# Patient Record
Sex: Female | Born: 1940 | Race: White | Hispanic: No | Marital: Married | Smoking: Never smoker
Health system: Southern US, Community
[De-identification: ages and names within clinical notes are randomized; demographics above are authoritative.]

## PROBLEM LIST (undated history)

## (undated) DIAGNOSIS — I428 Other cardiomyopathies: Secondary | ICD-10-CM

## (undated) DIAGNOSIS — F0390 Unspecified dementia without behavioral disturbance: Secondary | ICD-10-CM

## (undated) DIAGNOSIS — I272 Pulmonary hypertension, unspecified: Secondary | ICD-10-CM

## (undated) DIAGNOSIS — I499 Cardiac arrhythmia, unspecified: Secondary | ICD-10-CM

## (undated) DIAGNOSIS — G3184 Mild cognitive impairment, so stated: Secondary | ICD-10-CM

## (undated) DIAGNOSIS — E785 Hyperlipidemia, unspecified: Secondary | ICD-10-CM

## (undated) DIAGNOSIS — I10 Essential (primary) hypertension: Secondary | ICD-10-CM

## (undated) DIAGNOSIS — F418 Other specified anxiety disorders: Secondary | ICD-10-CM

## (undated) DIAGNOSIS — R42 Dizziness and giddiness: Secondary | ICD-10-CM

## (undated) HISTORY — DX: Other cardiomyopathies: I42.8

## (undated) HISTORY — PX: APPENDECTOMY: SHX54

## (undated) HISTORY — DX: Hyperlipidemia, unspecified: E78.5

## (undated) HISTORY — PX: ABDOMINAL HYSTERECTOMY: SHX81

## (undated) HISTORY — PX: CHOLECYSTECTOMY: SHX55

## (undated) HISTORY — DX: Other specified anxiety disorders: F41.8

## (undated) HISTORY — DX: Pulmonary hypertension, unspecified: I27.20

## (undated) HISTORY — DX: Essential (primary) hypertension: I10

## (undated) HISTORY — PX: EYE SURGERY: SHX253

---

## 1998-10-26 ENCOUNTER — Other Ambulatory Visit: Admission: RE | Admit: 1998-10-26 | Discharge: 1998-10-26 | Payer: Self-pay | Admitting: Family Medicine

## 2000-11-17 ENCOUNTER — Other Ambulatory Visit: Admission: RE | Admit: 2000-11-17 | Discharge: 2000-11-17 | Payer: Self-pay | Admitting: Family Medicine

## 2001-02-12 ENCOUNTER — Ambulatory Visit: Admission: RE | Admit: 2001-02-12 | Discharge: 2001-02-12 | Payer: Self-pay | Admitting: Pulmonary Disease

## 2001-04-08 ENCOUNTER — Ambulatory Visit (HOSPITAL_COMMUNITY): Admission: RE | Admit: 2001-04-08 | Discharge: 2001-04-08 | Payer: Self-pay | Admitting: *Deleted

## 2002-01-01 ENCOUNTER — Ambulatory Visit (HOSPITAL_COMMUNITY): Admission: RE | Admit: 2002-01-01 | Discharge: 2002-01-01 | Payer: Self-pay | Admitting: Gastroenterology

## 2002-01-01 ENCOUNTER — Encounter: Payer: Self-pay | Admitting: Gastroenterology

## 2002-04-13 ENCOUNTER — Other Ambulatory Visit: Admission: RE | Admit: 2002-04-13 | Discharge: 2002-04-13 | Payer: Self-pay | Admitting: Family Medicine

## 2002-09-20 ENCOUNTER — Encounter: Payer: Self-pay | Admitting: Gastroenterology

## 2002-09-20 ENCOUNTER — Encounter: Admission: RE | Admit: 2002-09-20 | Discharge: 2002-09-20 | Payer: Self-pay | Admitting: Gastroenterology

## 2003-05-23 ENCOUNTER — Other Ambulatory Visit: Admission: RE | Admit: 2003-05-23 | Discharge: 2003-05-23 | Payer: Self-pay | Admitting: Family Medicine

## 2003-10-26 ENCOUNTER — Encounter: Admission: RE | Admit: 2003-10-26 | Discharge: 2003-10-26 | Payer: Self-pay | Admitting: Gastroenterology

## 2004-09-20 ENCOUNTER — Ambulatory Visit (HOSPITAL_COMMUNITY): Admission: RE | Admit: 2004-09-20 | Discharge: 2004-09-20 | Payer: Self-pay | Admitting: Orthopedic Surgery

## 2005-02-21 ENCOUNTER — Ambulatory Visit: Payer: Self-pay | Admitting: Internal Medicine

## 2005-03-12 ENCOUNTER — Ambulatory Visit: Payer: Self-pay | Admitting: Internal Medicine

## 2005-08-26 ENCOUNTER — Ambulatory Visit: Payer: Self-pay | Admitting: Internal Medicine

## 2006-02-26 ENCOUNTER — Ambulatory Visit: Payer: Self-pay | Admitting: Internal Medicine

## 2006-04-24 ENCOUNTER — Ambulatory Visit: Payer: Self-pay | Admitting: Internal Medicine

## 2006-06-06 ENCOUNTER — Encounter (INDEPENDENT_AMBULATORY_CARE_PROVIDER_SITE_OTHER): Payer: Self-pay | Admitting: Specialist

## 2006-06-06 ENCOUNTER — Ambulatory Visit (HOSPITAL_COMMUNITY): Admission: RE | Admit: 2006-06-06 | Discharge: 2006-06-06 | Payer: Self-pay | Admitting: General Surgery

## 2006-09-29 ENCOUNTER — Ambulatory Visit: Payer: Self-pay | Admitting: Internal Medicine

## 2006-11-18 LAB — HM COLONOSCOPY

## 2007-08-05 ENCOUNTER — Ambulatory Visit: Payer: Self-pay | Admitting: Cardiology

## 2007-08-10 ENCOUNTER — Encounter: Payer: Self-pay | Admitting: Cardiology

## 2007-08-10 ENCOUNTER — Ambulatory Visit: Payer: Self-pay

## 2008-04-15 ENCOUNTER — Ambulatory Visit: Payer: Self-pay | Admitting: Internal Medicine

## 2008-10-24 ENCOUNTER — Ambulatory Visit: Payer: Self-pay | Admitting: Internal Medicine

## 2008-10-31 ENCOUNTER — Telehealth (INDEPENDENT_AMBULATORY_CARE_PROVIDER_SITE_OTHER): Payer: Self-pay | Admitting: *Deleted

## 2008-11-01 ENCOUNTER — Encounter: Payer: Self-pay | Admitting: Cardiology

## 2008-11-01 ENCOUNTER — Ambulatory Visit: Payer: Self-pay

## 2008-11-01 ENCOUNTER — Encounter: Payer: Self-pay | Admitting: Internal Medicine

## 2009-02-14 ENCOUNTER — Telehealth: Payer: Self-pay | Admitting: Internal Medicine

## 2009-07-21 ENCOUNTER — Ambulatory Visit: Payer: Self-pay | Admitting: Internal Medicine

## 2009-07-21 DIAGNOSIS — E7800 Pure hypercholesterolemia, unspecified: Secondary | ICD-10-CM | POA: Insufficient documentation

## 2009-07-21 DIAGNOSIS — R079 Chest pain, unspecified: Secondary | ICD-10-CM | POA: Insufficient documentation

## 2010-01-12 ENCOUNTER — Emergency Department (HOSPITAL_COMMUNITY): Admission: EM | Admit: 2010-01-12 | Discharge: 2010-01-13 | Payer: Self-pay | Admitting: Emergency Medicine

## 2010-08-08 LAB — HM DEXA SCAN: HM Dexa Scan: NORMAL

## 2010-08-16 ENCOUNTER — Encounter: Payer: Self-pay | Admitting: Internal Medicine

## 2010-08-16 ENCOUNTER — Ambulatory Visit: Payer: Self-pay | Admitting: Internal Medicine

## 2010-08-16 DIAGNOSIS — I428 Other cardiomyopathies: Secondary | ICD-10-CM

## 2010-08-23 ENCOUNTER — Telehealth: Payer: Self-pay | Admitting: Internal Medicine

## 2010-08-23 LAB — CONVERTED CEMR LAB
Cholesterol: 209 mg/dL — ABNORMAL HIGH (ref 0–200)
HDL: 48.2 mg/dL (ref >39.00)

## 2010-09-16 LAB — CONVERTED CEMR LAB
Alkaline Phosphatase: 55 units/L (ref 39–117)
Bilirubin, Direct: 0 mg/dL (ref 0.0–0.3)
Chloride: 103 meq/L (ref 96–112)
LDL Cholesterol: 88 mg/dL (ref 0–99)
Sodium: 143 meq/L (ref 135–145)
Total Bilirubin: 1.5 mg/dL — ABNORMAL HIGH (ref 0.3–1.2)
Total CHOL/HDL Ratio: 3
Total Protein: 7.4 g/dL (ref 6.0–8.3)
Triglycerides: 87 mg/dL (ref 0.0–149.0)

## 2010-09-20 NOTE — Assessment & Plan Note (Signed)
Summary: yearly/sl   History of Present Illness: Patient is a 70 year old with a history of hypertension, dyslipidemia, mild LV dysfunction.  I saw her 1 year ago.  Note, she had a myoview in 2010  that showed no ischeima.  LVEF was  50 to 55%. Since I saw her in 2010 she has noted some mild epigastric pain that has been constant.  Mild pleuritic component.   Does note some reflux as well. Otherwise, no other chest pains.  Breahting is good.  No palpitatons.  Current Medications (verified): 1)  Diovan 80 Mg Tabs (Valsartan) .Marland Kitchen.. 1 By Mouth Daiy 2)  Furosemide 20 Mg Tabs (Furosemide) .Marland Kitchen.. 1 By Mouth Daily 3)  Simvastatin 40 Mg Tabs (Simvastatin) .Marland Kitchen.. 1 By Mouth Daily 4)  Vitamin D 1000 Unit Tabs (Cholecalciferol) .Marland Kitchen.. 1 By Mouth Daily 5)  Aspirin 81 Mg  Tabs (Aspirin) .Marland Kitchen.. 1 By Mouth Daily  Allergies (verified): No Known Drug Allergies  Past History:  Past medical, surgical, family and social histories (including risk factors) reviewed, and no changes noted (except as noted below).  Past Medical History: Nonischimic cardiomyopaty Normal coronary arteries on cath 2002dyslipidemia Echo 2010:  LVEF 50 to 55%. hypertension GERD  Past Surgical History: Choley  Family History: Reviewed history and no changes required.  Social History: Reviewed history and no changes required. No tobacco.  Review of Systems       All systems reviewed.  NEg to the above problem except as noted above.  Vital Signs:  Patient profile:   70 year old female Height:      62 inches Weight:      151 pounds BMI:     27.72 Pulse rate:   64 / minute Resp:     16 per minute BP sitting:   122 / 70  (right arm)  Vitals Entered By: Marrion Coy, CNA (August 16, 2010 10:15 AM)  Physical Exam  Additional Exam:  Patient is in NAD HEENT:  Normocephalic, atraumatic. EOMI, PERRLA.  Neck: JVP is normal. No thyromegaly. No bruits.  Lungs: clear to auscultation. No rales no wheezes.  Heart: Regular rate  and rhythm. Normal S1, S2. No S3.   No significant murmurs. PMI not displaced.  Abdomen:  Supple.  Mild epigastric tenderness. Normal bowel sounds. No masses. No hepatomegaly.  Extremities:   Good distal pulses throughout. No lower extremity edema.  Musculoskeletal :moving all extremities.  Neuro:   alert and oriented x3.    EKG  Procedure date:  08/16/2010  Findings:      NSR.  64 bpm   Low voltage.  Septal MI  Impression & Recommendations:  Problem # 1:  CARDIOMYOPATHY (ICD-425.4) LVEF was low normal on echo in 2010.  I would keep on same regimen.  Check labs today.  Exam is good Her updated medication list for this problem includes:    Diovan 80 Mg Tabs (Valsartan) .Marland Kitchen... 1 by mouth daiy    Furosemide 20 Mg Tabs (Furosemide) .Marland Kitchen... 1 by mouth daily    Aspirin 81 Mg Tabs (Aspirin) .Marland Kitchen... 1 by mouth daily  Her updated medication list for this problem includes:    Diovan 80 Mg Tabs (Valsartan) .Marland Kitchen... 1 by mouth daiy    Furosemide 20 Mg Tabs (Furosemide) .Marland Kitchen... 1 by mouth daily    Aspirin 81 Mg Tabs (Aspirin) .Marland Kitchen... 1 by mouth daily  Problem # 2:  HYPERTENSION, BENIGN (ICD-401.1)  Adeq control Her updated medication list for this problem includes:  Diovan 80 Mg Tabs (Valsartan) .Marland Kitchen... 1 by mouth daiy    Furosemide 20 Mg Tabs (Furosemide) .Marland Kitchen... 1 by mouth daily    Aspirin 81 Mg Tabs (Aspirin) .Marland Kitchen... 1 by mouth daily  Her updated medication list for this problem includes:    Diovan 80 Mg Tabs (Valsartan) .Marland Kitchen... 1 by mouth daiy    Furosemide 20 Mg Tabs (Furosemide) .Marland Kitchen... 1 by mouth daily    Aspirin 81 Mg Tabs (Aspirin) .Marland Kitchen... 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 Her updated medication list for this problem includes:    Simvastatin 40 Mg Tabs (Simvastatin) .Marland Kitchen... 1 by mouth daily  Orders: TLB-Lipid Panel (80061-LIPID) TLB-AST (SGOT) (84450-SGOT)  Her updated medication list for this problem includes:    Simvastatin 40 Mg Tabs  (Simvastatin) .Marland Kitchen... 1 by mouth daily  Problem # 4:  CHEST PAIN UNSPECIFIED (ICD-786.50) APpears non cardiac.  ? GI  ? muscular.  Rx with omeprazole. Her updated medication list for this problem includes:    Aspirin 81 Mg Tabs (Aspirin) .Marland Kitchen... 1 by mouth daily  Orders: EKG w/ Interpretation (93000)  Patient Instructions: 1)  Your physician recommends that you return for a FASTING lipid profile: lipid and ast today...we will call you with results 2)  Your physician wants you to follow-up in: 12 months  You will receive a reminder letter in the mail two months in advance. If you don't receive a letter, please call our office to schedule the follow-up appointment. Prescriptions: OMEPRAZOLE 40 MG CPDR (OMEPRAZOLE) 1 every day  #30 x 11   Entered by:   Layne Benton, RN, BSN   Authorized by:   Sherrill Raring, MD, [facility]   Signed by:   Layne Benton, RN, BSN on 08/16/2010   Method used:   Electronically to        [facility] (retail)       [address]       [place], [place]  [address]       Ph: [phone]       Fax: [phone]   RxID:   [id number]

## 2010-09-20 NOTE — Progress Notes (Signed)
Summary: pt rtn call  Phone Note Call from Patient Call back at [phone]   Caller: Patient Reason for Call: Talk to Nurse, Talk to Doctor Summary of Call: pt rtn call  Initial call taken by: Omer Jack,  August 23, 2010 2:41 PM  Follow-up for Phone Call        Phone Call Completed PT AWARE WILL TRY CRESTOR AND WILL HAVE REPEAT LABS DONE  BEGINNING OF MARCH 2012. Follow-up by: Scherrie Bateman, LPN,  August 23, 2010 2:49 PM

## 2010-10-22 ENCOUNTER — Other Ambulatory Visit: Payer: Self-pay | Admitting: Internal Medicine

## 2010-10-22 ENCOUNTER — Encounter: Payer: Self-pay | Admitting: Internal Medicine

## 2010-10-22 ENCOUNTER — Other Ambulatory Visit (INDEPENDENT_AMBULATORY_CARE_PROVIDER_SITE_OTHER): Payer: Medicare Other

## 2010-10-22 DIAGNOSIS — Z79899 Other long term (current) drug therapy: Secondary | ICD-10-CM

## 2010-10-22 DIAGNOSIS — E785 Hyperlipidemia, unspecified: Secondary | ICD-10-CM

## 2010-10-22 LAB — LIPID PANEL
Cholesterol: 119 mg/dL (ref 0–200)
LDL Cholesterol: 58 mg/dL (ref 0–99)
Total CHOL/HDL Ratio: 3
Triglycerides: 73 mg/dL (ref 0.0–149.0)
VLDL: 14.6 mg/dL (ref 0.0–40.0)

## 2010-11-21 ENCOUNTER — Telehealth: Payer: Self-pay | Admitting: Internal Medicine

## 2010-11-21 NOTE — Telephone Encounter (Signed)
Pt needs crestor and  Omeprazole for 90 day supply [facility]

## 2010-11-23 ENCOUNTER — Other Ambulatory Visit: Payer: Self-pay

## 2010-11-23 MED ORDER — ROSUVASTATIN CALCIUM 10 MG PO TABS: 10.0000 mg | ORAL_TABLET | Freq: Every day | ORAL | Status: DC

## 2010-11-23 MED ORDER — OMEPRAZOLE 40 MG PO CPDR: 40.0000 mg | DELAYED_RELEASE_CAPSULE | Freq: Every day | ORAL | Status: DC

## 2010-11-28 ENCOUNTER — Other Ambulatory Visit: Payer: Self-pay | Admitting: *Deleted

## 2010-11-28 MED ORDER — OMEPRAZOLE 40 MG PO CPDR: 40.0000 mg | DELAYED_RELEASE_CAPSULE | Freq: Every day | ORAL | Status: DC

## 2010-12-12 ENCOUNTER — Encounter: Payer: Self-pay | Admitting: Nurse Practitioner

## 2010-12-12 DIAGNOSIS — F418 Other specified anxiety disorders: Secondary | ICD-10-CM | POA: Insufficient documentation

## 2010-12-12 DIAGNOSIS — I272 Pulmonary hypertension, unspecified: Secondary | ICD-10-CM | POA: Insufficient documentation

## 2010-12-12 DIAGNOSIS — G43909 Migraine, unspecified, not intractable, without status migrainosus: Secondary | ICD-10-CM | POA: Insufficient documentation

## 2011-01-01 NOTE — Assessment & Plan Note (Signed)
[facility]                            CARDIOLOGY OFFICE NOTE   NAME:[name], Kelly TRICK                     MRN:          [id number]  DATE:04/15/2008                            DOB:          03-Jul-1941    IDENTIFICATION:  Ms. Heacox is a 70 year old woman I last saw her back  in December of last year.  Actually, she was seen last by Dr. Rollene Rotunda.  I saw her in the February before and want to be seen more  locally.  She lives in [place].  She comes back today for continued  care.  Would like to be seen here in the future.   She denies chest pain, notes some shortness of breath with activity,  takes activities as tolerated.  No real change.   CURRENT MEDICINES:  1. Lasix 20.  2. Diovan 80.  3. Aspirin 81.  4. Omega-3.   Note, the patient was taken off for statins by Dr. Lindaann Pascal.   PHYSICAL EXAMINATION:  GENERAL:  The patient is in no distress.  VITAL SIGNS:  Blood pressure is 120/66, pulse is 64, weight 150, up 12  pounds from December.  NECK:  JVP is normal.  LUNGS:  Clear.  No rales.  CARDIAC:  Regular rate and rhythm.  S1 and S2.  No S3.  No significant  murmurs.  ABDOMEN:  No hepatomegaly.  Supple.  EXTREMITIES:  Good pulses.  No significant edema.   A 12-lead EKG, normal sinus rhythm, 64 beats per minute, low voltage.   IMPRESSION:  1. Left ventricular dysfunction.  Last echocardiogram left ventricular      ejection fraction was 50%, doing well on medical therapy.  2. Dyslipidemia.  With her left ventricular being minimally down, I      would favor treating.  I have given a prescription for Zocor.  She      did well as Vytorin, did not tolerate other statins.  Her insurance      will not pay for Vytorin.  We will go ahead and check lipids in      about 8-10 weeks.   I will set follow up otherwise for this summer, sooner if problems  develop.     Pricilla Riffle, MD, [facility]  Electronically Signed    PVR/MedQ  DD:  04/15/2008  DT: 04/16/2008  Job #: [id number]   cc:   Dr. Lindaann Pascal

## 2011-01-01 NOTE — Assessment & Plan Note (Signed)
[facility]                            CARDIOLOGY OFFICE NOTE   NAME:[name], [name]                     MRN:          [id number]  DATE:08/05/2007                            DOB:          May 27, 1941    PRIMARY CARE PHYSICIAN:  Kelly [name].   REASON FOR PRESENTATION:  Evaluate patient with cardiomyopathy.   HISTORY OF PRESENT ILLNESS:  The patient is a 70 year old, who was seen  in the past by Dr. Gerri [name] and then by Dr. Tenny [name] for shortness of  breath.  She was noted in the past to have an ejection fraction of 45 to  50%.  However, cardiac catheterization demonstrated normal coronaries.  She did have mildly elevated pulmonary pressures.  She was last followed  with an echocardiogram in 2005.  This demonstrated the EF to be about  50%.  There was mild aortic sclerosis.  There were no significant  valvular abnormalities.  Her TR velocity was 2.1 suggesting actual  normal pulmonary pressures.   The patient is now following up here as she lives here.  She says that  she will occasionally get some shortness of breath.  She will notice  this sometimes in the morning.  However, this is not a problem with  activity.  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  She rides  the lawnmower, but she still has to do some lifting and other things.  She does not get particularly short of breath with this and does not  describe any PND or orthopnea.  She does not have any chest discomfort,  neck or arm discomfort.  She has had no palpitations and no presyncope  or syncope.   PAST MEDICAL HISTORY:  1. Nonischemic cardiomyopathy (EF about 50%).  2. 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  3. Hypertension.  4. Dyslipidemia.  5. Migraine headaches.  6. TAH.  7. Oophorectomy.  8. Appendectomy.  9. Cataract surgery.  10.Sebaceous cyst resected.  11.Cholecystectomy.   ALLERGIES/INTOLERANCES:  None.   MEDICATIONS:  1. Lasix 20 mg daily.  2. Diovan 80 mg  daily.  3. Aspirin 81 mg daily.  4. Crestor 10 mg daily.  5. Lexapro 10 mg daily.   REVIEW OF SYSTEMS:  As stated in the HPI and otherwise negative for  other systems.   PHYSICAL EXAMINATION:  GENERAL:  The patient is in no distress.  VITAL SIGNS:  Blood pressure 120/70, heart rate is 70 and regular.  HEENT:  Eyelids unremarkable, pupils equal, round, and react to light,  fundi not visualized, oral mucosa unremarkable.  NECK:  No jugulovenous distention at 45 degrees, carotid upstroke brisk  and symmetric, no bruits, no thyromegaly.  LYMPHATICS:  No cervical, axillary, or inguinal adenopathy.  LUNGS:  Clear to auscultation bilaterally.  BACK:  No costovertebral angle tenderness.  CHEST:  Unremarkable.  HEART:  PMI nondisplaced or sustained, S1 and S2 within normal limits,  no S3, no S4, no clicks, no rubs, no murmurs.  ABDOMEN:  Flat, positive bowel sounds normal in frequency and pitch, no  bruits, no rebound, no guarding, no midline  pulsatile mass, no  organomegaly.  SKIN:  No rashes, no nodules.  EXTREMITIES:  2+ pulses, no edema.   EKG:  Sinus rhythm, rate 77, low voltage in the limb leads, axis within  normal limits, poor anterior R-wave progression, cannot exclude an old  anteroseptal infarct, no acute ST-T wave changes.   ASSESSMENT AND PLAN:  1. Cardiomyopathy.  The patient did have a mild cardiomyopathy.  She      does have some occasional dyspnea.  She also had some mild      pulmonary hypertension by catheterization.  To follow up this, we      will get an echocardiogram though I do not clinically suspect a      significant change.  I do note that she has the electrocardiogram      with the findings as above.  However, these are not significantly      changed from previous.  2. Dyslipidemia per Kelly [name].  3. Hypertension.  Her blood pressure is well-controlled and she will      continue the medications as listed.  4. Followup.  I would like to see her yearly or as  needed based on the      results of the above testing.     Kelly Rotunda, MD, [facility]  Electronically Signed    JH/MedQ  DD: 08/05/2007  DT: 08/05/2007  Job #: [id number]   cc:   Kelly [name], Mr.

## 2011-01-01 NOTE — Assessment & Plan Note (Signed)
[facility]                            CARDIOLOGY OFFICE NOTE   NAME:[name], Kelly TAGLIAFERRO                     MRN:          [id number]  DATE:10/24/2008                            DOB:          02-04-1941    IDENTIFICATION:  Kelly [name] is a 19 (seem to be 55) year-old woman I  last saw her back in August.  She has a history of dyslipidemia and mild  LV dysfunction.   Since seen, she actually went to her primary physician and she described  feeling of more shortness of breath.  She has also had episodes of chest  pain, left-sided, sometimes radiating towards her back.  Nonpleuritic,  they can wake her from sleep.  She notes occasional reflux, but not  severe, takes Prilosec.  The pain can also occur at rest.  She does not  notice an association particularly with exertion, though she has again  with the weather cut back on her activity some.  She does say that she  just feels like she is getting older.   CURRENT MEDICINES:  1. Lasix 20.  2. Diovan 80.  3. Aspirin 81.  4. Simvastatin 40.   PHYSICAL EXAMINATION:  GENERAL:  The patient is in no distress.  VITAL SIGNS:  Blood pressure is 112/61; pulse is 81 and regular; weight  153, which is up 3 pounds from August.  NECK:  JVP is normal.  LUNGS:  Clear.  Moving air well.  No rales or wheezes.  CHEST:  There is some tenderness on the left side with palpation, but  this is not the pain that she is experiencing at night.  CARDIAC:  Regular rate and rhythm, S1-S2, no S3.  No significant  murmurs.  ABDOMEN:  Supple, nontender.  Normal bowel sounds.  EXTREMITIES:  Good distal pulses.  No significant edema.   A 12-lead EKG, low voltage.  Normal sinus rhythm at 76 beats per minute.  Possible anterior MI, these changes are old.   LABORATORIES:  From [facility], LDL of 64, HDL  of 56, and triglycerides 78.   IMPRESSION:  1. Chest pain, shortness of breath, atypical for coronary  artery      disease, but concerning is I cannot explain of anything else and      she has cut back on her activity.  Now she had normal coronary      arteries by cath in 2002.  Her LV function has improved on echo      back in 2008.   What I would recommend is that the patient to go ahead and do another  exercise treadmill.  In addition, we will get an echocardiogram to  define.  Continue current medicines for now.  1. Hypertension, good control.  2. Dyslipidemia, excellent numbers.   I will set follow up for 9 months, but again be in touch with her once I  have seen the test results.     Pricilla Riffle, MD, [facility]  Electronically Signed    PVR/MedQ  DD: 10/24/2008  DT: 10/24/2008  Job #: [id number]  cc:   [facility]

## 2011-01-04 NOTE — Assessment & Plan Note (Signed)
[facility]                              CARDIOLOGY OFFICE NOTE   NAME:Drumheller, JACKILYN UMPHLETT                     MRN:          [id number]  DATE:04/24/2006                            DOB:          12-Oct-1940    IDENTIFICATION:  Ms. Utke is a 70 year old woman with mild LV  dysfunction, LV of 50% back in 2005.  Note, cardiac catheterization in 2002  showed no significant CAD.  LVEF at the time was 55%.   I saw the patient back in July.  She was doing well.  I actually felt like  she could be discontinued from cardiology followup unless her symptoms  change.  She does need followup, though, for some mild dyslipidemia.  She  has been started on Vytorin.  When I saw her, she had some epigastric pain.  Indeed, the workup showed she has significant cholelithiasis, and the plan  is for cholecystectomy in the near future.   The patient has been active.  She denies chest pain.  Abdominal pain with  some foods.   CURRENT MEDICATIONS:  1. Aspirin, on hold.  2. Lasix 20 daily.  3. Diovan 80 daily.  4. Vytorin 10/10 daily.  5. Allegra daily.  6. Tagamet t.i.d.  7. Vistaril nightly.   PHYSICAL EXAMINATION:  VITAL SIGNS:  Blood pressure 110/60, pulse 80 and  regular.  Weight 143.  GENERAL:  Patient is in no distress.  LUNGS:  Clear.  NECK:  JVP is normal.  CARDIAC:  Regular rate and rhythm.  S1 and S2.  No S3.  ABDOMEN:  Benign.  EXTREMITIES:  No edema.  Pulses 2+.   IMPRESSION:  1. History of left ventricular dysfunction, really minimally depressed.      Would continue current regimen.  2. Dyslipidemia.  Begin Vytorin therapy.  I think this will be important      to minimize the risks for other cardiac problems developing.   I will set followup for July of this next year.  Again, we will follow her  lipids in the interval.  I think overall she is at low risk.  She is a class  I patient.  She should be able to proceed with surgery with very low risk  of  problems.  No further testing indicated.                                Pricilla Riffle, MD, [facility]    PVR/MedQ  DD:  04/24/2006  DT:  04/24/2006  Job #:  [id number]   cc:   Ernestina Penna, M.D.  Petra Kuba, M.D.  Cherylynn Ridges, M.D.

## 2011-01-04 NOTE — Cardiovascular Report (Signed)
[facility]  Patient:    Kelly [name], Kelly [name] Visit Number: [id number] MRN: [id number]          Service Type: CAT Location: [facility] 2899 [id number] Attending Physician:  Daisey Must Proc. Date: 04/08/01 Adm. Date:  04/08/2001   CC:         Monica Becton, M.D.  Cardiac Catheterization Laboratory   Cardiac Catheterization  PROCEDURES PERFORMED:  Right and left heart catheterization with coronary angiography and left ventriculography.  INDICATIONS:  The patient is a 70 year old woman, with a history of exertional dyspnea.  An echocardiogram showed ejection fraction of 45% with septal hypokinesis.  A stress Cardiolite showed no ischemia but confirmed an ejection fraction of approximately 44%.  Also by echocardiogram, she had suggestion of moderate pulmonary hypertension.  We opted to proceed with cardiac catheterization to assess the etiology of her left ventricular dysfunction and rule out coronary artery and assess the severity of her pulmonary hypertension.  DESCRIPTION OF PROCEDURE:  An 8 French sheath was placed in the right femoral vein, 6 French sheath in the right femoral artery.  Right heart catheterization was performed with a Swan-Ganz catheter.  Left heart catheterization was performed with Standard Judkins 6 French catheters. Contrast was Omnipaque.  There were no complications.  RESULTS:  HEMODYNAMICS:  Right atrial mean pressure 2, right ventricular pressure 34/6, pulmonary artery pressure 32/14.  Pulmonary capillary wedge pressures A 14, V 14, mean 10.  Left ventricular pressure 148/20.  Aortic pressure 146/70. There was no aortic valve gradient.  Cardiac output by the thermodilution method is 3.4, cardiac index 2.0. Cardiac output by the Fick method is 3.2.  Cardiac index 1.9.  LEFT VENTRICULOGRAM:  There is mild global hypokinesis.  Ejection fraction is calculated at 55%.  There is 2+ mild mitral regurgitation.  CORONARY  ARTERIOGRAPHY:  (Codominant).  Left main:  Normal.  Left anterior descending:  The left anterior descending artery gives rise to a large first diagonal branch.  The LAD is normal.  Left circumflex:  The left circumflex is a codominant vessel giving rise to a small ramus intermedius, a large branching obtuse marginal and three small posterolateral branches.  The left circumflex is angiographically normal.  Right coronary artery:  The right coronary artery is a relatively small codominant vessel ending as a small posterior descending artery.  The right coronary artery is angiographically normal.  IMPRESSIONS: 1. Pulmonary artery pressures at the upper limits of normal with normal    right and left heart filling pressures. 2. Mildly decreased cardiac output. 3. Mildly decreased left ventricular systolic function secondary to    nonischemic cardiomyopathy.  Mild mitral regurgitation. 4. Normal coronary arteries.  PLAN:  The patient will be continued on medical therapy. Attending Physician:  Daisey Must DD:  04/08/01 TD:  04/08/01 Job: [id number]

## 2011-01-04 NOTE — Op Note (Signed)
NAME:  Kelly [name], Kelly [name]              ACCOUNT NO.:  [id number]   MEDICAL RECORD NO.:  [id number]          PATIENT TYPE:  AMB   LOCATION:  SDS                          FACILITY:  [facility]   PHYSICIAN:  Cherylynn Ridges, M.D.    DATE OF BIRTH:  13-Mar-1941   DATE OF PROCEDURE:  DATE OF DISCHARGE:                                 OPERATIVE REPORT   PREOPERATIVE DIAGNOSIS:  Symptomatic cholelithiasis.   POSTOPERATIVE DIAGNOSIS:  Symptomatic cholelithiasis.   PROCEDURE:  Laparoscopic cholecystectomy with cholangiogram.   SURGEON:  Dr. Jimmye Norman   ANESTHESIA:  General endotracheal.   ESTIMATED BLOOD LOSS:  Less than 20 mL.   COMPLICATIONS:  None.   CONDITION:  Stable.   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  Some  omental adhesions to the gallbladder infundibulum and body.  Normal  cholangiogram with good flow into the duodenum.  No filling defects.  No  dilatation and good proximal flow.   OPERATION:  The patient was taken to the operating room and placed on the  table in the supine position.  After adequate endotracheal anesthetic was  administered, the patient was prepped and draped in the usual sterile  manner, exposing the midline of the right upper quadrant.   A supraumbilical curvilinear incision was made using a #11 blade and taken  down to the midline fascia.  It was through this fascia that we entered into  the peritoneal cavity with a Hasson cannula by tenting up the fascia with  Kocher clamps, incising in between the clamps, using a 15 blade and then  bluntly dissecting down to the peritoneal cavity.  A pursestring suture of 0  Vicryl was used to secure the Hasson cannula in place and then we  insufflated with carbon dioxide gas up to a maximal pressure of 15 mmHg.   Two right costal margin 5 mm cannulas and a subxiphoid 11-12 mm cannula were  passed under direct vision into the peritoneal cavity.  Once this was done,  we placed the patient in  reverse Trendelenburg, tilted down the left side.   We grasped the dome of the gallbladder, using a grasper through the lateral  most 5 mm cannula, retracted it towards the right upper quadrant.  We placed  the second one on the infundibulum after we had taken down the omental  adhesions with cautery and with blunt dissection.  We exposed the peritoneum  overlying the triangle of Calot and hepatoduodenal triangle and we were able  to dissect out the cystic duct and the cystic artery very clearly.  A window  was placed between the gallbladder bed, the cystic duct and artery.  Once we  had adequate windows, we clipped the gallbladder along its side and made a  cholecystodochotomy using laparoscopic scissors.  We did a cholangiogram  using a Cook catheter which had been passed through the anterior abdominal  wall and into the cholecystodochotomy.  This demonstrated a good flow into  the duodenum, no filling defects, no dilatation of the common duct and good  proximal flow.  Once the cholangiogram was  completed, we removed the cannula  and clipped the distal cystic duct with 3 clips and transected the cystic  duct.  The cystic artery along with the posterior branch were also clipped  along the remaining side with double clips and then transected.  We  dissected out the gallbladder from its bed with minimal difficulty,  obtaining hemostasis in the hepatic bed using electrocautery.  Once this was  done, we were able to bring out the gallbladder from the supraumbilical site  with minimal difficulty, using a large grasper.  We then used a pursestring  suture which was holding in the Hasson cannula to close off the  supraumbilical fascia.  We injected 0.25% Marcaine on all sides and closed  the skin using running subcuticular stitch of 4-0 Vicryl.  We aspirated  fluid and gas from above the liver prior to removing all cannulas.  Sterile  dressing was applied.  All needle counts, sponge counts and  instrument  counts were correct.      Cherylynn Ridges, M.D.  Electronically Signed     JOW/MEDQ  D:  06/06/2006  T:  06/07/2006  Job:  [id number]

## 2011-01-04 NOTE — Assessment & Plan Note (Signed)
[facility]                            CARDIOLOGY OFFICE NOTE   NAME:Kelly [name], Kelly [name]                     MRN:          [id number]  DATE:09/26/2006                            DOB:          02-15-1941    IDENTIFICATION:  Kelly [name] is a 70 year old woman, last seen in  cardiology clinic back in July, history of mild LV dysfunction.  Last  echocardiogram shows an LVF of 50%.  This was actually done in 2005.  She had been seen by Loraine Leriche Pulsipher previously.  Catheterization, 2002,  no significant CAD.   In the interval, she has done well.  She denies shortness of breath, no  chest pain.   CURRENT MEDICATIONS:  1. Lasix 20 daily.  2. Diovan 80 daily.  3. Aspirin 81 mg daily.  4. Vytorin 10/10 daily.   PHYSICAL EXAMINATION:  Patient is in no distress.  Blood pressure  122/71, pulse is 70 and regular, weight 144.  NECK:  JVP is normal.  LUNGS:  Clear.  CARDIAC EXAM:  Regular rate and rhythm, S1, S2, no S3, no murmurs.  ABDOMEN:  Benign.  EXTREMITIES:  No edema.   A 12-lead EKG, normal sinus rhythm, 67 beats per minute.  Poor R-wave  progression.   IMPRESSION:  1. Mild left ventricular dysfunction.  Again, left ventricular      function was 50 on last check a couple of years ago.  Clinically,      she is doing well, I would not repeat.  2. Dyslipidemia.  Will need to check lipids on Vytorin therapy.   The patient should be seen periodically.  She would like to be followed  closer to home, and, I think, in [place] it would be fine.  She is  followed in [facility] by Lindaann Pascal.  I will  set followup for there in the fall.     Pricilla Riffle, MD, [facility]  Electronically Signed    PVR/MedQ  DD: 09/29/2006  DT: 09/30/2006  Job #: [id number]   cc:   Lindaann Pascal, PAC

## 2011-01-04 NOTE — Assessment & Plan Note (Signed)
[facility]                              CARDIOLOGY OFFICE NOTE   NAME:Pentland, RONDALYN BELFORD                     MRN:          [id number]  DATE:02/26/2006                            DOB:          [birth year]    IDENTIFICATION:  Ms. Nolden is a 70 year old woman whom I saw back in July  of last year.  She has a history of nonischemic cardiomyopathy.  Last  echocardiogram showed LVF of 50% back in 2005.  Note, she had cardiac  catheterization in 2002 that showed no significant CAD.   In the interval, she has done well.  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  Notes no increased  shortness of breath.   About three weeks ago she had some bloating in the epigastric area, upper  stomach to mid abdomen.  She took Prevacid over-the-counter.  May have  helped some.   Denies chest pain otherwise.   CURRENT MEDICATIONS:  1.  Aspirin 81 mg daily.  2.  Lasix 20 mg daily.  3.  Diovan 80 mg daily.  4.  Allegra daily.  5.  Tagamet t.i.d. p.r.n.  6.  Vistaril q.h.s.   PHYSICAL EXAMINATION:  GENERAL:  The patient is in no distress.  VITAL SIGNS:  Blood pressure 124/76, pulse 62 and regular, weight 140.  NECK:  JVP is normal, no bruits.  LUNGS:  Clear.  CARDIOVASCULAR:  Regular rate and rhythm, S1 and S2, no S3.  No significant  murmurs.  ABDOMEN:  Benign with epigastric tenderness to palpation.  No masses.  Otherwise abdominal exam benign.  EXTREMITIES:  Good distal pulses, no edema.   IMPRESSION:  1.  Nonischemic cardiomyopathy with minimally decreased ejection fraction by      echo a few years ago, clinically doing very well.  I would keep her on      the current regimen and not change anything for now.  EF had been 45% in      the past, now about 50 by last echo, again, would not change treatment.  2.  Epigastric pain.  Pain reproduced some by palpation of her epigastrium.      Whether she stretched  something, I am not sure.  No masses.  This does      not appear to be cardiovascular.  3.  Health care maintenance.  The patient is no longer on lipid-lowering      agent. Will try to get this from her primary physician and be in touch      with her.   She is followed by Lindaann Pascal in clinic.  Lives in [place]; would like to do  things more locally.  It is not unreasonable to be seen there.  If her  symptoms change or worsen, I would be happy to see her.  Would not schedule  any periodic testing unless there is a clinical change.   I would aggressively control risk factors though on her with family history  of coronary disease.  I will be in touch with  her once I have seen the  lipids.                                Pricilla Riffle, MD, [facility]    PVR/MedQ  DD:  02/26/2006  DT:  02/26/2006  Job #:  [id number]   cc:   Lindaann Pascal, M.D.

## 2011-01-04 NOTE — Procedures (Signed)
[place]  Patient:    SHERIDAN, HEW Visit Number: [id number] MRN: [id number]          Service Type: END Location: ENDO Attending Physician:  Nelda Marseille Dictated by:   Petra Kuba, M.D. Proc. Date: 01/01/02 Admit Date:  01/01/2002 Discharge Date: 01/01/2002   CC:         Monica Becton, M.D.   Procedure Report  PROCEDURE PERFORMED:  Colonoscopy.  INDICATIONS:  For screening. Consent was signed after the risks, benefits, methods and options were thoroughly discussed with both the patient and her husband.  MEDICINES USED:  Demerol 75 mg and Versed 7.5 mg.  INTRAOPERATIVE FINDINGS:  Rectal inspection was pertinent for external hemorrhoids, small. Digital exam is negative.  SCOPE USED:  Pediatric video adjustable colonoscope.  DESCRIPTION OF PROCEDURE:  The pediatric video adjustable colonoscope was inserted and easily advanced to the level of the splenic flexure. At that point the scope began to loop and rolling her on her back and on her right side, we were able to advance to the hepatic flexure. Unfortunately, with increased advancement, we continued to loop. We tried to withdraw the scope to remove the loops and roll her back on her back and then even back on her left side, but could not advance any further, so we elected to withdraw. On slow withdrawal through the colon, there was a questionable tiny sigmoid descending junction polyp which was not biopsied, but photo documentation was obtained. It was tiny without any worrisome features, probably even hyperplastic. No other abnormalities were seen as we slowly withdrew back to the rectum. The prep was adequate. There was some liquid stool that required washing and suctioning. Back in the rectum the scope was then retroflexed pertinent for some internal hemorrhoids. The scope was straightened, the air was suctioned and the scope was removed. The patient tolerated  the procedure well. There was no obvious immediate complication.  ENDOSCOPIC DIAGNOSES: 1. Internal hemorrhoids and external hemorrhoids. 2. Questionable tiny sigmoid descending junction polyp, not biopsied. 3. Otherwise, within normal limits to questionably the hepatic flexure -    unable to advance probably secondary to adhesions.  PLAN:  Go ahead and get an air contrast barium enema and pending those findings, decided future colonic screening, otherwise, yearly rectals and guaiacs per Dr. Christell Constant. Dictated by:   Petra Kuba, M.D. Attending Physician:  Nelda Marseille DD:  01/01/02 TD:  01/04/02 Job: [id number]

## 2011-01-17 ENCOUNTER — Other Ambulatory Visit: Payer: Self-pay | Admitting: Internal Medicine

## 2011-02-26 ENCOUNTER — Other Ambulatory Visit: Payer: Self-pay | Admitting: Internal Medicine

## 2011-06-24 ENCOUNTER — Telehealth: Payer: Self-pay | Admitting: Internal Medicine

## 2011-06-24 NOTE — Telephone Encounter (Signed)
Pt needs diovan 80 mg and omeprazole 40mg  wants 90 day supply, uses [facility] [employer]

## 2011-06-26 ENCOUNTER — Other Ambulatory Visit: Payer: Self-pay

## 2011-06-26 MED ORDER — VALSARTAN 80 MG PO TABS: 80.0000 mg | ORAL_TABLET | Freq: Every day | ORAL | Status: DC

## 2011-06-26 MED ORDER — OMEPRAZOLE 40 MG PO CPDR: 40.0000 mg | DELAYED_RELEASE_CAPSULE | Freq: Every day | ORAL | Status: DC

## 2011-07-18 NOTE — Telephone Encounter (Signed)
Please close enounter 

## 2011-08-08 ENCOUNTER — Ambulatory Visit: Payer: Medicare Other | Admitting: Internal Medicine

## 2011-09-06 ENCOUNTER — Encounter: Payer: Self-pay | Admitting: *Deleted

## 2011-09-09 ENCOUNTER — Ambulatory Visit (INDEPENDENT_AMBULATORY_CARE_PROVIDER_SITE_OTHER): Payer: Medicare Other | Admitting: Internal Medicine

## 2011-09-09 ENCOUNTER — Encounter: Payer: Self-pay | Admitting: Internal Medicine

## 2011-09-09 VITALS — BP 129/69 | HR 76 | Ht 62.0 in | Wt 146.0 lb

## 2011-09-09 DIAGNOSIS — I428 Other cardiomyopathies: Secondary | ICD-10-CM

## 2011-09-09 DIAGNOSIS — E7800 Pure hypercholesterolemia, unspecified: Secondary | ICD-10-CM

## 2011-09-09 DIAGNOSIS — I509 Heart failure, unspecified: Secondary | ICD-10-CM

## 2011-09-09 NOTE — Assessment & Plan Note (Addendum)
Lipids were excellent last year.  Need to clarify if still taking the Crestor.  Should be on.

## 2011-09-09 NOTE — Assessment & Plan Note (Signed)
LVEF is 50 to 55%  Volume looks good.  I would keep on same regimen.  Check BMET today.

## 2011-09-09 NOTE — Progress Notes (Signed)
HPI Patient is a 71 year old with a history of hypertension, dyslipidemia, mild LV dysfunction. I saw her 1 year ago. Note, she had a myoview in 2010 that showed no ischeima. LVEF was 50 to 55%.  I saw he in clinic last in December 2011.  Since seen has been doing well.  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    Allergies  Allergen Reactions  . Codeine Nausea And Vomiting  . Simvastatin     Current Outpatient Prescriptions  Medication Sig Dispense Refill  . aspirin 81 MG tablet Take 81 mg by mouth daily.      . cholecalciferol (VITAMIN D) 1000 UNITS tablet Take 1,000 Units by mouth daily.      . furosemide (LASIX) 20 MG tablet TAKE ONE TABLET BY MOUTH EVERY DAY  90 tablet  3  . Multiple Vitamin (MULTIVITAMIN) tablet Take 1 tablet by mouth daily.      Marland Kitchen omeprazole (PRILOSEC) 40 MG capsule Take 1 capsule (40 mg total) by mouth daily.  90 capsule  3  . valsartan (DIOVAN) 80 MG tablet Take 1 tablet (80 mg total) by mouth daily.  90 tablet  3    Past Medical History  Diagnosis Date  . Pure hypercholesterolemia   . Migraine   . HYPERTENSION, BENIGN   . CARDIOMYOPATHY   . Pulmonary HTN   . Depression with anxiety   . CHEST PAIN UNSPECIFIED     No past surgical history on file.  No family history on file.  History   Social History  . Marital Status: Married    Spouse Name: N/A    Number of Children: N/A  . Years of Education: N/A   Occupational History  . Not on file.   Social History Main Topics  . Smoking status: Never Smoker   . Smokeless tobacco: Never Used  . Alcohol Use: No  . Drug Use: No  . Sexually Active: Not on file   Other Topics Concern  . Not on file   Social History Narrative  . No narrative on file    Review of Systems:  All systems reviewed.  They are negative to the above problem except as previously stated.  Vital Signs: BP 129/69  Pulse 76  Ht 5\' 2"  (1.575 m)  Wt 146 lb (66.225 kg)  BMI 26.70 kg/m2  Physical Exam Patient is in  NAD HEENT:  Normocephalic, atraumatic. EOMI, PERRLA.  Neck: JVP is normal. No thyromegaly. No bruits.  Lungs: clear to auscultation. No rales no wheezes.  Heart: Regular rate and rhythm. Normal S1, S2. No S3.   No significant murmurs. PMI not displaced.  Abdomen:  Supple, nontender. Normal bowel sounds. No masses. No hepatomegaly.  Extremities:   Good distal pulses throughout. No lower extremity edema.  Musculoskeletal :moving all extremities.  Neuro:   alert and oriented x3.  CN II-XII grossly intact.  EKG:  Sinus rhythm.  73 bpm.  Septal MI.  Low voltage.   Assessment and Plan:

## 2011-09-09 NOTE — Patient Instructions (Signed)
Your physician wants you to follow-up in: 12 montYou will receive a reminder letter in the mail two months in advance. If you don't receive a letter, please call our office to schedule the follow-up appointment.   Lab work today. Will call you with results

## 2011-09-10 LAB — BASIC METABOLIC PANEL
BUN: 15 mg/dL (ref 6–23)
Potassium: 3.5 mEq/L (ref 3.5–5.1)
Sodium: 142 mEq/L (ref 135–145)

## 2011-09-27 ENCOUNTER — Telehealth: Payer: Self-pay | Admitting: *Deleted

## 2011-09-27 NOTE — Telephone Encounter (Addendum)
Called patient to discuss taking Lipid lowering medication. She is currently not taking a lipid lowering agent. She was seen in January and reported that she was not taking Crestor any longer because it was a 3rd tier drug and cost too much. She states that she recalls feeling OK on Simvastatin 20mg  every day but when the dose was increased to 40mg  every day she felt poorly. She does not remember ever taking Lipitor. Advised her to get a Lipid panel done at [facility] office in [facility] in March and forward to Dr.Ross for review. She agreed to above plan. Will let Dr.Ross know.

## 2011-09-30 ENCOUNTER — Other Ambulatory Visit: Payer: Self-pay | Admitting: Internal Medicine

## 2011-12-28 ENCOUNTER — Other Ambulatory Visit: Payer: Self-pay | Admitting: Internal Medicine

## 2011-12-30 ENCOUNTER — Other Ambulatory Visit (HOSPITAL_COMMUNITY): Payer: Self-pay | Admitting: *Deleted

## 2012-04-23 ENCOUNTER — Ambulatory Visit (INDEPENDENT_AMBULATORY_CARE_PROVIDER_SITE_OTHER)
Admission: RE | Admit: 2012-04-23 | Discharge: 2012-04-23 | Disposition: A | Discharge status: Home / Self Care | Payer: Medicare Other | Source: Ambulatory Visit | Attending: Physician Assistant | Admitting: Physician Assistant

## 2012-04-23 ENCOUNTER — Telehealth: Payer: Self-pay | Admitting: *Deleted

## 2012-04-23 ENCOUNTER — Ambulatory Visit (INDEPENDENT_AMBULATORY_CARE_PROVIDER_SITE_OTHER): Payer: Medicare Other | Admitting: Physician Assistant

## 2012-04-23 ENCOUNTER — Encounter: Payer: Self-pay | Admitting: Physician Assistant

## 2012-04-23 VITALS — BP 132/66 | HR 65 | Ht 62.0 in | Wt 138.6 lb

## 2012-04-23 DIAGNOSIS — I429 Cardiomyopathy, unspecified: Secondary | ICD-10-CM

## 2012-04-23 DIAGNOSIS — R079 Chest pain, unspecified: Secondary | ICD-10-CM

## 2012-04-23 DIAGNOSIS — I10 Essential (primary) hypertension: Secondary | ICD-10-CM

## 2012-04-23 DIAGNOSIS — K219 Gastro-esophageal reflux disease without esophagitis: Secondary | ICD-10-CM

## 2012-04-23 NOTE — Progress Notes (Signed)
[address] [place], [place]  [address] Phone: [phone] Fax:  [phone]  Date:  04/23/2012   Name:  Kelly [name]   DOB:  [birth year]   MRN:  [id number]  PCP:  No primary provider on file.  Primary Cardiologist:  Dr. Dietrich Pates  Primary Electrophysiologist:  None    History of Present Illness: Kelly [name] is a 71 y.o. female who returns for evaluation of chest pain.  She has a history of nonischemic cardiomyopathy with mild LV dysfunction, HTN and HL. She has a history of normal coronary arteries by catheterization in 2002. Last echo in 2010 demonstrated an EF of 50-55% and Myoview normal in 2010. Last seen by Dr. Tenny Craw 08/2011.  Over the last few weeks, she has noted a left sided chest discomfort that she describes as sharp or dull. It is not exertional.  She denies any associated shortness of breath, jaw pain, arm pain, nausea, diaphoresis. She occasionally feels as though she might be wheezing. She denies orthopnea, PND or edema. She denies syncope or near-syncope. She occasionally gets dizzy. She obtained a mammogram. This was reportedly normal.  Wt Readings from Last 3 Encounters:  09/09/11 146 lb (66.225 kg)  08/16/10 151 lb (68.493 kg)  07/21/09 149 lb (67.586 kg)     Past Medical History  Diagnosis Date  . HLD (hyperlipidemia)   . Migraine   . HTN (hypertension)   . NICM (nonischemic cardiomyopathy)     a. LHC 8/02: EF 55%, 2+ MR, normal cors;  b.  Echo 3/10: EF 50-55%, mild diast dysfn, mild MVP (ant leaflet), mild MR, mild TR, small pericard eff.;   c.  Myoview 3/10:  EF 69%, no ischemia  . Pulmonary HTN   . Depression with anxiety     Current Outpatient Prescriptions  Medication Sig Dispense Refill  . aspirin 81 MG tablet Take 81 mg by mouth daily.      . cholecalciferol (VITAMIN D) 1000 UNITS tablet Take 1,000 Units by mouth daily.      . furosemide (LASIX) 20 MG tablet TAKE ONE TABLET BY MOUTH EVERY DAY  90 tablet  3  .  Multiple Vitamin (MULTIVITAMIN) tablet Take 1 tablet by mouth daily.      Marland Kitchen omeprazole (PRILOSEC) 40 MG capsule TAKE ONE CAPSULE BY MOUTH EVERY DAY  30 capsule  3  . valsartan (DIOVAN) 80 MG tablet Take 1 tablet (80 mg total) by mouth daily.  90 tablet  3    Allergies: Allergies  Allergen Reactions  . Codeine Nausea And Vomiting  . Simvastatin     History  Substance Use Topics  . Smoking status: Never Smoker   . Smokeless tobacco: Never Used  . Alcohol Use: No     ROS:  Please see the history of present illness.  She has an occasional headaches. She also notes increased stress. She denies any cough.   All other systems reviewed and negative.   PHYSICAL EXAM: VS:  BP 132/66  Pulse 65  Ht 5\' 2"  (1.575 m)  Wt 138 lb 9.6 oz (62.869 kg)  BMI 25.35 kg/m2 Well nourished, well developed, in no acute distress HEENT: normal Neck: no JVD Cardiac:  normal S1, S2; RRR; no murmur Lungs:  clear to auscultation bilaterally, no wheezing, rhonchi or rales Abd: soft, nontender, no hepatomegaly Ext: no edema Skin: warm and dry Neuro:  CNs 2-12 intact, no focal abnormalities noted  EKG:  Sinus rhythm, heart rate 65, normal  axis, low voltage, occasional PVC, no change from prior trace     ASSESSMENT AND PLAN:  1. Chest Pain: Symptoms are somewhat atypical. She has a history of normal coronary arteries in the past. Last stress test in 2010 was normal. She does have a history of mitral valve prolapse. I have recommended proceeding with a chest x-ray, 2-D echocardiogram and ETT-echocardiogram. She has not taken Prilosec in a long time. I have recommended that she restart this once daily for the next 2 weeks. Followup in the next one to 2 months with Dr. Tenny Craw or sooner if her stress test is abnormal.  2. Hypertension: Controlled.  3. Nonischemic Cardiomyopathy: LV dysfunction in the past has been very mild. Repeat echo as noted. No symptoms of volume overload.  Luna Glasgow, PA-C  8:43  AM 04/23/2012

## 2012-04-23 NOTE — Telephone Encounter (Signed)
pt notified about CXR results w/verbal understanding today

## 2012-04-23 NOTE — Patient Instructions (Addendum)
Your physician has recommended you make the following change in your medication: START PRILOSEC DAILY FOR 2 WEEKS  Your physician has requested that you have an echocardiogram. DX:  786.50 Echocardiography is a painless test that uses sound waves to create images of your heart. It provides your doctor with information about the size and shape of your heart and how well your heart's chambers and valves are working. This procedure takes approximately one hour. There are no restrictions for this procedure.   Your physician has requested that you have en exercise stress myoview. DX: 786.50  For further information please visit [email or web address] Please follow instruction sheet, as given.  A chest x-ray takes a picture of the organs and structures inside the chest, including the heart, lungs, and blood vessels. This test can show several things, including, whether the heart is enlarges; whether fluid is building up in the lungs; and whether pacemaker / defibrillator leads are still in place. DX: 786.50 THIS TEST WILL BE DONE AT [facility].  Your physician recommends that you schedule a follow-up appointment WITH DR. ROSS IN 4-6 WEEKS

## 2012-04-23 NOTE — Telephone Encounter (Signed)
Message copied by Tarri Fuller on Thu Apr 23, 2012  2:57 PM ------      Message from: [place], [place] T      Created: Thu Apr 23, 2012  2:15 PM       Normal chest X-ray       [place], [place]  2:14 PM 04/23/2012

## 2012-05-01 ENCOUNTER — Telehealth: Payer: Self-pay | Admitting: *Deleted

## 2012-05-01 ENCOUNTER — Ambulatory Visit (HOSPITAL_COMMUNITY): Payer: Medicare Other | Attending: Cardiology | Admitting: Radiology

## 2012-05-01 ENCOUNTER — Encounter: Payer: Self-pay | Admitting: Physician Assistant

## 2012-05-01 DIAGNOSIS — I428 Other cardiomyopathies: Secondary | ICD-10-CM | POA: Insufficient documentation

## 2012-05-01 DIAGNOSIS — R079 Chest pain, unspecified: Secondary | ICD-10-CM

## 2012-05-01 DIAGNOSIS — I10 Essential (primary) hypertension: Secondary | ICD-10-CM | POA: Insufficient documentation

## 2012-05-01 DIAGNOSIS — I059 Rheumatic mitral valve disease, unspecified: Secondary | ICD-10-CM

## 2012-05-01 DIAGNOSIS — I2789 Other specified pulmonary heart diseases: Secondary | ICD-10-CM | POA: Insufficient documentation

## 2012-05-01 DIAGNOSIS — R0602 Shortness of breath: Secondary | ICD-10-CM

## 2012-05-01 DIAGNOSIS — I429 Cardiomyopathy, unspecified: Secondary | ICD-10-CM

## 2012-05-01 DIAGNOSIS — I369 Nonrheumatic tricuspid valve disorder, unspecified: Secondary | ICD-10-CM | POA: Insufficient documentation

## 2012-05-01 NOTE — Progress Notes (Signed)
Echocardiogram performed.  

## 2012-05-01 NOTE — Telephone Encounter (Signed)
pt notified about echo results w/verbal understanding 

## 2012-05-01 NOTE — Telephone Encounter (Signed)
Message copied by Tarri Fuller on Fri May 01, 2012  5:36 PM ------      Message from: [place], [place] T      Created: Fri May 01, 2012  5:32 PM       Normal LVF      Mild MVP      No change from prior      [place], [place]  5:32 PM 05/01/2012

## 2012-05-07 ENCOUNTER — Ambulatory Visit (HOSPITAL_COMMUNITY): Payer: Medicare Other | Attending: Cardiology | Admitting: Radiology

## 2012-05-07 VITALS — BP 109/82 | Ht 62.0 in | Wt 136.0 lb

## 2012-05-07 DIAGNOSIS — R002 Palpitations: Secondary | ICD-10-CM | POA: Insufficient documentation

## 2012-05-07 DIAGNOSIS — R42 Dizziness and giddiness: Secondary | ICD-10-CM | POA: Insufficient documentation

## 2012-05-07 DIAGNOSIS — I429 Cardiomyopathy, unspecified: Secondary | ICD-10-CM

## 2012-05-07 DIAGNOSIS — R079 Chest pain, unspecified: Secondary | ICD-10-CM

## 2012-05-07 DIAGNOSIS — I10 Essential (primary) hypertension: Secondary | ICD-10-CM | POA: Insufficient documentation

## 2012-05-07 MED ORDER — TECHNETIUM TC 99M SESTAMIBI GENERIC - CARDIOLITE
33.0000 | Freq: Once | INTRAVENOUS | Status: AC | PRN
  Administered 2012-05-07: 33 via INTRAVENOUS

## 2012-05-07 MED ORDER — TECHNETIUM TC 99M SESTAMIBI GENERIC - CARDIOLITE
11.0000 | Freq: Once | INTRAVENOUS | Status: AC | PRN
  Administered 2012-05-07: 11 via INTRAVENOUS

## 2012-05-07 NOTE — Progress Notes (Signed)
[facility] SITE 3 NUCLEAR MED [address] [place] [address] [phone]  Cardiology Nuclear Med Study  Kelly [name] is a 71 y.o. female     MRN : [id number]     DOB: [birth year]  Procedure Date: 05/07/2012  Nuclear Med Background Indication for Stress Test:  Evaluation for Ischemia History:  2002 Heart Cath: Nl coronaries: EF: 55%, 10/2008 (-) ischemia EF: 69% NL, 05/01/12 ECHO: EF: 50-55%, NICM Pulmonary HTN  Cardiac Risk Factors: Hypertension and Lipids  Symptoms:  Chest Pain, Dizziness and Palpitations   Nuclear Pre-Procedure Caffeine/Decaff Intake:  None > 12 hrs NPO After: 7:30pm   Lungs:  clear O2 Sat: 93-97% on room air. IV 0.9% NS with Angio Cath:  24g  IV Site: R Wrist x 1, tolerated well IV Started by:  Irean Hong, RN  Chest Size (in):  36 Cup Size: C  Height: 5\' 2"  (1.575 m)  Weight:  136 lb (61.689 kg)  BMI:  Body mass index is 24.87 kg/(m^2). Tech Comments:  n/a    Nuclear Med Study 1 or 2 day study: 1 day  Stress Test Type:  Stress  Reading MD: Willa Rough, MD  Order Authorizing Provider:  Dietrich Pates, and Tereso Newcomer, [facility]  Resting Radionuclide: Technetium 39m Sestamibi  Resting Radionuclide Dose: 11.0 mCi   Stress Radionuclide:  Technetium 46m Sestamibi  Stress Radionuclide Dose: 33.0 mCi           Stress Protocol Rest HR: 64 Stress HR: 126  Rest BP: 109/82 Stress BP: 128/66  Exercise Time (min): 7:00 METS: 7.0   Predicted Max HR: 147 bpm % Max HR: 85.71 bpm Rate Pressure Product: 86578   Dose of Adenosine (mg):  n/a Dose of Lexiscan: n/a mg  Dose of Atropine (mg): n/a Dose of Dobutamine: n/a mcg/kg/min (at max HR)  Stress Test Technologist: Milana Na, EMT-P  Nuclear Technologist:  Domenic Polite, CNMT     Rest Procedure:  Myocardial perfusion imaging was performed at rest 45 minutes following the intravenous administration of Technetium 71m Sestamibi. Rest ECG: NSR - Normal EKG  Stress Procedure:  The patient  performed treadmill exercise using a Bruce  Protocol for 7:00 minutes. The patient stopped due to fatigue and denied any chest pain.  There were non specific ST-T wave changes and occ pvcs/pacs.  Technetium 6m Tetrofosmin was injected at peak exercise and myocardial perfusion imaging was performed after a brief delay. Stress ECG: No significant ST segment change suggestive of ischemia.  QPS Raw Data Images:  Patient motion noted; appropriate software correction applied. Stress Images:  Normal homogeneous uptake in all areas of the myocardium. Rest Images:  Normal homogeneous uptake in all areas of the myocardium. Subtraction (SDS):  No evidence of ischemia. Transient Ischemic Dilatation (Normal <1.22):  1.10 Lung/Heart Ratio (Normal <0.45):  0.31  Quantitative Gated Spect Images QGS EDV:  65 ml QGS ESV:  23 ml  Impression Exercise Capacity:  Fair exercise capacity. BP Response:  Normal blood pressure response. Clinical Symptoms:  fatigue ECG Impression:  No significant ST segment change suggestive of ischemia. Comparison with Prior Nuclear Study: No images to compare  Overall Impression:  Normal stress nuclear study.  LV Ejection Fraction: 64%.  LV Wall Motion:  Normal Wall Motion  Willa Rough, MD

## 2012-05-08 ENCOUNTER — Telehealth: Payer: Self-pay | Admitting: *Deleted

## 2012-05-08 ENCOUNTER — Encounter: Payer: Self-pay | Admitting: Physician Assistant

## 2012-05-08 NOTE — Telephone Encounter (Signed)
Message copied by Tarri Fuller on Fri May 08, 2012  4:36 PM ------      Message from: [place], [place] T      Created: Fri May 08, 2012  1:31 PM       Please inform patient stress test normal.      Tereso Newcomer, PA-C  1:30 PM 05/08/2012

## 2012-05-08 NOTE — Telephone Encounter (Signed)
pt notified about stress test results w/verbal understanding today 

## 2012-06-01 ENCOUNTER — Encounter: Payer: Self-pay | Admitting: Physician Assistant

## 2012-06-01 ENCOUNTER — Ambulatory Visit (INDEPENDENT_AMBULATORY_CARE_PROVIDER_SITE_OTHER): Payer: Medicare Other | Admitting: Physician Assistant

## 2012-06-01 VITALS — BP 133/70 | HR 73 | Resp 18 | Ht 62.0 in | Wt 141.8 lb

## 2012-06-01 DIAGNOSIS — R079 Chest pain, unspecified: Secondary | ICD-10-CM

## 2012-06-01 DIAGNOSIS — I428 Other cardiomyopathies: Secondary | ICD-10-CM

## 2012-06-01 DIAGNOSIS — I341 Nonrheumatic mitral (valve) prolapse: Secondary | ICD-10-CM

## 2012-06-01 DIAGNOSIS — E785 Hyperlipidemia, unspecified: Secondary | ICD-10-CM

## 2012-06-01 DIAGNOSIS — I059 Rheumatic mitral valve disease, unspecified: Secondary | ICD-10-CM

## 2012-06-01 NOTE — Patient Instructions (Addendum)
NO CHANGES WERE MADE TODAY  Your physician wants you to follow-up in: 05/2013 WITH DR. Tenny Craw. You will receive a reminder letter in the mail two months in advance. If you don't receive a letter, please call our office to schedule the follow-up appointment.

## 2012-06-01 NOTE — Progress Notes (Signed)
[address] [place], [place]  [address] Phone: [phone] Fax:  [phone]  Date:  06/01/2012   Name:  Kelly [name]   DOB:  12-27-1940   MRN:  [id number]  PCP:  Horald Pollen., PA  Primary Cardiologist:  Dr. Dietrich Pates  Primary Electrophysiologist:  None    History of Present Illness: Kelly [name] is a 71 y.o. female who returns for follow up.  She has a hx of NICM with mild LV dysfunction, HTN and HL. She has a history of normal coronary arteries by catheterization in 2002. Last echo in 2010 demonstrated an EF of 50-55% and Myoview normal in 2010. Last seen by Dr. Tenny Craw 08/2011.  I saw her 04/23/12 with complaints of chest pain. She had been off of her PPI. I recommended that she restart this. Also recommended a followup echocardiogram as well as an ETT-Myoview. Echocardiogram demonstrated normal LV function with mild mitral valve prolapse. Myoview demonstrated normal perfusion. Of note, chest x-ray was also normal.  Since being seen, she is doing well.  Having less chest pain with taking PPI regularly.  No exertional symptoms.  No dyspnea, orthopnea, PND, syncope.  She has mild LE edema without changes.   Labs (1/13):  K 3.5, creatinine 0.8  Wt Readings from Last 3 Encounters:  06/01/12 141 lb 12.8 oz (64.32 kg)  05/07/12 136 lb (61.689 kg)  04/23/12 138 lb 9.6 oz (62.869 kg)     Past Medical History  Diagnosis Date  . HLD (hyperlipidemia)   . Migraine   . HTN (hypertension)   . NICM (nonischemic cardiomyopathy)     a. LHC 8/02: EF 55%, 2+ MR, normal cors;  b.  Echo 3/10: EF 50-55%, mild diast dysfn, mild MVP (ant leaflet), mild MR, mild TR, small pericard eff.;   c.  Myoview 3/10:  EF 69%, no ischemia;  d. Echo 9/13: EF 50-55%, Gr 1 diast dysfn, mild prolapse of ant MV leaflet;   e. Myoview 9/13: EF 64%, NL perfusion   . Pulmonary HTN   . Depression with anxiety     Current Outpatient Prescriptions  Medication Sig Dispense Refill  .  aspirin 81 MG tablet Take 81 mg by mouth daily.      . cholecalciferol (VITAMIN D) 1000 UNITS tablet Take 1,000 Units by mouth daily.      . furosemide (LASIX) 20 MG tablet TAKE ONE TABLET BY MOUTH EVERY DAY  90 tablet  3  . Multiple Vitamin (MULTIVITAMIN) tablet Take 1 tablet by mouth daily.      Marland Kitchen omeprazole (PRILOSEC) 40 MG capsule TAKE ONE CAPSULE BY MOUTH EVERY DAY  30 capsule  3  . valsartan (DIOVAN) 80 MG tablet Take 1 tablet (80 mg total) by mouth daily.  90 tablet  3    Allergies: Allergies  Allergen Reactions  . Codeine Nausea And Vomiting  . Simvastatin     History  Substance Use Topics  . Smoking status: Never Smoker   . Smokeless tobacco: Never Used  . Alcohol Use: No     PHYSICAL EXAM: VS:  BP 133/70  Pulse 73  Resp 18  Ht 5\' 2"  (1.575 m)  Wt 141 lb 12.8 oz (64.32 kg)  BMI 25.94 kg/m2  SpO2 93% Well nourished, well developed, in no acute distress HEENT: normal Neck: no JVD Cardiac:  normal S1, S2; RRR; no murmur; no clicks Lungs:  clear to auscultation bilaterally, no wheezing, rhonchi or rales Abd: soft, nontender, no  hepatomegaly Ext: trace bilateral ankle edema Skin: warm and dry Neuro:  CNs 2-12 intact, no focal abnormalities noted  EKG:  Sinus rhythm, heart rate 68, low-voltage, nonspecific ST-T wave changes, no change from prior tracing    ASSESSMENT AND PLAN:  1. Nonischemic Cardiomyopathy:  Mild LV dysfunction in the past.  Recent echo with normal LV function.  Continue current Rx.  2. Chest Pain:  Atypical.  Myoview low risk.  No further cardiac workup.  Continue follow up with PCP.  If chest pain continues with daily PPI, may need to be seen by GI.  3. Mitral Valve Prolapse:  May or may not be cause of atypical chest pain.  Advised her to maintain regular exercise regimen and avoid caffeine.  Follow up with Dr. Dietrich Pates in one year.    4. Hyperlipidemia:  Managed by PCP.   Signed, Tereso Newcomer, PA-C  1:39 PM 06/01/2012

## 2012-06-25 ENCOUNTER — Other Ambulatory Visit: Payer: Self-pay | Admitting: Internal Medicine

## 2012-07-10 ENCOUNTER — Ambulatory Visit: Payer: Medicare Other | Admitting: Internal Medicine

## 2012-09-14 ENCOUNTER — Ambulatory Visit: Payer: Medicare Other | Admitting: Internal Medicine

## 2012-10-19 ENCOUNTER — Ambulatory Visit: Payer: Medicare Other | Admitting: Internal Medicine

## 2012-10-30 ENCOUNTER — Other Ambulatory Visit: Payer: Self-pay | Admitting: *Deleted

## 2012-11-25 ENCOUNTER — Other Ambulatory Visit: Payer: Self-pay

## 2012-11-25 ENCOUNTER — Ambulatory Visit: Payer: Self-pay

## 2012-12-07 ENCOUNTER — Other Ambulatory Visit: Payer: Self-pay | Admitting: *Deleted

## 2012-12-07 ENCOUNTER — Encounter: Payer: Self-pay | Admitting: *Deleted

## 2012-12-07 MED ORDER — MECLIZINE HCL 12.5 MG PO TABS: ORAL_TABLET | ORAL | Status: DC

## 2012-12-07 NOTE — Telephone Encounter (Signed)
LAST OV 12/13 FOR WELLNESS VISIT

## 2012-12-17 ENCOUNTER — Encounter (INDEPENDENT_AMBULATORY_CARE_PROVIDER_SITE_OTHER): Payer: Medicare Other | Admitting: Ophthalmology

## 2012-12-17 DIAGNOSIS — H3530 Unspecified macular degeneration: Secondary | ICD-10-CM

## 2012-12-17 DIAGNOSIS — H43819 Vitreous degeneration, unspecified eye: Secondary | ICD-10-CM

## 2012-12-22 ENCOUNTER — Other Ambulatory Visit: Payer: Self-pay | Admitting: Internal Medicine

## 2013-01-17 ENCOUNTER — Other Ambulatory Visit: Payer: Self-pay | Admitting: Internal Medicine

## 2013-03-17 ENCOUNTER — Ambulatory Visit (INDEPENDENT_AMBULATORY_CARE_PROVIDER_SITE_OTHER): Payer: Medicare Other

## 2013-03-17 ENCOUNTER — Ambulatory Visit (INDEPENDENT_AMBULATORY_CARE_PROVIDER_SITE_OTHER): Payer: Medicare Other | Admitting: Pharmacist

## 2013-03-17 ENCOUNTER — Other Ambulatory Visit: Payer: Self-pay | Admitting: Internal Medicine

## 2013-03-17 ENCOUNTER — Ambulatory Visit: Payer: Medicare Other

## 2013-03-17 VITALS — BP 112/60 | HR 74 | Ht 61.0 in | Wt 140.0 lb

## 2013-03-17 DIAGNOSIS — Z780 Asymptomatic menopausal state: Secondary | ICD-10-CM

## 2013-03-17 DIAGNOSIS — M949 Disorder of cartilage, unspecified: Secondary | ICD-10-CM

## 2013-03-17 DIAGNOSIS — M8580 Other specified disorders of bone density and structure, unspecified site: Secondary | ICD-10-CM

## 2013-03-17 NOTE — Progress Notes (Signed)
Patient ID: Kelly [name], female   DOB: 01-01-1941, 72 y.o.   MRN: [id number] [facility] Current Height: Height: 5\' 1"  (154.9 cm)      Max Lifetime Height:  5"2" Current Weight: Weight: 140 lb (63.504 kg)       Ethnicity:Caucasian  BP: BP: 112/60 mmHg     HR:  Pulse Rate: 74      HPI: Does pt already have a diagnosis of:  Osteopenia?  No Osteoporosis?  No  Back Pain?  No       Kyphosis?  No Prior fracture?  No Med(s) for Osteoporosis/Osteopenia:  none Med(s) previously tried for Osteoporosis/Osteopenia:  none                                                             PMH: Age at menopause:  1973 Hysterectomy?  Yes Oophorectomy?  Yes HRT? Yes - Former.  Type/duration: premarin for 25 years Steroid Use?  No Thyroid med?  No History of cancer?  No History of digestive disorders (ie Crohn's)?  Yes - GERD, takes omeprazole 40mg  daily Current or previous eating disorders?  No Last Vitamin D Result:  80 (07/2012) Last GFR Result:  79 (07/2012)   FH/SH: Family history of osteoporosis?  Yes - older sister Parent with history of hip fracture?  No Family history of breast cancer?  No Exercise?  Yes - active lifestyle and walking daily Smoking?  No Alcohol?  No    Calcium Assessment Calcium Intake  # of servings/day  Calcium mg  Milk (8 oz) 1  x  300  = 300mg   Yogurt (4 oz) 0 x  200 = 0  Cheese (1 oz) 1 x  200 = 200mg   Other Calcium sources   250mg   Ca supplement MVI = 400mg    Estimated calcium intake per day 1150mg      DEXA Results Date of Test T-Score for AP Spine L1-L4 T-Score for Total Left Hip T-Score for Total Right Hip  03/17/2013 -1.3 -0.3 -0.2  08/08/2010 -1.0 -0.2 -0.2  05/30/2003 -1.0 -0.2 -0.2  11/18/2000 -0.6 0.3 --   FRAX 10 year estimate: Total FX risk:  10%  (consider medication if >/= 20%) Hip FX risk:  1.7%  (consider medication if >/= 3%)  Assessment: Osteopenia with low estimated risk of fracture currently  Recommendations: 1.   Reviewed DEXA results and discussed risk of fracture 2.  recommend calcium 1200mg  daily through supplementation or diet.  3.  continue weight bearing exercise - 30 minutes at least 4 days  per week.   4.  Counseled and educated about fall risk and prevention.  Recheck DEXA:  2 years  Time spent counseling patient:  25 minutes

## 2013-03-17 NOTE — Patient Instructions (Addendum)

## 2013-04-26 ENCOUNTER — Telehealth: Payer: Self-pay | Admitting: Internal Medicine

## 2013-04-26 NOTE — Telephone Encounter (Signed)
Pt states has been taking Lasix 20 mg daily and is not helping the swelling  Pt would like to know if she can increased the Lasix does to 40 mg daily  to see if this helps the swelling in her knees.

## 2013-04-26 NOTE — Telephone Encounter (Signed)
New Problem  Pt request if she can have her lasix increased for her fluid in her feet and legs. She believes that the 20 Mg is not enough.

## 2013-04-27 NOTE — Telephone Encounter (Signed)
Patient has not been seen in clinic for over 1 year.  Has not had labs in over 1 year.  She needs to get clinic appt with me or PA for exam and labs before any change can be recommended.

## 2013-04-27 NOTE — Telephone Encounter (Signed)
An appointment was made for pt to be seen with scott weaver PA; on 05/13/13 at 12:10 AM pt aware.

## 2013-05-03 ENCOUNTER — Ambulatory Visit: Payer: Medicare Other | Admitting: Physician Assistant

## 2013-05-13 ENCOUNTER — Ambulatory Visit (INDEPENDENT_AMBULATORY_CARE_PROVIDER_SITE_OTHER): Payer: Medicare Other | Admitting: Physician Assistant

## 2013-05-13 ENCOUNTER — Encounter: Payer: Self-pay | Admitting: Physician Assistant

## 2013-05-13 VITALS — BP 120/70 | HR 61 | Ht 61.0 in | Wt 143.0 lb

## 2013-05-13 DIAGNOSIS — E785 Hyperlipidemia, unspecified: Secondary | ICD-10-CM

## 2013-05-13 DIAGNOSIS — I10 Essential (primary) hypertension: Secondary | ICD-10-CM

## 2013-05-13 DIAGNOSIS — I428 Other cardiomyopathies: Secondary | ICD-10-CM

## 2013-05-13 DIAGNOSIS — R609 Edema, unspecified: Secondary | ICD-10-CM

## 2013-05-13 LAB — BASIC METABOLIC PANEL
BUN: 15 mg/dL (ref 6–23)
CO2: 29 mEq/L (ref 19–32)
Calcium: 9.4 mg/dL (ref 8.4–10.5)
Chloride: 103 mEq/L (ref 96–112)
GFR: 62.54 mL/min (ref >60.00)
Glucose, Bld: 76 mg/dL (ref 70–99)
Potassium: 3.4 mEq/L — ABNORMAL LOW (ref 3.5–5.1)
Sodium: 140 mEq/L (ref 135–145)

## 2013-05-13 MED ORDER — FUROSEMIDE 20 MG PO TABS: 20.0000 mg | ORAL_TABLET | ORAL | Status: DC

## 2013-05-13 NOTE — Progress Notes (Signed)
[address] [place], [place]  [address] Phone: [phone] Fax:  [phone]  Date:  05/13/2013   ID:  Kelly [name]; DOB:  June 16, 1941; MRN:  [id number]  PCP:  Horald Pollen., PA-C  Primary Cardiologist:  Dr. Dietrich Pates    History of Present Illness: Kelly [name] is a 72 y.o. female who returns for the evaluation of edema.    She has a hx of NICM with mild LV dysfunction, HTN and HL. She has a history of normal coronary arteries by catheterization in 2002.  Echocardiogram in 2013 demonstrated normal LV function with mild mitral valve prolapse. Myoview in 2013 demonstrated normal perfusion.  Last seen here by me in 05/2012.  She presents today for evaluation of edema.  She notes worsening edema on some days and she takes extra Lasix with some relief.  She denies CP, dyspnea, orthopnea, PND.  She does have worsening edema with prolonged standing.  She has difficulty with varicose veins.    Labs (1/13):  K 3.5, creatinine 0.8  Wt Readings from Last 3 Encounters:  05/13/13 143 lb (64.864 kg)  03/17/13 140 lb (63.504 kg)  06/01/12 141 lb 12.8 oz (64.32 kg)     Past Medical History  Diagnosis Date  . HLD (hyperlipidemia)   . Migraine   . HTN (hypertension)   . NICM (nonischemic cardiomyopathy)     a. LHC 8/02: EF 55%, 2+ MR, normal cors;  b.  Echo 3/10: EF 50-55%, mild diast dysfn, mild MVP (ant leaflet), mild MR, mild TR, small pericard eff.;   c.  Myoview 3/10:  EF 69%, no ischemia;  d. Echo 9/13: EF 50-55%, Gr 1 diast dysfn, mild prolapse of ant MV leaflet;   e. Myoview 9/13: EF 64%, NL perfusion   . Pulmonary HTN   . Depression with anxiety     Current Outpatient Prescriptions  Medication Sig Dispense Refill  . aspirin 81 MG tablet Take 81 mg by mouth daily.      . cholecalciferol (VITAMIN D) 1000 UNITS tablet Take 1,000 Units by mouth daily.      Marland Kitchen DIOVAN 80 MG tablet TAKE ONE TABLET BY MOUTH EVERY DAY  90 tablet  3  . furosemide (LASIX) 20 MG  tablet TAKE ONE TABLET BY MOUTH EVERY DAY  90 tablet  3  . meclizine (ANTIVERT) 12.5 MG tablet TAKE ONE TABLET BY MOUTH EVERY 12-24 HOURS AS NEEDED  40 tablet  1  . Multiple Vitamin (MULTIVITAMIN) tablet Take 1 tablet by mouth daily.      Marland Kitchen omeprazole (PRILOSEC) 40 MG capsule TAKE ONE CAPSULE BY MOUTH ONCE DAILY  30 capsule  3   No current facility-administered medications for this visit.    Allergies: Allergies  Allergen Reactions  . Codeine Nausea And Vomiting  . Simvastatin    Social History:  The patient  reports that she has never smoked. She has never used smokeless tobacco. She reports that she does not drink alcohol or use illicit drugs.    PHYSICAL EXAM: VS:  BP 120/70  Pulse 61  Ht 5\' 1"  (1.549 m)  Wt 143 lb (64.864 kg)  BMI 27.03 kg/m2 Well nourished, well developed, in no acute distress HEENT: normal Neck: no JVD Cardiac:  normal S1, S2; RRR; no murmur; no clicks Lungs:  clear to auscultation bilaterally, no wheezing, rhonchi or rales Abd: soft, nontender, no hepatomegaly Ext: no edema; multiple varicosities noted Skin: warm and dry Neuro:  CNs 2-12 intact,  no focal abnormalities noted  EKG:  NSR, HR 61, low-voltage, nonspecific ST-T wave changes, no change from prior tracing    ASSESSMENT AND PLAN:  1. Edema:  I suspect she has more dependent edema than anything.  She does have some diastolic dysfunction.  I do not think she is volume overloaded.  She can take extra Lasix on days she feels more swollen.  I will check a BMET today.  I have advised her to wear compression stockings on days when she stands for prolonged periods of time. 2. NICM:  EF has recovered by follow up echocardiograms.  Continue current Rx.   3. Hypertension:  Controlled.  Continue current therapy. Check BMET today. 4. Hyperlipidemia:  Managed by PCP.  5. Disposition:  F/u with Dr. Dietrich Pates in 3 mos.   Signed, Tereso Newcomer, PA-C  05/13/2013 1:41 PM

## 2013-05-13 NOTE — Patient Instructions (Addendum)
Take Lasix 20 mg daily.  Wear compression stockings if you will be up on your feet for prolonged periods of time for the day.  Take an extra Lasix 20 mg on days when your swelling is worse.  Schedule a follow up with Dr. Dietrich Pates in 3 months.

## 2013-05-14 ENCOUNTER — Telehealth: Payer: Self-pay | Admitting: *Deleted

## 2013-05-14 DIAGNOSIS — I10 Essential (primary) hypertension: Secondary | ICD-10-CM

## 2013-05-14 MED ORDER — POTASSIUM CHLORIDE CRYS ER 20 MEQ PO TBCR: 20.0000 meq | EXTENDED_RELEASE_TABLET | Freq: Every day | ORAL | Status: DC

## 2013-05-14 NOTE — Telephone Encounter (Signed)
pt notified about her lab results and to start K+ 20 meq daily, rx sent in to [facility] in [place], bmet 05/21/13. Pt verbalized understanding to Plan of Care

## 2013-05-20 ENCOUNTER — Other Ambulatory Visit: Payer: Medicare Other

## 2013-05-20 DIAGNOSIS — I10 Essential (primary) hypertension: Secondary | ICD-10-CM

## 2013-05-20 LAB — BASIC METABOLIC PANEL
CO2: 32 mEq/L (ref 19–32)
Chloride: 103 mEq/L (ref 96–112)
Creatinine, Ser: 1 mg/dL (ref 0.4–1.2)
Glucose, Bld: 84 mg/dL (ref 70–99)

## 2013-05-21 ENCOUNTER — Telehealth: Payer: Self-pay | Admitting: *Deleted

## 2013-05-21 ENCOUNTER — Other Ambulatory Visit: Payer: Medicare Other

## 2013-05-21 DIAGNOSIS — I10 Essential (primary) hypertension: Secondary | ICD-10-CM

## 2013-05-21 MED ORDER — POTASSIUM CHLORIDE CRYS ER 20 MEQ PO TBCR: 10.0000 meq | EXTENDED_RELEASE_TABLET | Freq: Every day | ORAL | Status: DC

## 2013-05-21 NOTE — Telephone Encounter (Signed)
pt notified about lab results and to decrease K+ to 10 meq daily, pt verbalized understanding to me today

## 2013-06-02 ENCOUNTER — Ambulatory Visit (INDEPENDENT_AMBULATORY_CARE_PROVIDER_SITE_OTHER): Payer: Medicare Other

## 2013-06-02 DIAGNOSIS — Z23 Encounter for immunization: Secondary | ICD-10-CM

## 2013-06-24 ENCOUNTER — Other Ambulatory Visit: Payer: Self-pay | Admitting: Internal Medicine

## 2013-07-13 ENCOUNTER — Ambulatory Visit: Payer: Medicare Other

## 2013-07-19 ENCOUNTER — Ambulatory Visit (INDEPENDENT_AMBULATORY_CARE_PROVIDER_SITE_OTHER): Payer: Medicare Other | Admitting: *Deleted

## 2013-07-19 DIAGNOSIS — Z23 Encounter for immunization: Secondary | ICD-10-CM

## 2013-07-19 MED ORDER — PNEUMOCOCCAL 13-VAL CONJ VACC IM SUSP
0.5000 mL | INTRAMUSCULAR | Status: AC
  Administered 2013-07-19: 0.5 mL via INTRAMUSCULAR

## 2013-07-22 ENCOUNTER — Ambulatory Visit (INDEPENDENT_AMBULATORY_CARE_PROVIDER_SITE_OTHER): Payer: Medicare Other | Admitting: Internal Medicine

## 2013-07-22 VITALS — BP 125/72 | HR 88 | Ht 62.0 in | Wt 146.0 lb

## 2013-07-22 DIAGNOSIS — R0602 Shortness of breath: Secondary | ICD-10-CM

## 2013-07-22 DIAGNOSIS — R609 Edema, unspecified: Secondary | ICD-10-CM

## 2013-07-22 LAB — BASIC METABOLIC PANEL
BUN: 17 mg/dL (ref 6–23)
CO2: 30 mEq/L (ref 19–32)
Calcium: 9.1 mg/dL (ref 8.4–10.5)
Glucose, Bld: 79 mg/dL (ref 70–99)
Potassium: 3.7 mEq/L (ref 3.5–5.1)
Sodium: 139 mEq/L (ref 135–145)

## 2013-07-22 LAB — BRAIN NATRIURETIC PEPTIDE: Pro B Natriuretic peptide (BNP): 93 pg/mL (ref 0.0–100.0)

## 2013-07-22 NOTE — Patient Instructions (Signed)
Your physician recommends that you continue on your current medications as directed. Please refer to the Current Medication list given to you today.  Your physician recommends that you return for lab work in: today  Your physician wants you to follow-up in: 6 months. You will receive a reminder letter in the mail two months in advance. If you don't receive a letter, please call our office to schedule the follow-up appointment.   

## 2013-07-22 NOTE — Progress Notes (Signed)
History of Present Illness: Kelly [name] is a 72 y.o. female who returns for the evaluation of edema.    She has a hx of NICM with mild LV dysfunction, HTN and HL. She has a history of normal coronary arteries by catheterization in 2002.  Echocardiogram in 2013 demonstrated normal LV function with mild mitral valve prolapse. Myoview in 2013 demonstrated normal perfusion.  SHe was last seen by Wende Mott earlier this fall  She had complained of LE edema  He recommended prn Lasix 40 with KCL   She says that she is taking 2 to 3 times per wk  Denies signif edema.  Does have occasional SOB     Wt Readings from Last 3 Encounters:  07/22/13 146 lb (66.225 kg)  05/13/13 143 lb (64.864 kg)  03/17/13 140 lb (63.504 kg)     Past Medical History  Diagnosis Date  . HLD (hyperlipidemia)   . Migraine   . HTN (hypertension)   . NICM (nonischemic cardiomyopathy)     a. LHC 8/02: EF 55%, 2+ MR, normal cors;  b.  Echo 3/10: EF 50-55%, mild diast dysfn, mild MVP (ant leaflet), mild MR, mild TR, small pericard eff.;   c.  Myoview 3/10:  EF 69%, no ischemia;  d. Echo 9/13: EF 50-55%, Gr 1 diast dysfn, mild prolapse of ant MV leaflet;   e. Myoview 9/13: EF 64%, NL perfusion   . Pulmonary HTN   . Depression with anxiety     Current Outpatient Prescriptions  Medication Sig Dispense Refill  . aspirin 81 MG tablet Take 81 mg by mouth daily.      . cholecalciferol (VITAMIN D) 1000 UNITS tablet Take 1,000 Units by mouth daily.      Marland Kitchen DIOVAN 80 MG tablet TAKE ONE TABLET BY MOUTH EVERY DAY  90 tablet  0  . furosemide (LASIX) 20 MG tablet Take 20 mg by mouth daily. May take extra if needed      . meclizine (ANTIVERT) 12.5 MG tablet TAKE ONE TABLET BY MOUTH EVERY 12-24 HOURS AS NEEDED  40 tablet  1  . Multiple Vitamin (MULTIVITAMIN) tablet Take 1 tablet by mouth daily.      Marland Kitchen omeprazole (PRILOSEC) 40 MG capsule TAKE ONE CAPSULE BY MOUTH ONCE DAILY  30 capsule  3  . potassium chloride SA (K-DUR,KLOR-CON) 20  MEQ tablet Take 0.5 tablets (10 mEq total) by mouth daily.       No current facility-administered medications for this visit.    Allergies: Allergies  Allergen Reactions  . Codeine Nausea And Vomiting  . Simvastatin    Social History:  The patient  reports that she has never smoked. She has never used smokeless tobacco. She reports that she does not drink alcohol or use illicit drugs.    PHYSICAL EXAM: VS:  BP 125/72  Pulse 88  Ht 5\' 2"  (1.575 m)  Wt 146 lb (66.225 kg)  BMI 26.70 kg/m2 Well nourished, well developed, in no acute distress HEENT: normal Neck: JVP is 9 Cardiac:  normal S1, S2; RRR; no murmur; no clicks Lungs:  clear to auscultation bilaterally, no wheezing, rhonchi or rales Abd: soft, nontender, no hepatomegaly Ext: no edema; multiple varicosities noted Skin: warm and dry Neuro:  CNs 2-12 intact, no focal abnormalities noted    ASSESSMENT AND PLAN:  1. Edema: Improved  She is taking prn Lasix/K  Using support socks  Elevating   Leg  I would keep on same regimen   2.  NICM:  EF has recovered by follow up echocardiograms. VOlume status looks good   3. Hypertension:  Controlled.  Continue current therapy. Check BMET today. 4. Hyperlipidemia:  Managed by PCP.

## 2013-07-30 ENCOUNTER — Telehealth: Payer: Self-pay | Admitting: Cardiology

## 2013-07-30 NOTE — Telephone Encounter (Signed)
New Message ° °Pt called for results. Please call  °

## 2013-07-30 NOTE — Telephone Encounter (Signed)
Spoke with pt, aware of lab results. 

## 2013-09-16 ENCOUNTER — Other Ambulatory Visit: Payer: Self-pay | Admitting: Internal Medicine

## 2013-10-22 ENCOUNTER — Ambulatory Visit (INDEPENDENT_AMBULATORY_CARE_PROVIDER_SITE_OTHER): Payer: Medicare Other | Admitting: General Practice

## 2013-10-22 ENCOUNTER — Encounter: Payer: Self-pay | Admitting: General Practice

## 2013-10-22 VITALS — BP 132/67 | HR 70 | Temp 97.3°F | Ht 62.0 in | Wt 145.0 lb

## 2013-10-22 DIAGNOSIS — H6690 Otitis media, unspecified, unspecified ear: Secondary | ICD-10-CM

## 2013-10-22 DIAGNOSIS — K1370 Unspecified lesions of oral mucosa: Secondary | ICD-10-CM

## 2013-10-22 DIAGNOSIS — H6691 Otitis media, unspecified, right ear: Secondary | ICD-10-CM

## 2013-10-22 MED ORDER — FIRST-DUKES MOUTHWASH MT SUSP: 5.0000 mL | Freq: Three times a day (TID) | OROMUCOSAL | Status: AC

## 2013-10-22 MED ORDER — CIPROFLOXACIN-DEXAMETHASONE 0.3-0.1 % OT SUSP: 4.0000 [drp] | Freq: Two times a day (BID) | OTIC | Status: AC

## 2013-10-22 NOTE — Patient Instructions (Signed)
Otitis Media, Adult Otitis media is redness, soreness, and swelling (inflammation) of the middle ear. Otitis media may be caused by allergies or, most commonly, by infection. Often it occurs as a complication of the common cold. SIGNS AND SYMPTOMS Symptoms of otitis media may include:  Earache.  Fever.  Ringing in your ear.  Headache.  Leakage of fluid from the ear. DIAGNOSIS To diagnose otitis media, your health care provider will examine your ear with an otoscope. This is an instrument that allows your health care provider to see into your ear in order to examine your eardrum. Your health care provider also will ask you questions about your symptoms. TREATMENT  Typically, otitis media resolves on its own within 3 5 days. Your health care provider may prescribe medicine to ease your symptoms of pain. If otitis media does not resolve within 5 days or is recurrent, your health care provider may prescribe antibiotic medicines if he or she suspects that a bacterial infection is the cause. HOME CARE INSTRUCTIONS   Take your medicine as directed until it is gone, even if you feel better after the first few days.  Only take over-the-counter or prescription medicines for pain, discomfort, or fever as directed by your health care provider.  Follow up with your health care provider as directed. SEEK MEDICAL CARE IF:  You have otitis media only in one ear or bleeding from your nose or both.  You notice a lump on your neck.  You are not getting better in 3 5 days.  You feel worse instead of better. SEEK IMMEDIATE MEDICAL CARE IF:   You have pain that is not controlled with medicine.  You have swelling, redness, or pain around your ear or stiffness in your neck.  You notice that part of your face is paralyzed.  You notice that the bone behind your ear (mastoid) is tender when you touch it. MAKE SURE YOU:   Understand these instructions.  Will watch your condition.  Will get help  right away if you are not doing well or get worse. Document Released: 05/10/2004 Document Revised: 05/26/2013 Document Reviewed: 03/02/2013 [employer] Patient Information 2014 [employer].  

## 2013-10-22 NOTE — Progress Notes (Signed)
   Subjective:    Patient ID: Kelly [name], female    DOB: [birth year], 73 y.o.   MRN: [id number]  Otalgia  There is pain in the right ear. This is a new problem. The current episode started in the past 7 days. The problem occurs every few hours. The problem has been unchanged. There has been no fever. Associated symptoms include coughing. Pertinent negatives include no diarrhea, headaches, rhinorrhea, sore throat or vomiting. She has tried nothing for the symptoms. There is no history of a chronic ear infection or hearing loss.  Cough This is a new problem. The current episode started yesterday. The problem has been unchanged. The cough is non-productive. Associated symptoms include ear pain and postnasal drip. Pertinent negatives include no chest pain, chills, fever, headaches, rhinorrhea, sore throat or shortness of breath. The symptoms are aggravated by lying down. She has tried nothing for the symptoms. Her past medical history is significant for pneumonia. There is no history of asthma or bronchitis.  complains of oral lesion, onset two days ago and gradually clearing.     Review of Systems  Constitutional: Negative for fever and chills.  HENT: Positive for ear pain and postnasal drip. Negative for congestion, rhinorrhea and sore throat.   Respiratory: Positive for cough. Negative for chest tightness and shortness of breath.   Cardiovascular: Negative for chest pain and palpitations.  Gastrointestinal: Negative for vomiting and diarrhea.  Neurological: Negative for headaches.       Objective:   Physical Exam  Constitutional: She is oriented to person, place, and time. She appears well-developed and well-nourished.  HENT:  Head: Normocephalic and atraumatic.  Right Ear: Tympanic membrane is erythematous.  Left Ear: External ear normal.  Mouth/Throat: Oral lesions present.  Oral lesion noted to left inner upper lip, negative for drainage.   Cardiovascular: Normal rate, regular  rhythm and normal heart sounds.   Pulmonary/Chest: Effort normal and breath sounds normal. No respiratory distress. She exhibits no tenderness.  Neurological: She is alert and oriented to person, place, and time.  Skin: Skin is warm and dry.  Psychiatric: She has a normal mood and affect.          Assessment & Plan:  1. Oral lesion  - Diphenhyd-Hydrocort-Nystatin (FIRST-DUKES MOUTHWASH) SUSP; Use as directed 5 mLs in the mouth or throat 3 (three) times daily after meals.  Dispense: 1 Bottle; Refill: 0  2. Right otitis media  - ciprofloxacin-dexamethasone (CIPRODEX) otic suspension; Place 4 drops into the right ear 2 (two) times daily.  Dispense: 7.5 mL; Refill: 1 -increase fluids -avoid inserting items in ear -RTO office if symptoms worsen or unresolved -Patient verbalized understanding Coralie Keens, FNP-C

## 2013-11-13 ENCOUNTER — Ambulatory Visit (INDEPENDENT_AMBULATORY_CARE_PROVIDER_SITE_OTHER): Payer: Medicare Other | Admitting: General Practice

## 2013-11-13 VITALS — BP 118/54 | HR 96 | Temp 97.0°F | Ht 62.0 in | Wt 147.2 lb

## 2013-11-13 DIAGNOSIS — R05 Cough: Secondary | ICD-10-CM

## 2013-11-13 DIAGNOSIS — R059 Cough, unspecified: Secondary | ICD-10-CM

## 2013-11-13 DIAGNOSIS — J069 Acute upper respiratory infection, unspecified: Secondary | ICD-10-CM

## 2013-11-13 MED ORDER — BENZONATATE 100 MG PO CAPS: 100.0000 mg | ORAL_CAPSULE | Freq: Three times a day (TID) | ORAL | Status: DC | PRN

## 2013-11-13 MED ORDER — AZITHROMYCIN 250 MG PO TABS: ORAL_TABLET | ORAL | Status: DC

## 2013-11-13 NOTE — Progress Notes (Signed)
   Subjective:    Patient ID: Kelly [name], female    DOB: [birth year], 73 y.o.   MRN: [id number]  Cough This is a new problem. The current episode started in the past 7 days. The problem has been gradually worsening. The cough is productive of sputum. Associated symptoms include nasal congestion and postnasal drip. Pertinent negatives include no chest pain, chills, fever, headaches, sore throat, shortness of breath or wheezing. The symptoms are aggravated by lying down. She has tried nothing for the symptoms. There is no history of asthma, bronchitis or pneumonia.      Review of Systems  Constitutional: Negative for fever and chills.  HENT: Positive for congestion and postnasal drip. Negative for sore throat.   Respiratory: Positive for cough. Negative for chest tightness, shortness of breath and wheezing.   Cardiovascular: Negative for chest pain and palpitations.  Neurological: Negative for dizziness, weakness and headaches.       Objective:   Physical Exam  Constitutional: She is oriented to person, place, and time. She appears well-developed and well-nourished.  HENT:  Head: Normocephalic and atraumatic.  Right Ear: External ear normal.  Left Ear: External ear normal.  Mouth/Throat: Posterior oropharyngeal erythema present.  Cardiovascular: Normal rate, regular rhythm and normal heart sounds.   Pulmonary/Chest: Effort normal and breath sounds normal. No respiratory distress. She exhibits no tenderness.  Neurological: She is alert and oriented to person, place, and time.  Skin: Skin is warm and dry.  Psychiatric: She has a normal mood and affect.           Assessment & Plan:  1. Cough  - benzonatate (TESSALON) 100 MG capsule; Take 1 capsule (100 mg total) by mouth 3 (three) times daily as needed.  Dispense: 30 capsule; Refill: 0  2. Upper respiratory infection  - azithromycin (ZITHROMAX) 250 MG tablet; Take as directed  Dispense: 6 tablet; Refill: 0 -discussed  adequate hydration RTO if symptoms worsen or unresolved Patient verbalized understanding Coralie Keens, FNP-C

## 2013-11-13 NOTE — Patient Instructions (Signed)

## 2013-12-19 ENCOUNTER — Other Ambulatory Visit: Payer: Self-pay | Admitting: Family Medicine

## 2013-12-19 ENCOUNTER — Other Ambulatory Visit: Payer: Self-pay | Admitting: Internal Medicine

## 2013-12-21 NOTE — Telephone Encounter (Signed)
Patient of Mae. Last seen in office on 11-13-13. Rx last filled on 05-29-13 for #40. Please advise

## 2014-01-27 NOTE — Progress Notes (Signed)
History of Present Illness: Kelly [name] is a 73 y.o. female who returns for the evaluation of edema.    She has a hx of NICM with mild LV dysfunction, HTN and HL. She has a history of normal coronary arteries by catheterization in 2002.  Echocardiogram in 2013 demonstrated normal LV function with mild mitral valve prolapse. Myoview in 2013 demonstrated normal perfusion.  I saw her in clinic in December  Wt Readings from Last 3 Encounters:  01/28/14 146 lb (66.225 kg)  11/13/13 147 lb 3.2 oz (66.769 kg)  10/22/13 145 lb (65.772 kg)     Past Medical History  Diagnosis Date  . HLD (hyperlipidemia)   . Migraine   . HTN (hypertension)   . NICM (nonischemic cardiomyopathy)     a. LHC 8/02: EF 55%, 2+ MR, normal cors;  b.  Echo 3/10: EF 50-55%, mild diast dysfn, mild MVP (ant leaflet), mild MR, mild TR, small pericard eff.;   c.  Myoview 3/10:  EF 69%, no ischemia;  d. Echo 9/13: EF 50-55%, Gr 1 diast dysfn, mild prolapse of ant MV leaflet;   e. Myoview 9/13: EF 64%, NL perfusion   . Pulmonary HTN   . Depression with anxiety     Current Outpatient Prescriptions  Medication Sig Dispense Refill  . aspirin 81 MG tablet Take 81 mg by mouth daily.      Marland Kitchen azithromycin (ZITHROMAX) 250 MG tablet Take as directed  6 tablet  0  . benzonatate (TESSALON) 100 MG capsule Take 1 capsule (100 mg total) by mouth 3 (three) times daily as needed.  30 capsule  0  . cholecalciferol (VITAMIN D) 1000 UNITS tablet Take 1,000 Units by mouth daily.      . furosemide (LASIX) 20 MG tablet Take 20 mg by mouth daily. May take extra if needed      . hydrocortisone (CORTEF) 20 MG tablet       . meclizine (ANTIVERT) 12.5 MG tablet TAKE ONE TABLET BY MOUTH EVERY 12 TO 24 HOURS AS NEEDED  40 tablet  0  . meloxicam (MOBIC) 15 MG tablet Take 15 mg by mouth daily.      . Multiple Vitamin (MULTIVITAMIN) tablet Take 1 tablet by mouth daily.      Marland Kitchen omeprazole (PRILOSEC) 40 MG capsule TAKE ONE CAPSULE BY MOUTH ONCE  DAILY  30 capsule  3  . potassium chloride SA (K-DUR,KLOR-CON) 20 MEQ tablet Take 0.5 tablets (10 mEq total) by mouth daily.      . valsartan (DIOVAN) 80 MG tablet TAKE ONE TABLET BY MOUTH ONCE DAILY  90 tablet  0   No current facility-administered medications for this visit.    Allergies: Allergies  Allergen Reactions  . Codeine Nausea And Vomiting  . Simvastatin    Social History:  The patient  reports that she has never smoked. She has never used smokeless tobacco. She reports that she does not drink alcohol or use illicit drugs.    PHYSICAL EXAM: VS:  BP 121/60  Pulse 60  Ht 5\' 2"  (1.575 m)  Wt 146 lb (66.225 kg)  BMI 26.70 kg/m2 Well nourished, well developed, in no acute distress HEENT: normal Neck: JVP is 9 Cardiac:  normal S1, S2; RRR; no murmur; no clicks Lungs:  clear to auscultation bilaterally, no wheezing, rhonchi or rales Abd: soft, nontender, no hepatomegaly Ext: no edema; multiple varicosities noted Skin: warm and dry Neuro:  CNs 2-12 intact, no focal abnormalities noted    ASSESSMENT  AND PLAN:  1. NICM  LVEF has recovered.  VOlume status looks good  Keep on same regimen 2. Hypertension: Adequate control 3. Hyperlipidemia  Continue meds.

## 2014-01-28 ENCOUNTER — Ambulatory Visit (INDEPENDENT_AMBULATORY_CARE_PROVIDER_SITE_OTHER): Payer: Medicare Other | Admitting: Internal Medicine

## 2014-01-28 ENCOUNTER — Encounter: Payer: Self-pay | Admitting: Internal Medicine

## 2014-01-28 VITALS — BP 121/60 | HR 60 | Ht 62.0 in | Wt 146.0 lb

## 2014-01-28 DIAGNOSIS — I428 Other cardiomyopathies: Secondary | ICD-10-CM

## 2014-01-28 DIAGNOSIS — I10 Essential (primary) hypertension: Secondary | ICD-10-CM

## 2014-01-28 DIAGNOSIS — E7800 Pure hypercholesterolemia, unspecified: Secondary | ICD-10-CM

## 2014-01-28 LAB — CBC
HEMATOCRIT: 37.5 % (ref 36.0–46.0)
HEMOGLOBIN: 13.2 g/dL (ref 12.0–15.0)
MCH: 31.6 pg (ref 26.0–34.0)
MCHC: 35.2 g/dL (ref 30.0–36.0)
MCV: 89.7 fL (ref 78.0–100.0)
Platelets: 198 10*3/uL (ref 150–400)
RBC: 4.18 MIL/uL (ref 3.87–5.11)
RDW: 13.8 % (ref 11.5–15.5)
WBC: 4.4 10*3/uL (ref 4.0–10.5)

## 2014-01-28 NOTE — Patient Instructions (Signed)
Your physician recommends that you have lab work today: BMP and CBC  Your physician wants you to follow-up in: 8 MONTHS with Dr Tenny Craw.  You will receive a reminder letter in the mail two months in advance. If you don't receive a letter, please call our office to schedule the follow-up appointment.  Your physician recommends that you continue on your current medications as directed. Please refer to the Current Medication list given to you today.

## 2014-01-29 LAB — BASIC METABOLIC PANEL
BUN: 17 mg/dL (ref 6–23)
CALCIUM: 9.4 mg/dL (ref 8.4–10.5)
CHLORIDE: 104 meq/L (ref 96–112)
CO2: 29 meq/L (ref 19–32)
Creat: 0.83 mg/dL (ref 0.50–1.10)
GLUCOSE: 83 mg/dL (ref 70–99)
Potassium: 3.7 mEq/L (ref 3.5–5.3)
SODIUM: 142 meq/L (ref 135–145)

## 2014-02-03 ENCOUNTER — Encounter: Payer: Self-pay | Admitting: Internal Medicine

## 2014-02-03 NOTE — Telephone Encounter (Signed)
This encounter was created in error - please disregard.

## 2014-02-03 NOTE — Telephone Encounter (Signed)
Patient is returning your call, said she tried to reach you yesterday. Please call back today.

## 2014-03-05 ENCOUNTER — Other Ambulatory Visit: Payer: Self-pay | Admitting: Physician Assistant

## 2014-03-18 ENCOUNTER — Other Ambulatory Visit: Payer: Self-pay | Admitting: Internal Medicine

## 2014-04-28 ENCOUNTER — Other Ambulatory Visit: Payer: Self-pay | Admitting: Internal Medicine

## 2014-05-03 ENCOUNTER — Other Ambulatory Visit: Payer: Self-pay | Admitting: Nurse Practitioner

## 2014-05-03 DIAGNOSIS — Z13820 Encounter for screening for osteoporosis: Secondary | ICD-10-CM

## 2014-05-17 ENCOUNTER — Telehealth: Payer: Self-pay | Admitting: Nurse Practitioner

## 2014-05-17 NOTE — Telephone Encounter (Signed)
Would you like to order labs or have patient wait until appt?

## 2014-05-18 NOTE — Telephone Encounter (Signed)
Patient aware.

## 2014-05-18 NOTE — Telephone Encounter (Signed)
Just let her wait and will do at appointment

## 2014-06-01 ENCOUNTER — Telehealth: Payer: Self-pay | Admitting: Internal Medicine

## 2014-06-01 NOTE — Telephone Encounter (Signed)
New Message  Pt called states that she is holding a lot of fluid and  Experiences SOB sometimes (Not currently) . she is requesting a call back to discuss// Please call

## 2014-06-01 NOTE — Telephone Encounter (Signed)
Agree 

## 2014-06-01 NOTE — Telephone Encounter (Signed)
Pt has had worsening SOB for about 6 months.  She said she had a mammogram a couple months ago and ever since then she has had a constant cramp (pain 4-5/10) under her L breast. Pt st she does not have a scale, but assumes she has gained weight. She can tell she is swollen when she takes her shoes and socks off. She st sometimes she wheezes. Patient takes 1-2 Lasix 20mg  daily. Instructed patient to take her second one. Patient not SOB currently. Spoke with Norma Fredrickson, and per Lawson Fiscal made an appointment 10/28 with Tereso Newcomer. Instructed patient to call the office with any questions, concerns or worsening symptoms until then.

## 2014-06-09 ENCOUNTER — Other Ambulatory Visit: Payer: Medicare Other | Admitting: Nurse Practitioner

## 2014-06-10 ENCOUNTER — Ambulatory Visit (INDEPENDENT_AMBULATORY_CARE_PROVIDER_SITE_OTHER): Payer: Medicare Other

## 2014-06-10 DIAGNOSIS — Z23 Encounter for immunization: Secondary | ICD-10-CM

## 2014-06-15 ENCOUNTER — Encounter: Payer: Self-pay | Admitting: Physician Assistant

## 2014-06-15 ENCOUNTER — Ambulatory Visit (INDEPENDENT_AMBULATORY_CARE_PROVIDER_SITE_OTHER): Payer: Medicare Other | Admitting: Physician Assistant

## 2014-06-15 VITALS — BP 105/65 | HR 64 | Ht 62.0 in | Wt 146.0 lb

## 2014-06-15 DIAGNOSIS — R609 Edema, unspecified: Secondary | ICD-10-CM

## 2014-06-15 DIAGNOSIS — I10 Essential (primary) hypertension: Secondary | ICD-10-CM

## 2014-06-15 DIAGNOSIS — I428 Other cardiomyopathies: Secondary | ICD-10-CM

## 2014-06-15 DIAGNOSIS — R0602 Shortness of breath: Secondary | ICD-10-CM

## 2014-06-15 DIAGNOSIS — I429 Cardiomyopathy, unspecified: Secondary | ICD-10-CM

## 2014-06-15 LAB — CBC
HCT: 41.6 % (ref 36.0–46.0)
Hemoglobin: 14 g/dL (ref 12.0–15.0)
MCHC: 33.5 g/dL (ref 30.0–36.0)
MCV: 91.7 fl (ref 78.0–100.0)
Platelets: 205 10*3/uL (ref 150.0–400.0)
RBC: 4.54 Mil/uL (ref 3.87–5.11)
RDW: 13.4 % (ref 11.5–15.5)
WBC: 6.7 10*3/uL (ref 4.0–10.5)

## 2014-06-15 LAB — BASIC METABOLIC PANEL
BUN: 17 mg/dL (ref 6–23)
CO2: 28 mEq/L (ref 19–32)
CREATININE: 0.8 mg/dL (ref 0.4–1.2)
Calcium: 9.6 mg/dL (ref 8.4–10.5)
Chloride: 103 mEq/L (ref 96–112)
GFR: 70.13 mL/min (ref >60.00)
GLUCOSE: 78 mg/dL (ref 70–99)
POTASSIUM: 3.8 meq/L (ref 3.5–5.1)
Sodium: 139 mEq/L (ref 135–145)

## 2014-06-15 LAB — TSH: TSH: 0.62 u[IU]/mL (ref 0.35–4.50)

## 2014-06-15 LAB — BRAIN NATRIURETIC PEPTIDE: PRO B NATRI PEPTIDE: 53 pg/mL (ref 0.0–100.0)

## 2014-06-15 MED ORDER — FUROSEMIDE 20 MG PO TABS: ORAL_TABLET | ORAL | Status: DC

## 2014-06-15 NOTE — Progress Notes (Signed)
Cardiology Office Note   Date:  06/15/2014   ID:  Kelly MannersSally F Hemberger, DOB 09/06/1940, MRN [id number]  PCP:  Rudi HeapMOORE, DONALD, MD  Cardiologist:  Dr. Dietrich PatesPaula Ross     History of Present Illness: Kelly [name] is a 73 y.o. female with a hx of NICM with mild LV dysfunction, HTN and HL. EF has returned to normal.  She has a history of normal coronary arteries by catheterization in 2002. Echocardiogram in 2013 demonstrated normal LV function with mild mitral valve prolapse. Myoview in 2013 demonstrated normal perfusion.  Last seen by Dr. Dietrich PatesPaula Ross 01/2014.    She recently call in with worsening dyspnea over 6 mos.  She returns for FU.  Patient tells me that she had a recent mammogram. This was quite painful. For several days afterward, she had left-sided chest discomfort. This was worse with palpation and improved with ice. She denies exertional symptoms or associated dyspnea, nausea, diaphoresis. She notes increasing dyspnea on exertion. She is NYHA 2-2b. She denies orthopnea, PND or significant pedal edema. She has an occasional wheeze. She denies any cough.   Recent Labs/Images:  Recent Labs  07/22/13 1415 01/28/14 1654  NA 139 142  K 3.7 3.7  BUN 17 17  CREATININE 0.9 0.83  HGB  --  13.2  PROBNP 93.0  --       Wt Readings from Last 3 Encounters:  06/15/14 146 lb (66.225 kg)  01/28/14 146 lb (66.225 kg)  11/13/13 147 lb 3.2 oz (66.769 kg)     Past Medical History  Diagnosis Date  . HLD (hyperlipidemia)   . Migraine   . HTN (hypertension)   . NICM (nonischemic cardiomyopathy)     a. LHC 8/02: EF 55%, 2+ MR, normal cors;  b.  Echo 3/10: EF 50-55%, mild diast dysfn, mild MVP (ant leaflet), mild MR, mild TR, small pericard eff.;   c.  Myoview 3/10:  EF 69%, no ischemia;  d. Echo 9/13: EF 50-55%, Gr 1 diast dysfn, mild prolapse of ant MV leaflet;   e. Myoview 9/13: EF 64%, NL perfusion   . Pulmonary HTN   . Depression with anxiety     Current Outpatient Prescriptions    Medication Sig Dispense Refill  . aspirin 81 MG tablet Take 81 mg by mouth daily.      . cholecalciferol (VITAMIN D) 1000 UNITS tablet Take 1,000 Units by mouth daily.      . furosemide (LASIX) 20 MG tablet TAKE ONE TO TWO TABLETS BY MOUTH ONCE DAILY AS  DIRECTED  60 tablet  6  . meclizine (ANTIVERT) 12.5 MG tablet TAKE ONE TABLET BY MOUTH EVERY 12 TO 24 HOURS AS NEEDED  40 tablet  0  . omeprazole (PRILOSEC) 40 MG capsule TAKE ONE CAPSULE BY MOUTH AS NEEDED      . valsartan (DIOVAN) 80 MG tablet TAKE ONE TABLET BY MOUTH ONCE DAILY  90 tablet  1   No current facility-administered medications for this visit.     Allergies:   Codeine and Simvastatin   Social History:  The patient  reports that she has never smoked. She has never used smokeless tobacco. She reports that she does not drink alcohol or use illicit drugs. 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  Family History:  The patient's family history includes Asthma in her mother; COPD in her mother; Cancer in her sister; Heart attack in her brother; Heart disease in her brother and mother; Thrombosis in her  father. There is no history of Stroke.   ROS:  Please see the history of present illness.       All other systems reviewed and negative.    PHYSICAL EXAM: VS:  BP 105/65  Pulse 64  Ht 5\' 2"  (1.575 m)  Wt 146 lb (66.225 kg)  BMI 26.70 kg/m2 Well nourished, well developed, in no acute distress HEENT: normal Neck:   no JVD Cardiac:  normal S1, S2;  RRR; no murmur Lungs:   clear to auscultation bilaterally, no wheezing, rhonchi or rales Abd: soft, nontender, no hepatomegaly Ext: Trace bilateral LE  edema Skin: warm and dry Neuro:  CNs 2-12 intact, no focal abnormalities noted  EKG:  NSR, HR 64, normal axis, low voltage, anteroseptal Q waves, no change from prior tracings      ASSESSMENT AND PLAN:  1.  Shortness of breath :  Etiology not clear. She does not appear to be volume overloaded on exam. She  recently took extra Lasix and felt better with this.   -  Labs: BMET, CBC, TSH, BNP.   -  Increase Lasix to 40 mg Monday, Wednesday, Friday and 20 mg all other days   -  Repeat BMET in 10 days.    -  Check echocardiogram.  2.  Edema:  As above, her Lasix will be slightly adjusted. Echocardiogram will be obtained. TSH will be obtained.  3.  NICM (nonischemic cardiomyopathy):  She has had full recovery of LV function in the past. Repeat echocardiogram as noted above. Continue ARB.  4.  Essential hypertension:  Controlled.    Disposition:   FU with  Dr. Tenny Craw or me 6 weeks    Signed, Brynda Rim, MHS 06/15/2014 11:14 AM    [facility] [address] [place], [place], [place]  [address] Phone: [phone]; Fax: [phone]

## 2014-06-15 NOTE — Patient Instructions (Addendum)
Your physician recommends that you schedule a follow-up appointment in: 6 weeks with Dr. Tenny Craw or Tereso Newcomer, PA  Your physician recommends that you return for lab work in: 10 days--BMP--June 24, 2014  Lab work to be done today--BMP, CBC, TSH, BNP   Your physician has requested that you have an echocardiogram. Echocardiography is a painless test that uses sound waves to create images of your heart. It provides your doctor with information about the size and shape of your heart and how well your heart's chambers and valves are working. This procedure takes approximately one hour. There are no restrictions for this procedure.  Your physician has recommended you make the following change in your medication:   Change furosemide to 40 mg daily on Monday, Wednesday and Friday. The other days take 20 mg daily.  May continue to take extra 20 mg daily as needed for weight gain. (greater than 3 lbs weight gain in one day)

## 2014-06-24 ENCOUNTER — Ambulatory Visit (HOSPITAL_COMMUNITY): Payer: Medicare Other | Attending: Internal Medicine | Admitting: Radiology

## 2014-06-24 ENCOUNTER — Telehealth: Payer: Self-pay | Admitting: *Deleted

## 2014-06-24 ENCOUNTER — Other Ambulatory Visit (INDEPENDENT_AMBULATORY_CARE_PROVIDER_SITE_OTHER): Payer: Medicare Other | Admitting: *Deleted

## 2014-06-24 ENCOUNTER — Encounter: Payer: Self-pay | Admitting: Physician Assistant

## 2014-06-24 DIAGNOSIS — E780 Pure hypercholesterolemia: Secondary | ICD-10-CM | POA: Diagnosis not present

## 2014-06-24 DIAGNOSIS — I428 Other cardiomyopathies: Secondary | ICD-10-CM

## 2014-06-24 DIAGNOSIS — I429 Cardiomyopathy, unspecified: Secondary | ICD-10-CM

## 2014-06-24 DIAGNOSIS — I10 Essential (primary) hypertension: Secondary | ICD-10-CM | POA: Insufficient documentation

## 2014-06-24 DIAGNOSIS — R609 Edema, unspecified: Secondary | ICD-10-CM

## 2014-06-24 DIAGNOSIS — R0602 Shortness of breath: Secondary | ICD-10-CM | POA: Diagnosis present

## 2014-06-24 LAB — BASIC METABOLIC PANEL
BUN: 16 mg/dL (ref 6–23)
CHLORIDE: 105 meq/L (ref 96–112)
CO2: 26 mEq/L (ref 19–32)
Calcium: 9.4 mg/dL (ref 8.4–10.5)
Creatinine, Ser: 0.9 mg/dL (ref 0.4–1.2)
GFR: 65.6 mL/min (ref >60.00)
Glucose, Bld: 80 mg/dL (ref 70–99)
POTASSIUM: 4 meq/L (ref 3.5–5.1)
SODIUM: 140 meq/L (ref 135–145)

## 2014-06-24 NOTE — Progress Notes (Signed)
Echocardiogram performed.  

## 2014-06-24 NOTE — Telephone Encounter (Signed)
pt notified about echo and lab results with verbal understanding

## 2014-07-27 ENCOUNTER — Ambulatory Visit: Payer: Medicare Other

## 2014-07-27 ENCOUNTER — Other Ambulatory Visit: Payer: Medicare Other

## 2014-07-27 ENCOUNTER — Ambulatory Visit (INDEPENDENT_AMBULATORY_CARE_PROVIDER_SITE_OTHER): Payer: Medicare Other | Admitting: Physician Assistant

## 2014-07-27 ENCOUNTER — Encounter: Payer: Self-pay | Admitting: Physician Assistant

## 2014-07-27 VITALS — BP 120/70 | HR 70 | Ht 62.0 in | Wt 147.0 lb

## 2014-07-27 DIAGNOSIS — R0602 Shortness of breath: Secondary | ICD-10-CM

## 2014-07-27 DIAGNOSIS — I428 Other cardiomyopathies: Secondary | ICD-10-CM

## 2014-07-27 DIAGNOSIS — I429 Cardiomyopathy, unspecified: Secondary | ICD-10-CM

## 2014-07-27 DIAGNOSIS — I10 Essential (primary) hypertension: Secondary | ICD-10-CM

## 2014-07-27 NOTE — Progress Notes (Signed)
Cardiology Office Note   Date:  07/27/2014   ID:  Kelly [name], DOB 21-Dec-1940, MRN [id number]  PCP:  Rudi Heap, MD  Cardiologist:  Dr. Dietrich Pates     History of Present Illness: Kelly [name] is a 73 y.o. female with a hx of NICM with mild LV dysfunction, HTN and HL. EF has returned to normal.  She has a history of normal coronary arteries by catheterization in 2002. Echocardiogram in 2013 demonstrated normal LV function with mild mitral valve prolapse. Myoview in 2013 demonstrated normal perfusion.  Last seen by Dr. Dietrich Pates 01/2014.    I saw her 06/15/14 with complaints of increased dyspnea.  She felt better with taking extra Lasix.  I adjusted her Lasix for her.  She underwent an Echocardiogram that demonstrated normal LVF.  She returns for FU.  She is doing well.  The patient denies chest pain, shortness of breath, syncope, orthopnea, PND or significant pedal edema.    Studies:   - Echo (11/15):   EF 50-55%, normal diastolic function, mild MR    Recent Labs/Images:  Recent Labs  06/15/14 1219 06/24/14 0834  NA 139 140  K 3.8 4.0  BUN 17 16  CREATININE 0.8 0.9  HGB 14.0  --   TSH 0.62  --   PROBNP 53.0  --       Wt Readings from Last 3 Encounters:  07/27/14 147 lb (66.679 kg)  06/15/14 146 lb (66.225 kg)  01/28/14 146 lb (66.225 kg)     Past Medical History  Diagnosis Date  . HLD (hyperlipidemia)   . Migraine   . HTN (hypertension)   . NICM (nonischemic cardiomyopathy)     a. LHC 8/02: EF 55%, 2+ MR, normal cors;  b.  Echo 3/10: EF 50-55%, mild diast dysfn, mild MVP (ant leaflet), mild MR, mild TR, small pericard eff.;   c.  Myoview 3/10:  EF 69%, no ischemia;  d. Echo 9/13: EF 50-55%, Gr 1 diast dysfn, mild prolapse of ant MV leaflet;   e. Myoview 9/13: EF 64%, NL perfusion;  f.  Echo (11/15):  EF 50-55%, normal diastolic function, mild MR  . Pulmonary HTN   . Depression with anxiety     Current Outpatient Prescriptions  Medication Sig Dispense  Refill  . aspirin 81 MG tablet Take 81 mg by mouth daily.    . cholecalciferol (VITAMIN D) 1000 UNITS tablet Take 1,000 Units by mouth daily.    . furosemide (LASIX) 20 MG tablet Take 2 tablets on Mon, Wed, Fri. Other days take one tablet. May take extra tablet as needed for weight gain/swelling 60 tablet 6  . meclizine (ANTIVERT) 12.5 MG tablet TAKE ONE TABLET BY MOUTH EVERY 12 TO 24 HOURS AS NEEDED 40 tablet 0  . omeprazole (PRILOSEC) 40 MG capsule TAKE ONE CAPSULE BY MOUTH AS NEEDED    . valsartan (DIOVAN) 80 MG tablet TAKE ONE TABLET BY MOUTH ONCE DAILY 90 tablet 1   No current facility-administered medications for this visit.     Allergies:   Codeine and Simvastatin   Social History:  The patient  reports that she has never smoked. She has never used smokeless tobacco. She reports that she does not drink alcohol or use illicit drugs. 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  Family History:  The patient's family history includes Asthma in her mother; COPD in her mother; Cancer in her sister; Heart attack in her brother; Heart disease in  her brother and mother; Thrombosis in her father. There is no history of Stroke.   ROS:  Please see the history of present illness.    All other systems reviewed and negative.    PHYSICAL EXAM: VS:  BP 120/70 mmHg  Pulse 70  Ht 5\' 2"  (1.575 m)  Wt 147 lb (66.679 kg)  BMI 26.88 kg/m2  SpO2 96% Well nourished, well developed, in no acute distress HEENT: normal Neck:   no JVD Cardiac:  normal S1, S2;  RRR; no murmur Lungs:   clear to auscultation bilaterally, no wheezing, rhonchi or rales Abd: soft, nontender, no hepatomegaly Ext: Trace bilateral LE  edema Skin: warm and dry Neuro:  CNs 2-12 intact, no focal abnormalities noted    ASSESSMENT AND PLAN:  1.  Shortness of breath:  Improved on higher dose of Lasix.  Recent echo with normal LVF.  Continue current Rx. 2.  NICM (nonischemic cardiomyopathy):  EF remains normal.  Continue ARB. 3.  Essential hypertension:  Controlled.     Disposition:   FU with  Dr. Tenny Crawoss 01/2015 as planned.    Signed, Brynda RimScott Vernie Vinciguerra, PA-C, MHS 07/27/2014 10:57 AM    [facility] [address], [place], [place]  [address] Phone: [phone]; Fax: [phone]

## 2014-07-27 NOTE — Patient Instructions (Signed)
Continue your same medications.  Schedule a follow up with Dr. Dietrich Pates in 01/2015.  You will get a card in the mail to call 2-3 months before this.

## 2014-09-16 ENCOUNTER — Other Ambulatory Visit: Payer: Self-pay | Admitting: Internal Medicine

## 2014-09-22 ENCOUNTER — Encounter: Payer: Self-pay | Admitting: Internal Medicine

## 2014-09-23 DIAGNOSIS — H40033 Anatomical narrow angle, bilateral: Secondary | ICD-10-CM | POA: Diagnosis not present

## 2014-09-23 DIAGNOSIS — H43393 Other vitreous opacities, bilateral: Secondary | ICD-10-CM | POA: Diagnosis not present

## 2014-09-28 ENCOUNTER — Encounter (INDEPENDENT_AMBULATORY_CARE_PROVIDER_SITE_OTHER): Payer: Medicare Other | Admitting: Ophthalmology

## 2014-09-28 DIAGNOSIS — H43813 Vitreous degeneration, bilateral: Secondary | ICD-10-CM | POA: Diagnosis not present

## 2014-11-24 ENCOUNTER — Ambulatory Visit: Payer: Medicare Other | Admitting: *Deleted

## 2014-12-30 ENCOUNTER — Ambulatory Visit (INDEPENDENT_AMBULATORY_CARE_PROVIDER_SITE_OTHER): Payer: Medicare Other | Admitting: Physician Assistant

## 2014-12-30 ENCOUNTER — Ambulatory Visit (INDEPENDENT_AMBULATORY_CARE_PROVIDER_SITE_OTHER): Payer: Medicare Other

## 2014-12-30 VITALS — BP 117/64 | HR 64 | Temp 97.2°F | Ht 62.0 in

## 2014-12-30 DIAGNOSIS — S62609A Fracture of unspecified phalanx of unspecified finger, initial encounter for closed fracture: Secondary | ICD-10-CM | POA: Diagnosis not present

## 2014-12-30 DIAGNOSIS — M79645 Pain in left finger(s): Secondary | ICD-10-CM

## 2014-12-30 NOTE — Progress Notes (Signed)
   Subjective:    Patient ID: SHANAN MCGLOTHEN, female    DOB: Apr 25, 1941, 74 y.o.   MRN: [id number]  HPI 74 y/o female presetns with c/o pain, bruising and swelling of left hand, 4th finger s/p falling yesterday and hitting her finger over dresser. She hit the palmar surface of her finger, causing distal joint extension. She presents with a splint today and has used ice 2-3 times since fall.     Review of Systems  Musculoskeletal:       Pain, swelling in left hand, 4th finger, middle/distal third.        Objective:   Physical Exam  Musculoskeletal:  Decreased ROM on finger flexion d/t edema, however patient is able to move Mild edema and ecchymosis in middle to distal third of 4th finger, left hand.  Probable fracture in distal phalynx, no joint  Injury suspected based on xray.           Assessment & Plan:  1. Finger pain, left  - DG Finger Ring Left; Future  2. Finger fracture, left, closed, initial encounter - Keep splint in place 24 hrs q day. Patient declined pain med so I advised her to take ibuprofen or aleve as directed for pain/inflammation.  - Ice for the first 2 days, 20 min each time 3-4 times daily.  - F/U in 2 weeks for recheck or sooner if worsens or does not improve - will order an MRI if needed. Patient prefers conservative treatment so I will defer MRI at this time.      Tiffany A. Chauncey Reading PA-C

## 2015-01-11 ENCOUNTER — Ambulatory Visit (INDEPENDENT_AMBULATORY_CARE_PROVIDER_SITE_OTHER): Payer: Medicare Other | Admitting: Family Medicine

## 2015-01-11 ENCOUNTER — Encounter: Payer: Self-pay | Admitting: Family Medicine

## 2015-01-11 VITALS — BP 132/62 | HR 70 | Temp 97.6°F | Ht 62.0 in | Wt 148.8 lb

## 2015-01-11 DIAGNOSIS — R109 Unspecified abdominal pain: Secondary | ICD-10-CM

## 2015-01-11 DIAGNOSIS — R1011 Right upper quadrant pain: Secondary | ICD-10-CM

## 2015-01-11 MED ORDER — CELECOXIB 200 MG PO CAPS: 200.0000 mg | ORAL_CAPSULE | Freq: Every day | ORAL | Status: DC

## 2015-01-11 NOTE — Progress Notes (Signed)
Subjective:  Patient ID: Kelly [name], female    DOB: 10-08-1940  Age: 74 y.o. MRN: [id number]  CC: Back Pain   HPI Kelly [name] presents for right lower back pain present for several days increasing in severity. Now 5/10 in intensity and constant and dull. Not associated with any abdominal symptoms such as nausea vomiting and diarrhea. However the pain does seem to radiate somewhat into the right flank area near the posterior aspect of the right kidney. She denies any frequency urgency or dysuria. No fever chills or sweats.  History Kelly [name] has a past medical history of HLD (hyperlipidemia); Migraine; HTN (hypertension); NICM (nonischemic cardiomyopathy); Pulmonary HTN; and Depression with anxiety.   She has past surgical history that includes Abdominal hysterectomy; Cholecystectomy; Appendectomy; and Eye surgery.   Her family history includes Asthma in her mother; COPD in her mother; Cancer in her sister; Heart attack in her brother; Heart disease in her brother and mother; Thrombosis in her father. There is no history of Stroke.She reports that she has never smoked. She has never used smokeless tobacco. She reports that she does not drink alcohol or use illicit drugs.  Outpatient Prescriptions Prior to Visit  Medication Sig Dispense Refill  . aspirin 81 MG tablet Take 81 mg by mouth daily.    . cholecalciferol (VITAMIN D) 1000 UNITS tablet Take 1,000 Units by mouth daily.    . furosemide (LASIX) 20 MG tablet Take 2 tablets on Mon, Wed, Fri. Other days take one tablet. May take extra tablet as needed for weight gain/swelling 60 tablet 6  . meclizine (ANTIVERT) 12.5 MG tablet TAKE ONE TABLET BY MOUTH EVERY 12 TO 24 HOURS AS NEEDED 40 tablet 0  . omeprazole (PRILOSEC) 40 MG capsule TAKE ONE CAPSULE BY MOUTH AS NEEDED    . valsartan (DIOVAN) 80 MG tablet TAKE ONE TABLET BY MOUTH ONCE DAILY 90 tablet 1   No facility-administered medications prior to visit.    ROS Review of Systems    Constitutional: Negative for fever, chills, diaphoresis, appetite change, fatigue and unexpected weight change.  HENT: Negative for congestion, ear pain, hearing loss, postnasal drip, rhinorrhea, sneezing, sore throat and trouble swallowing.   Eyes: Negative for pain.  Respiratory: Negative for cough, chest tightness and shortness of breath.   Cardiovascular: Negative for chest pain and palpitations.  Gastrointestinal: Negative for nausea, vomiting, abdominal pain, diarrhea and constipation.  Genitourinary: Positive for flank pain. Negative for dysuria, frequency and menstrual problem.  Musculoskeletal: Negative for joint swelling and arthralgias.  Skin: Negative for rash.  Neurological: Negative for dizziness, weakness, numbness and headaches.  Psychiatric/Behavioral: Negative for dysphoric mood and agitation.    Objective:  BP 132/62 mmHg  Pulse 70  Temp(Src) 97.6 F (36.4 C) (Oral)  Ht _0  (1.575 m)  Wt 148 lb 12.8 oz (67.495 kg)  BMI 27.21 kg/m2  BP Readings from Last 3 Encounters:  01/11/15 132/62  12/30/14 117/64  07/27/14 120/70    Wt Readings from Last 3 Encounters:  01/11/15 148 lb 12.8 oz (67.495 kg)  07/27/14 147 lb (66.679 kg)  06/15/14 146 lb (66.225 kg)     Physical Exam  Constitutional: She is oriented to person, place, and time. She appears well-developed and well-nourished. No distress.  HENT:  Head: Normocephalic and atraumatic.  Right Ear: External ear normal.  Left Ear: External ear normal.  Nose: Nose normal.  Mouth/Throat: Oropharynx is clear and moist.  Eyes: Conjunctivae and EOM are normal. Pupils are equal, round,  and reactive to light.  Neck: Normal range of motion. Neck supple. No thyromegaly present.  Cardiovascular: Normal rate, regular rhythm and normal heart sounds.   No murmur heard. Pulmonary/Chest: Effort normal and breath sounds normal. No respiratory distress. She has no wheezes. She has no rales.  Abdominal: Soft. Bowel sounds  are normal. She exhibits no distension. There is no tenderness.  Lymphadenopathy:    She has no cervical adenopathy.  Neurological: She is alert and oriented to person, place, and time. She has normal reflexes.  Skin: Skin is warm and dry.  Psychiatric: She has a normal mood and affect. Her behavior is normal. Judgment and thought content normal.    No results found for: HGBA1C  Lab Results  Component Value Date   WBC 5.6 01/12/2015   HGB 13.6 01/12/2015   HCT 44.2 01/12/2015   PLT 205.0 06/15/2014   GLUCOSE 82 01/12/2015   CHOL 119 10/22/2010   TRIG 73.0 10/22/2010   HDL 46.80 10/22/2010   LDLDIRECT 151.3 08/16/2010   LDLCALC 58 10/22/2010   ALT 24 01/12/2015   AST 29 01/12/2015   NA 146* 01/12/2015   K 4.1 01/12/2015   CL 103 01/12/2015   CREATININE 0.80 01/12/2015   BUN 14 01/12/2015   CO2 27 01/12/2015   TSH 0.62 06/15/2014    Dg Chest 2 View  04/23/2012   *RADIOLOGY REPORT*  Clinical Data: COPD and chest pain  CHEST - 2 VIEW  Comparison: 06/03/06  Findings: Heart size appears mildly enlarged.  No pleural effusion or edema.  There are coarsened interstitial markings noted bilaterally.  The lungs appear hyperinflated.  No airspace consolidation noted.  IMPRESSION:  1.  No acute cardiopulmonary abnormalities.   Original Report Authenticated By: Angelita Ingles, M.D.    Assessment & Plan:   Kelly [name] was seen today for back pain.  Diagnoses and all orders for this visit:  Acute right flank pain Orders: -     POCT CBC -     CMP14+EGFR -     Lipase -     DG Abd 2 Views; Future -     POCT UA - Microscopic Only -     POCT urinalysis dipstick  Other orders -     celecoxib (CELEBREX) 200 MG capsule; Take 1 capsule (200 mg total) by mouth daily. With food for back pain   I am having [name] start on celecoxib. I am also having her maintain her aspirin, cholecalciferol, meclizine, omeprazole, furosemide, and valsartan.  Meds ordered this encounter  Medications  .  celecoxib (CELEBREX) 200 MG capsule    Sig: Take 1 capsule (200 mg total) by mouth daily. With food for back pain    Dispense:  30 capsule    Refill:  2     Follow-up: Return in about 1 month (around 02/11/2015).  Claretta Fraise, M.D.

## 2015-01-12 ENCOUNTER — Other Ambulatory Visit: Payer: Medicare Other

## 2015-01-12 ENCOUNTER — Other Ambulatory Visit (INDEPENDENT_AMBULATORY_CARE_PROVIDER_SITE_OTHER): Payer: Medicare Other

## 2015-01-12 DIAGNOSIS — R1011 Right upper quadrant pain: Secondary | ICD-10-CM | POA: Diagnosis not present

## 2015-01-12 DIAGNOSIS — R109 Unspecified abdominal pain: Secondary | ICD-10-CM

## 2015-01-12 LAB — POCT UA - MICROSCOPIC ONLY
Casts, Ur, LPF, POC: NEGATIVE
Crystals, Ur, HPF, POC: NEGATIVE
YEAST UA: NEGATIVE

## 2015-01-12 LAB — POCT CBC
Granulocyte percent: 68.4 %G (ref 37–80)
HCT, POC: 44.2 % (ref 37.7–47.9)
HEMOGLOBIN: 13.6 g/dL (ref 12.2–16.2)
Lymph, poc: 1.5 (ref 0.6–3.4)
MCH, POC: 28.5 pg (ref 27–31.2)
MCHC: 30.7 g/dL — AB (ref 31.8–35.4)
MCV: 92.7 fL (ref 80–97)
MPV: 7.9 fL (ref 0–99.8)
POC Granulocyte: 3.8 (ref 2–6.9)
POC LYMPH PERCENT: 26.3 %L (ref 10–50)
Platelet Count, POC: 198 10*3/uL (ref 142–424)
RBC: 4.77 M/uL (ref 4.04–5.48)
RDW, POC: 13.2 %
WBC: 5.6 10*3/uL (ref 4.6–10.2)

## 2015-01-12 LAB — POCT URINALYSIS DIPSTICK
BILIRUBIN UA: NEGATIVE
GLUCOSE UA: NEGATIVE
KETONES UA: NEGATIVE
LEUKOCYTES UA: NEGATIVE
Nitrite, UA: NEGATIVE
PH UA: 5
Spec Grav, UA: 1.025
UROBILINOGEN UA: NEGATIVE

## 2015-01-12 NOTE — Progress Notes (Signed)
Labs only yesterdays orderd

## 2015-01-13 LAB — CMP14+EGFR
ALBUMIN: 4.3 g/dL (ref 3.5–4.8)
ALT: 24 IU/L (ref 0–32)
AST: 29 IU/L (ref 0–40)
Albumin/Globulin Ratio: 2 (ref 1.1–2.5)
Alkaline Phosphatase: 64 IU/L (ref 39–117)
BILIRUBIN TOTAL: 1.3 mg/dL — AB (ref 0.0–1.2)
BUN/Creatinine Ratio: 18 (ref 11–26)
BUN: 14 mg/dL (ref 8–27)
CHLORIDE: 103 mmol/L (ref 97–108)
CO2: 27 mmol/L (ref 18–29)
Calcium: 9.3 mg/dL (ref 8.7–10.3)
Creatinine, Ser: 0.8 mg/dL (ref 0.57–1.00)
GFR calc Af Amer: 83 mL/min/{1.73_m2} (ref >59)
GFR calc non Af Amer: 72 mL/min/{1.73_m2} (ref >59)
GLOBULIN, TOTAL: 2.2 g/dL (ref 1.5–4.5)
Glucose: 82 mg/dL (ref 65–99)
Potassium: 4.1 mmol/L (ref 3.5–5.2)
Sodium: 146 mmol/L — ABNORMAL HIGH (ref 134–144)
Total Protein: 6.5 g/dL (ref 6.0–8.5)

## 2015-01-13 LAB — LIPASE: Lipase: 23 U/L (ref 0–59)

## 2015-02-16 ENCOUNTER — Encounter: Payer: Self-pay | Admitting: Nurse Practitioner

## 2015-02-16 ENCOUNTER — Ambulatory Visit (INDEPENDENT_AMBULATORY_CARE_PROVIDER_SITE_OTHER): Payer: Medicare Other | Admitting: Nurse Practitioner

## 2015-02-16 VITALS — BP 118/60 | HR 66 | Temp 97.0°F | Ht 62.0 in | Wt 148.4 lb

## 2015-02-16 DIAGNOSIS — Z01419 Encounter for gynecological examination (general) (routine) without abnormal findings: Secondary | ICD-10-CM

## 2015-02-16 DIAGNOSIS — N3000 Acute cystitis without hematuria: Secondary | ICD-10-CM

## 2015-02-16 DIAGNOSIS — I272 Pulmonary hypertension, unspecified: Secondary | ICD-10-CM

## 2015-02-16 DIAGNOSIS — I270 Primary pulmonary hypertension: Secondary | ICD-10-CM | POA: Diagnosis not present

## 2015-02-16 DIAGNOSIS — E7800 Pure hypercholesterolemia, unspecified: Secondary | ICD-10-CM

## 2015-02-16 DIAGNOSIS — F411 Generalized anxiety disorder: Secondary | ICD-10-CM

## 2015-02-16 LAB — POCT URINALYSIS DIPSTICK
Bilirubin, UA: NEGATIVE
GLUCOSE UA: NEGATIVE
KETONES UA: NEGATIVE
NITRITE UA: NEGATIVE
PROTEIN UA: NEGATIVE
RBC UA: NEGATIVE
Spec Grav, UA: 1.025
Urobilinogen, UA: NEGATIVE
pH, UA: 5

## 2015-02-16 LAB — POCT UA - MICROSCOPIC ONLY
BACTERIA, U MICROSCOPIC: NEGATIVE
CASTS, UR, LPF, POC: NEGATIVE
Crystals, Ur, HPF, POC: NEGATIVE
Mucus, UA: NEGATIVE
RBC, urine, microscopic: NEGATIVE
Yeast, UA: NEGATIVE

## 2015-02-16 MED ORDER — CLONAZEPAM 0.5 MG PO TABS: ORAL_TABLET | ORAL | Status: DC

## 2015-02-16 NOTE — Addendum Note (Signed)
Addended by: Prescott Gum on: 02/16/2015 12:13 PM   Modules accepted: Orders, SmartSet

## 2015-02-16 NOTE — Addendum Note (Signed)
Addended by: Prescott Gum on: 02/16/2015 12:10 PM   Modules accepted: Kipp Brood

## 2015-02-16 NOTE — Progress Notes (Signed)
Subjective:    Patient ID: Kelly [name], female    DOB: Jun 16, 1941, 74 y.o.   MRN: [id number]   Patient here today for annual physical exam and pap- She has not been seen in awile. Her  Only complaint is anxiety- has a lot  Of family things going on and she worries all the time.  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 The current episode started more than 1 year ago. The problem is unchanged. The problem is controlled. Pertinent negatives include no chest pain. Risk factors for coronary artery disease include dyslipidemia, obesity, post-menopausal state and sedentary lifestyle. Past treatments include angiotensin blockers. The current treatment provides moderate improvement. Compliance problems include diet and exercise.   Hyperlipidemia This is a chronic problem. The current episode started more than 1 year ago. The problem is controlled. Recent lipid tests were reviewed and are variable. Pertinent negatives include no chest pain, focal sensory loss or leg pain. She is currently on no antihyperlipidemic treatment. The current treatment provides moderate improvement of lipids. Compliance problems include adherence to diet and adherence to exercise.  Risk factors for coronary artery disease include hypertension, post-menopausal and a sedentary lifestyle.      Review of Systems  Constitutional: Negative.   HENT: Negative.   Respiratory: Negative.   Cardiovascular: Negative for chest pain.  Genitourinary: Negative.   Musculoskeletal: Negative.   Neurological: Negative.   Psychiatric/Behavioral: Negative.   All other systems reviewed and are negative.      Objective:   Physical Exam  Constitutional: She is oriented to person, place, and time. She appears well-developed and well-nourished.  HENT:  Head: Normocephalic.  Right Ear: Hearing, tympanic membrane, external ear and ear canal normal.  Left Ear: Hearing, tympanic membrane, external  ear and ear canal normal.  Nose: Nose normal.  Mouth/Throat: Uvula is midline and oropharynx is clear and moist.  Eyes: Conjunctivae and EOM are normal. Pupils are equal, round, and reactive to light.  Neck: Normal range of motion and full passive range of motion without pain. Neck supple. No JVD present. Carotid bruit is not present. No thyroid mass and no thyromegaly present.  Cardiovascular: Normal rate, normal heart sounds and intact distal pulses.   No murmur heard. Pulmonary/Chest: Effort normal and breath sounds normal. Right breast exhibits no inverted nipple, no mass, no nipple discharge, no skin change and no tenderness. Left breast exhibits no inverted nipple, no mass, no nipple discharge, no skin change and no tenderness.  Abdominal: Soft. Bowel sounds are normal. She exhibits no mass. There is no tenderness.  Genitourinary: Vagina normal and uterus normal. No breast swelling, tenderness, discharge or bleeding.  bimanual exam-No adnexal masses or tenderness. Vaginal cuff intact  Musculoskeletal: Normal range of motion.  Lymphadenopathy:    She has no cervical adenopathy.  Neurological: She is alert and oriented to person, place, and time.  Skin: Skin is warm and dry.  Psychiatric: She has a normal mood and affect. Her behavior is normal. Judgment and thought content normal.   BP 118/60 mmHg  Pulse 66  Temp(Src) 97 F (36.1 C) (Oral)  Ht 5' 2"  (1.575 m)  Wt 148 lb 6.4 oz (67.314 kg)  BMI 27.14 kg/m2         Assessment & Plan:  1. Pulmonary HTN Do not add slat to diet - CMP14+EGFR  2. Pure hypercholesterolemia Low fat diet - Lipid panel  3. Encounter for routine gynecological examination - POCT urinalysis dipstick -  POCT UA - Microscopic Only - Pap IG (Image Guided)  4. GAD -clonazepam 0.5 1/2 -1 po daily prn- side effects reviewed  Labs pending Health maintenance reviewed Diet and exercise encouraged Continue all meds Follow up  In 6 months    [place], FNP

## 2015-02-16 NOTE — Patient Instructions (Signed)
Fat and Cholesterol Control Diet Fat and cholesterol levels in your blood and organs are influenced by your diet. High levels of fat and cholesterol may lead to diseases of the heart, small and large blood vessels, gallbladder, liver, and pancreas. CONTROLLING FAT AND CHOLESTEROL WITH DIET Although exercise and lifestyle factors are important, your diet is key. That is because certain foods are known to raise cholesterol and others to lower it. The goal is to balance foods for their effect on cholesterol and more importantly, to replace saturated and trans fat with other types of fat, such as monounsaturated fat, polyunsaturated fat, and omega-3 fatty acids. On average, a person should consume no more than 15 to 17 g of saturated fat daily. Saturated and trans fats are considered "bad" fats, and they will raise LDL cholesterol. Saturated fats are primarily found in animal products such as meats, butter, and cream. However, that does not mean you need to give up all your favorite foods. Today, there are good tasting, low-fat, low-cholesterol substitutes for most of the things you like to eat. Choose low-fat or nonfat alternatives. Choose round or loin cuts of red meat. These types of cuts are lowest in fat and cholesterol. Chicken (without the skin), fish, veal, and ground turkey breast are great choices. Eliminate fatty meats, such as hot dogs and salami. Even shellfish have little or no saturated fat. Have a 3 oz (85 g) portion when you eat lean meat, poultry, or fish. Trans fats are also called "partially hydrogenated oils." They are oils that have been scientifically manipulated so that they are solid at room temperature resulting in a longer shelf life and improved taste and texture of foods in which they are added. Trans fats are found in stick margarine, some tub margarines, cookies, crackers, and baked goods.  When baking and cooking, oils are a great substitute for butter. The monounsaturated oils are  especially beneficial since it is believed they lower LDL and raise HDL. The oils you should avoid entirely are saturated tropical oils, such as coconut and palm.  Remember to eat a lot from food groups that are naturally free of saturated and trans fat, including fish, fruit, vegetables, beans, grains (barley, rice, couscous, bulgur wheat), and pasta (without cream sauces).  IDENTIFYING FOODS THAT LOWER FAT AND CHOLESTEROL  Soluble fiber may lower your cholesterol. This type of fiber is found in fruits such as apples, vegetables such as broccoli, potatoes, and carrots, legumes such as beans, peas, and lentils, and grains such as barley. Foods fortified with plant sterols (phytosterol) may also lower cholesterol. You should eat at least 2 g per day of these foods for a cholesterol lowering effect.  Read package labels to identify low-saturated fats, trans fat free, and low-fat foods at the supermarket. Select cheeses that have only 2 to 3 g saturated fat per ounce. Use a heart-healthy tub margarine that is free of trans fats or partially hydrogenated oil. When buying baked goods (cookies, crackers), avoid partially hydrogenated oils. Breads and muffins should be made from whole grains (whole-wheat or whole oat flour, instead of "flour" or "enriched flour"). Buy non-creamy canned soups with reduced salt and no added fats.  FOOD PREPARATION TECHNIQUES  Never deep-fry. If you must fry, either stir-fry, which uses very little fat, or use non-stick cooking sprays. When possible, broil, bake, or roast meats, and steam vegetables. Instead of putting butter or margarine on vegetables, use lemon and herbs, applesauce, and cinnamon (for squash and sweet potatoes). Use nonfat   yogurt, salsa, and low-fat dressings for salads.  LOW-SATURATED FAT / LOW-FAT FOOD SUBSTITUTES Meats / Saturated Fat (g)  Avoid: Steak, marbled (3 oz/85 g) / 11 g  Choose: Steak, lean (3 oz/85 g) / 4 g  Avoid: Hamburger (3 oz/85 g) / 7  g  Choose: Hamburger, lean (3 oz/85 g) / 5 g  Avoid: Ham (3 oz/85 g) / 6 g  Choose: Ham, lean cut (3 oz/85 g) / 2.4 g  Avoid: Chicken, with skin, dark meat (3 oz/85 g) / 4 g  Choose: Chicken, skin removed, dark meat (3 oz/85 g) / 2 g  Avoid: Chicken, with skin, light meat (3 oz/85 g) / 2.5 g  Choose: Chicken, skin removed, light meat (3 oz/85 g) / 1 g Dairy / Saturated Fat (g)  Avoid: Whole milk (1 cup) / 5 g  Choose: Low-fat milk, 2% (1 cup) / 3 g  Choose: Low-fat milk, 1% (1 cup) / 1.5 g  Choose: Skim milk (1 cup) / 0.3 g  Avoid: Hard cheese (1 oz/28 g) / 6 g  Choose: Skim milk cheese (1 oz/28 g) / 2 to 3 g  Avoid: Cottage cheese, 4% fat (1 cup) / 6.5 g  Choose: Low-fat cottage cheese, 1% fat (1 cup) / 1.5 g  Avoid: Ice cream (1 cup) / 9 g  Choose: Sherbet (1 cup) / 2.5 g  Choose: Nonfat frozen yogurt (1 cup) / 0.3 g  Choose: Frozen fruit bar / trace  Avoid: Whipped cream (1 tbs) / 3.5 g  Choose: Nondairy whipped topping (1 tbs) / 1 g Condiments / Saturated Fat (g)  Avoid: Mayonnaise (1 tbs) / 2 g  Choose: Low-fat mayonnaise (1 tbs) / 1 g  Avoid: Butter (1 tbs) / 7 g  Choose: Extra light margarine (1 tbs) / 1 g  Avoid: Coconut oil (1 tbs) / 11.8 g  Choose: Olive oil (1 tbs) / 1.8 g  Choose: Corn oil (1 tbs) / 1.7 g  Choose: Safflower oil (1 tbs) / 1.2 g  Choose: Sunflower oil (1 tbs) / 1.4 g  Choose: Soybean oil (1 tbs) / 2.4 g  Choose: Canola oil (1 tbs) / 1 g Document Released: 08/05/2005 Document Revised: 11/30/2012 Document Reviewed: 11/03/2013 [employer] Patient Information 2015 [employer]. This information is not intended to replace advice given to you by your health care provider. Make sure you discuss any questions you have with your health care provider.  

## 2015-02-17 LAB — CMP14+EGFR
ALT: 26 IU/L (ref 0–32)
AST: 30 IU/L (ref 0–40)
Albumin/Globulin Ratio: 2 (ref 1.1–2.5)
Albumin: 4.6 g/dL (ref 3.5–4.8)
Alkaline Phosphatase: 67 IU/L (ref 39–117)
BUN/Creatinine Ratio: 19 (ref 11–26)
BUN: 18 mg/dL (ref 8–27)
Bilirubin Total: 1.3 mg/dL — ABNORMAL HIGH (ref 0.0–1.2)
CO2: 25 mmol/L (ref 18–29)
CREATININE: 0.95 mg/dL (ref 0.57–1.00)
Calcium: 10.1 mg/dL (ref 8.7–10.3)
Chloride: 98 mmol/L (ref 97–108)
GFR calc non Af Amer: 58 mL/min/{1.73_m2} — ABNORMAL LOW (ref >59)
GFR, EST AFRICAN AMERICAN: 67 mL/min/{1.73_m2} (ref >59)
GLOBULIN, TOTAL: 2.3 g/dL (ref 1.5–4.5)
Glucose: 80 mg/dL (ref 65–99)
POTASSIUM: 4.3 mmol/L (ref 3.5–5.2)
Sodium: 140 mmol/L (ref 134–144)
Total Protein: 6.9 g/dL (ref 6.0–8.5)

## 2015-02-17 LAB — LIPID PANEL
CHOL/HDL RATIO: 4.5 ratio — AB (ref 0.0–4.4)
CHOLESTEROL TOTAL: 236 mg/dL — AB (ref 100–199)
HDL: 53 mg/dL (ref >39)
LDL Calculated: 159 mg/dL — ABNORMAL HIGH (ref 0–99)
Triglycerides: 120 mg/dL (ref 0–149)
VLDL Cholesterol Cal: 24 mg/dL (ref 5–40)

## 2015-02-17 LAB — URINE CULTURE

## 2015-02-19 LAB — PAP IG (IMAGE GUIDED): PAP Smear Comment: 0

## 2015-03-17 ENCOUNTER — Other Ambulatory Visit: Payer: Self-pay | Admitting: Internal Medicine

## 2015-04-06 DIAGNOSIS — Z1231 Encounter for screening mammogram for malignant neoplasm of breast: Secondary | ICD-10-CM | POA: Diagnosis not present

## 2015-04-13 ENCOUNTER — Other Ambulatory Visit: Payer: Self-pay | Admitting: Internal Medicine

## 2015-05-01 ENCOUNTER — Other Ambulatory Visit: Payer: Self-pay | Admitting: Family Medicine

## 2015-05-01 ENCOUNTER — Encounter: Payer: Self-pay | Admitting: Family Medicine

## 2015-05-05 ENCOUNTER — Other Ambulatory Visit: Payer: Self-pay

## 2015-05-05 MED ORDER — FUROSEMIDE 20 MG PO TABS: ORAL_TABLET | ORAL | Status: DC

## 2015-05-05 NOTE — Telephone Encounter (Signed)
Beatrice Lecher, PA-C at 07/27/2014 8:53 AM  furosemide (LASIX) 20 MG tabletTake 2 tablets on Mon, Wed, Fri. Other days take one tablet. May take extra tablet as needed for weight gain/swelling ASSESSMENT AND PLAN:  1. Shortness of breath: Improved on higher dose of Lasix. Recent echo with normal LVF. Continue current Rx.

## 2015-05-11 ENCOUNTER — Other Ambulatory Visit: Payer: Self-pay

## 2015-05-11 ENCOUNTER — Ambulatory Visit: Payer: Self-pay | Admitting: Internal Medicine

## 2015-05-11 MED ORDER — FUROSEMIDE 20 MG PO TABS: ORAL_TABLET | ORAL | Status: DC

## 2015-05-11 NOTE — Telephone Encounter (Signed)
Call Documentation      Joanette Gula, CMA at 05/05/2015 3:19 PM     Status: Signed       Expand All Collapse All   Beatrice Lecher, PA-C at 07/27/2014 8:53 AM  furosemide (LASIX) 20 MG tabletTake 2 tablets on Mon, Wed, Fri. Other days take one tablet. May take extra tablet as needed for weight gain/swelling ASSESSMENT AND PLAN:  1. Shortness of breath: Improved on higher dose of Lasix. Recent echo with normal LVF. Continue current Rx.

## 2015-05-17 ENCOUNTER — Other Ambulatory Visit: Payer: Self-pay | Admitting: Internal Medicine

## 2015-05-25 ENCOUNTER — Ambulatory Visit (INDEPENDENT_AMBULATORY_CARE_PROVIDER_SITE_OTHER): Payer: Medicare Other

## 2015-05-25 DIAGNOSIS — Z23 Encounter for immunization: Secondary | ICD-10-CM

## 2015-05-25 NOTE — Progress Notes (Signed)
pneunovax 23 and fluarix given pt tolerated well

## 2015-07-14 ENCOUNTER — Ambulatory Visit: Payer: Self-pay | Admitting: Internal Medicine

## 2015-07-17 ENCOUNTER — Ambulatory Visit: Payer: Self-pay | Admitting: Internal Medicine

## 2015-07-31 ENCOUNTER — Ambulatory Visit (INDEPENDENT_AMBULATORY_CARE_PROVIDER_SITE_OTHER): Payer: Medicare Other | Admitting: Family

## 2015-07-31 ENCOUNTER — Encounter: Payer: Self-pay | Admitting: Family

## 2015-07-31 VITALS — BP 121/61 | HR 82 | Temp 98.0°F | Ht 62.0 in | Wt 145.0 lb

## 2015-07-31 DIAGNOSIS — J309 Allergic rhinitis, unspecified: Secondary | ICD-10-CM

## 2015-07-31 MED ORDER — CETIRIZINE HCL 10 MG PO TABS: 10.0000 mg | ORAL_TABLET | Freq: Every day | ORAL | Status: DC

## 2015-07-31 MED ORDER — FLUTICASONE PROPIONATE 50 MCG/ACT NA SUSP: 2.0000 | Freq: Every day | NASAL | Status: DC

## 2015-07-31 NOTE — Progress Notes (Signed)
   Subjective:    Patient ID: Kelly [name], female    DOB: 1941-07-24, 74 y.o.   MRN: [id number]  Sinus Problem This is a new problem. The current episode started yesterday. The problem is unchanged. There has been no fever. Her pain is at a severity of 5/10. The pain is moderate. Associated symptoms include coughing, sinus pressure and sneezing. Pertinent negatives include no congestion, ear pain, headaches, shortness of breath, sore throat or swollen glands. Past treatments include oral decongestants. The treatment provided mild relief.      Review of Systems  Constitutional: Negative.   HENT: Positive for sinus pressure and sneezing. Negative for congestion, ear pain and sore throat.   Eyes: Negative.   Respiratory: Positive for cough. Negative for shortness of breath.   Cardiovascular: Negative.  Negative for palpitations.  Gastrointestinal: Negative.   Endocrine: Negative.   Genitourinary: Negative.   Musculoskeletal: Negative.   Neurological: Negative.  Negative for headaches.  Hematological: Negative.   Psychiatric/Behavioral: Negative.   All other systems reviewed and are negative.      Objective:   Physical Exam  Constitutional: She is oriented to person, place, and time. She appears well-developed and well-nourished. No distress.  HENT:  Head: Normocephalic and atraumatic.  Right Ear: External ear normal.  Nasal passage erythemas with mild swelling  Oropharynx erythemas   Eyes: Pupils are equal, round, and reactive to light.  Neck: Normal range of motion. Neck supple. No thyromegaly present.  Cardiovascular: Normal rate, regular rhythm, normal heart sounds and intact distal pulses.   No murmur heard. Pulmonary/Chest: Effort normal and breath sounds normal. No respiratory distress. She has no wheezes.  Abdominal: Soft. Bowel sounds are normal. She exhibits no distension. There is no tenderness.  Musculoskeletal: Normal range of motion. She exhibits no edema or  tenderness.  Neurological: She is alert and oriented to person, place, and time. She has normal reflexes. No cranial nerve deficit.  Skin: Skin is warm and dry.  Psychiatric: She has a normal mood and affect. Her behavior is normal. Judgment and thought content normal.  Vitals reviewed.     BP 121/61 mmHg  Pulse 82  Temp(Src) 98 F (36.7 C) (Oral)  Ht 5\' 2"  (1.575 m)  Wt 145 lb (65.772 kg)  BMI 26.51 kg/m2     Assessment & Plan:  1. Allergic rhinitis, unspecified allergic rhinitis type -- Take meds as prescribed - Use a cool mist humidifier  -Use saline nose sprays frequently -Saline irrigations of the nose can be very helpful if done frequently.  * 4X daily for 1 week*  * Use of a nettie pot can be helpful with this. Follow directions with this* -Force fluids -For any cough or congestion  Use plain Mucinex- regular strength or max strength is fine   * Children- consult with Pharmacist for dosing -For fever or aces or pains- take tylenol or ibuprofen appropriate for age and weight.  * for fevers greater than 101 orally you may alternate ibuprofen and tylenol every  3 hours. -Throat lozenges if help - fluticasone (FLONASE) 50 MCG/ACT nasal spray; Place 2 sprays into both nostrils daily.  Dispense: 16 g; Refill: 6  Jannifer Rodney, FNP

## 2015-07-31 NOTE — Patient Instructions (Signed)
Allergic Rhinitis Allergic rhinitis is when the mucous membranes in the nose respond to allergens. Allergens are particles in the air that cause your body to have an allergic reaction. This causes you to release allergic antibodies. Through a chain of events, these eventually cause you to release histamine into the blood stream. Although meant to protect the body, it is this release of histamine that causes your discomfort, such as frequent sneezing, congestion, and an itchy, runny nose.  CAUSES Seasonal allergic rhinitis (hay fever) is caused by pollen allergens that may come from grasses, trees, and weeds. Year-round allergic rhinitis (perennial allergic rhinitis) is caused by allergens such as house dust mites, pet dander, and mold spores. SYMPTOMS  Nasal stuffiness (congestion).  Itchy, runny nose with sneezing and tearing of the eyes. DIAGNOSIS Your health care provider can help you determine the allergen or allergens that trigger your symptoms. If you and your health care provider are unable to determine the allergen, skin or blood testing may be used. Your health care provider will diagnose your condition after taking your health history and performing a physical exam. Your health care provider may assess you for other related conditions, such as asthma, pink eye, or an ear infection. TREATMENT Allergic rhinitis does not have a cure, but it can be controlled by:  Medicines that block allergy symptoms. These may include allergy shots, nasal sprays, and oral antihistamines.  Avoiding the allergen. Hay fever may often be treated with antihistamines in pill or nasal spray forms. Antihistamines block the effects of histamine. There are over-the-counter medicines that may help with nasal congestion and swelling around the eyes. Check with your health care provider before taking or giving this medicine. If avoiding the allergen or the medicine prescribed do not work, there are many new medicines  your health care provider can prescribe. Stronger medicine may be used if initial measures are ineffective. Desensitizing injections can be used if medicine and avoidance does not work. Desensitization is when a patient is given ongoing shots until the body becomes less sensitive to the allergen. Make sure you follow up with your health care provider if problems continue. HOME CARE INSTRUCTIONS It is not possible to completely avoid allergens, but you can reduce your symptoms by taking steps to limit your exposure to them. It helps to know exactly what you are allergic to so that you can avoid your specific triggers. SEEK MEDICAL CARE IF:  You have a fever.  You develop a cough that does not stop easily (persistent).  You have shortness of breath.  You start wheezing.  Symptoms interfere with normal daily activities.   This information is not intended to replace advice given to you by your health care provider. Make sure you discuss any questions you have with your health care provider.   Document Released: 04/30/2001 Document Revised: 08/26/2014 Document Reviewed: 04/12/2013 Elsevier Interactive Patient Education 2016 [employer]  - Take meds as prescribed - Use a cool mist humidifier  -Use saline nose sprays frequently -Saline irrigations of the nose can be very helpful if done frequently.  * 4X daily for 1 week*  * Use of a nettie pot can be helpful with this. Follow directions with this* -Force fluids -For any cough or congestion  Use plain Mucinex- regular strength or max strength is fine   * Children- consult with Pharmacist for dosing -For fever or aces or pains- take tylenol or ibuprofen appropriate for age and weight.  * for fevers greater than 101 orally   you may alternate ibuprofen and tylenol every  3 hours. -Throat lozenges if help   Christy Hawks, FNP  

## 2015-08-24 ENCOUNTER — Ambulatory Visit (INDEPENDENT_AMBULATORY_CARE_PROVIDER_SITE_OTHER): Payer: Medicare Other | Admitting: Physician Assistant

## 2015-08-24 ENCOUNTER — Encounter: Payer: Self-pay | Admitting: Physician Assistant

## 2015-08-24 VITALS — BP 140/70 | HR 69 | Ht 62.0 in | Wt 148.2 lb

## 2015-08-24 DIAGNOSIS — I429 Cardiomyopathy, unspecified: Secondary | ICD-10-CM

## 2015-08-24 DIAGNOSIS — I10 Essential (primary) hypertension: Secondary | ICD-10-CM | POA: Diagnosis not present

## 2015-08-24 DIAGNOSIS — I428 Other cardiomyopathies: Secondary | ICD-10-CM

## 2015-08-24 DIAGNOSIS — E7800 Pure hypercholesterolemia, unspecified: Secondary | ICD-10-CM | POA: Diagnosis not present

## 2015-08-24 MED ORDER — VALSARTAN 80 MG PO TABS: ORAL_TABLET | ORAL | Status: DC

## 2015-08-24 MED ORDER — FUROSEMIDE 40 MG PO TABS: 40.0000 mg | ORAL_TABLET | Freq: Every day | ORAL | Status: DC

## 2015-08-24 MED ORDER — FUROSEMIDE 20 MG PO TABS: ORAL_TABLET | ORAL | Status: DC

## 2015-08-24 NOTE — Progress Notes (Signed)
Cardiology Office Note   Date:  08/24/2015   ID:  Kelly [name], DOB Feb 21, 1941, MRN [id number]  PCP:  Mechele Claude, MD  Cardiologist: Dr. Tenny Craw  Chief Complaint  Patient presents with  . Other    add on bc pt is here now   She missed her yearly appointment due to husband being sick. Confused and showed up at office today. She lives over an hour away so we added her to my schedule today.    History of Present Illness:  Kelly [name] is a 75 y.o. female with a hx of NICM with mild LV dysfunction and HTN who presents for yearly follow up.   EF had returned to normal by last 2D ECHO 06/2014. She has a history of normal coronary arteries by catheterization in 2002. Echocardiogram in 2013 demonstrated normal LV function with mild mitral valve prolapse. Myoview in 2013 demonstrated normal perfusion. Last seen by Dr. Dietrich Pates 01/2014.   Seen by Tereso Newcomer PA-C 07/2014 and felt to be doing well from a cardiac standpoint.    Today she presents to clinic for follow up. No CP or SOB. She is very active with her work and no exertional symptoms. No LE edema, orthopnea or PND. No dizziness or passing. No blood in stool or urine. She is doing quite well with no complaints.    Studies: - Echo (11/15): EF 50-55%, normal diastolic function, mild MR    Past Medical History  Diagnosis Date  . HLD (hyperlipidemia)   . Migraine   . HTN (hypertension)   . NICM (nonischemic cardiomyopathy) (HCC)     a. LHC 8/02: EF 55%, 2+ MR, normal cors;  b.  Echo 3/10: EF 50-55%, mild diast dysfn, mild MVP (ant leaflet), mild MR, mild TR, small pericard eff.;   c.  Myoview 3/10:  EF 69%, no ischemia;  d. Echo 9/13: EF 50-55%, Gr 1 diast dysfn, mild prolapse of ant MV leaflet;   e. Myoview 9/13: EF 64%, NL perfusion;  f.  Echo (11/15):  EF 50-55%, normal diastolic function, mild MR  . Pulmonary HTN (HCC)   . Depression with anxiety     Past Surgical History  Procedure Laterality Date  .  Abdominal hysterectomy    . Cholecystectomy    . Appendectomy    . Eye surgery       Current Outpatient Prescriptions  Medication Sig Dispense Refill  . aspirin 81 MG tablet Take 81 mg by mouth daily.    . celecoxib (CELEBREX) 200 MG capsule Take 200 mg by mouth daily as needed for mild pain or moderate pain.    . cetirizine (ZYRTEC) 10 MG tablet Take 10 mg by mouth daily as needed for allergies.    . cholecalciferol (VITAMIN D) 1000 UNITS tablet Take 1,000 Units by mouth daily.    . clonazePAM (KLONOPIN) 0.5 MG tablet Take 0.5 mg by mouth daily as needed for anxiety.    . fluticasone (FLONASE) 50 MCG/ACT nasal spray Place 2 sprays into both nostrils daily. 16 g 6  . meclizine (ANTIVERT) 12.5 MG tablet Take 12.5 mg by mouth 2 (two) times daily as needed for dizziness.    Marland Kitchen omeprazole (PRILOSEC) 40 MG capsule TAKE ONE CAPSULE BY MOUTH AS NEEDED    . furosemide (LASIX) 40 MG tablet Take 1 tablet (40 mg total) by mouth daily. 90 tablet 3  . valsartan (DIOVAN) 80 MG tablet TAKE ONE TABLET BY MOUTH ONCE DAILY (NEEDS TO SCHEDULE APPOINTMENT)  90 tablet 3   No current facility-administered medications for this visit.    Allergies:   Codeine and Simvastatin    Social History:  The patient  reports that she has never smoked. She has never used smokeless tobacco. She reports that she does not drink alcohol or use illicit drugs.   Family History:  The patient'sfamily history includes Asthma in her mother; COPD in her mother; Cancer in her sister; Heart attack in her brother; Heart disease in her brother and mother; Thrombosis in her father. There is no history of Stroke.    ROS:  Please see the history of present illness.   Otherwise, review of systems are positive for none.   All other systems are reviewed and negative.    PHYSICAL EXAM: VS:  BP 140/70 mmHg  Pulse 69  Ht  (1.575 m)  Wt 148 lb 3.2 oz (67.223 kg)  BMI 27.10 kg/m2 , BMI Body mass index is 27.1 kg/(m^2). GEN: Well  nourished, well developed, in no acute distress HEENT: normal Neck: no JVD, carotid bruits, or masses Cardiac: RRR; no murmurs, rubs, or gallops,no edema  Respiratory:  clear to auscultation bilaterally, normal work of breathing GI: soft, nontender, nondistended, + BS MS: no deformity or atrophy Skin: warm and dry, no rash Neuro:  Strength and sensation are intact Psych: euthymic mood, full affect   EKG:  EKG is ordered today. The ekg ordered today demonstrates sinus rhythn, low voltage QRS. Possible old septal infarct.   Recent Labs: 01/12/2015: Hemoglobin 13.6 02/16/2015: ALT 26; BUN 18; Creatinine, Ser 0.95; Potassium 4.3; Sodium 140    Lipid Panel    Component Value Date/Time   CHOL 236* 02/16/2015 1209   CHOL 119 10/22/2010 0829   TRIG 120 02/16/2015 1209   HDL 53 02/16/2015 1209   HDL 46.80 10/22/2010 0829   CHOLHDL 4.5* 02/16/2015 1209   CHOLHDL 3 10/22/2010 0829   VLDL 14.6 10/22/2010 0829   LDLCALC 159* 02/16/2015 1209   LDLCALC 58 10/22/2010 0829   LDLDIRECT 151.3 08/16/2010 1118      Wt Readings from Last 3 Encounters:  08/24/15 148 lb 3.2 oz (67.223 kg)  07/31/15 145 lb (65.772 kg)  02/16/15 148 lb 6.4 oz (67.314 kg)      Other studies Reviewed: Additional studies/ records that were reviewed today include: 2D ECHO Review of the above records demonstrates: see HPI   ASSESSMENT AND PLAN:  Kelly [name] is a 75 y.o. female with a hx of NICM with mild LV dysfunction and HTN who presents for yearly follow up.   NICM--> EF normalized: EF remains normal. Continue ARB. She was on Lasix  daily except for M, W, Fri she takes  BID. She would like to take lasix 40 mg daily everyday for convenience. She will continue to take an extra Lasix PRN for increased fluid or weight gain.   Essential hypertension: Controlled. Continue Diovan  po daily.   Current medicines are reviewed at length with the patient today.  The patient has concerns regarding  medicine  The following changes have been made:  Change to lasix  daily with PRN lasix for extra fluid or weight gain   Disposition:   FU with Dr. Tenny Craw 6 months  Signed, Janetta Hora, PA-C  08/24/2015 9:42 AM    [facility] [address] [place], [place], [place]  [address] Phone: [phone]; Fax: [phone]

## 2015-08-24 NOTE — Patient Instructions (Addendum)
Medication Instructions:  Your physician recommends that you continue on your current medications as directed. Please refer to the Current Medication list given to you today.   Labwork: None ordered  Testing/Procedures: None ordered  Follow-Up: Your physician wants you to follow-up in: 6 MONTHS WITH DR. ROSS  You will receive a reminder letter in the mail two months in advance. If you don't receive a letter, please call our office to schedule the follow-up appointment.   Any Other Special Instructions Will Be Listed Below (If Applicable).     If you need a refill on your cardiac medications before your next appointment, please call your pharmacy.   

## 2015-09-07 ENCOUNTER — Other Ambulatory Visit: Payer: Medicare Other

## 2015-09-07 DIAGNOSIS — Z1212 Encounter for screening for malignant neoplasm of rectum: Secondary | ICD-10-CM | POA: Diagnosis not present

## 2015-09-07 NOTE — Progress Notes (Signed)
Lab only 

## 2015-09-08 ENCOUNTER — Ambulatory Visit: Payer: Medicare Other | Admitting: Family Medicine

## 2015-09-11 LAB — FECAL OCCULT BLOOD, IMMUNOCHEMICAL: Fecal Occult Bld: NEGATIVE

## 2015-09-18 ENCOUNTER — Ambulatory Visit: Payer: Self-pay | Admitting: Internal Medicine

## 2015-09-22 DIAGNOSIS — H43393 Other vitreous opacities, bilateral: Secondary | ICD-10-CM | POA: Diagnosis not present

## 2015-09-22 DIAGNOSIS — H40033 Anatomical narrow angle, bilateral: Secondary | ICD-10-CM | POA: Diagnosis not present

## 2015-10-02 DIAGNOSIS — H43393 Other vitreous opacities, bilateral: Secondary | ICD-10-CM | POA: Diagnosis not present

## 2015-10-18 ENCOUNTER — Other Ambulatory Visit: Payer: Self-pay | Admitting: Nurse Practitioner

## 2015-10-18 NOTE — Telephone Encounter (Signed)
Last seen 07/31/15  Christy  Dr Darlyn Read  PCP  If approved route to nurse to call into [facility]

## 2015-10-20 NOTE — Telephone Encounter (Signed)
RX for Clonazepam called into [facility] per MMM

## 2015-10-20 NOTE — Telephone Encounter (Signed)
Please call in klonopin with 1 refills 

## 2015-10-20 NOTE — Telephone Encounter (Signed)
Please review and advise.

## 2015-12-20 ENCOUNTER — Ambulatory Visit (INDEPENDENT_AMBULATORY_CARE_PROVIDER_SITE_OTHER): Payer: Medicare Other | Admitting: Family Medicine

## 2015-12-20 ENCOUNTER — Encounter: Payer: Self-pay | Admitting: Family Medicine

## 2015-12-20 VITALS — BP 120/54 | HR 67 | Temp 97.5°F | Ht 62.0 in | Wt 150.0 lb

## 2015-12-20 DIAGNOSIS — S80921A Unspecified superficial injury of right lower leg, initial encounter: Secondary | ICD-10-CM | POA: Diagnosis not present

## 2015-12-20 DIAGNOSIS — S80922A Unspecified superficial injury of left lower leg, initial encounter: Secondary | ICD-10-CM

## 2015-12-20 DIAGNOSIS — W57XXXA Bitten or stung by nonvenomous insect and other nonvenomous arthropods, initial encounter: Secondary | ICD-10-CM

## 2015-12-20 MED ORDER — DOXYCYCLINE HYCLATE 100 MG PO CAPS: 100.0000 mg | ORAL_CAPSULE | Freq: Two times a day (BID) | ORAL | Status: DC

## 2015-12-20 NOTE — Progress Notes (Signed)
Subjective:  Patient ID: Kelly [name], female    DOB: January 27, 1941  Age: 75 y.o. MRN: [id number]  CC: Tick Removal   HPI Kelly [name] presents for several ticks removed from her legs by her daughter over the last few days. Apparently they have gotten in her yard. Neighbor has them too. He has  A dog. No fever. Local redness treeated with neosporin.   History Kelly [name] has a past medical history of HLD (hyperlipidemia); Migraine; HTN (hypertension); NICM (nonischemic cardiomyopathy) (HCC); Pulmonary HTN (HCC); and Depression with anxiety.   She has past surgical history that includes Abdominal hysterectomy; Cholecystectomy; Appendectomy; and Eye surgery.   Her family history includes Asthma in her mother; COPD in her mother; Cancer in her sister; Heart attack in her brother; Heart disease in her brother and mother; Thrombosis in her father. There is no history of Stroke.She reports that she has never smoked. She has never used smokeless tobacco. She reports that she does not drink alcohol or use illicit drugs.    ROS Review of Systems  Constitutional: Negative for fever, chills, diaphoresis, appetite change and fatigue.  HENT: Negative for congestion, ear pain, hearing loss, postnasal drip, rhinorrhea, sore throat and trouble swallowing.   Respiratory: Negative for cough, chest tightness and shortness of breath.   Cardiovascular: Negative for chest pain and palpitations.  Musculoskeletal: Negative for arthralgias.  Skin: Negative for rash.    Objective:  BP 120/54 mmHg  Pulse 67  Temp(Src) 97.5 F (36.4 C) (Oral)  Ht  (1.575 m)  Wt 150 lb (68.04 kg)  BMI 27.43 kg/m2  BP Readings from Last 3 Encounters:  12/20/15 120/54  08/24/15 140/70  07/31/15 121/61    Wt Readings from Last 3 Encounters:  12/20/15 150 lb (68.04 kg)  08/24/15 148 lb 3.2 oz (67.223 kg)  07/31/15 145 lb (65.772 kg)     Physical Exam  Constitutional: She appears well-developed and  well-nourished. No distress.  Cardiovascular: Normal rate and regular rhythm.   Pulmonary/Chest: Effort normal and breath sounds normal.  Skin:  Scattered erythematous papules on legs numbering 7. Each is erythematous. Measures about 4 mm and has a central crust.      Lab Results  Component Value Date   WBC 5.6 01/12/2015   HGB 13.6 01/12/2015   HCT 44.2 01/12/2015   PLT 205.0 06/15/2014   GLUCOSE 80 02/16/2015   CHOL 236* 02/16/2015   TRIG 120 02/16/2015   HDL 53 02/16/2015   LDLDIRECT 151.3 08/16/2010   LDLCALC 159* 02/16/2015   ALT 26 02/16/2015   AST 30 02/16/2015   NA 140 02/16/2015   K 4.3 02/16/2015   CL 98 02/16/2015   CREATININE 0.95 02/16/2015   BUN 18 02/16/2015   CO2 25 02/16/2015   TSH 0.62 06/15/2014    Dg Chest 2 View  04/23/2012  *RADIOLOGY REPORT* Clinical Data: COPD and chest pain CHEST - 2 VIEW Comparison: 06/03/06 Findings: Heart size appears mildly enlarged.  No pleural effusion or edema.  There are coarsened interstitial markings noted bilaterally.  The lungs appear hyperinflated.  No airspace consolidation noted. IMPRESSION: 1.  No acute cardiopulmonary abnormalities. Original Report Authenticated By: Rosealee Albee, M.D.    Assessment & Plan:   Kelly [name] was seen today for tick removal.  Diagnoses and all orders for this visit:  Tick bite  Other orders -     doxycycline (VIBRAMYCIN) 100 MG capsule; Take 1 capsule (100 mg total) by mouth 2 (two) times daily.  I have discontinued Ms. Brigante's omeprazole, fluticasone, and celecoxib. I am also having her start on doxycycline. Additionally, I am having her maintain her aspirin, cholecalciferol, cetirizine, clonazePAM, meclizine, valsartan, and furosemide.  Meds ordered this encounter  Medications  . doxycycline (VIBRAMYCIN) 100 MG capsule    Sig: Take 1 capsule (100 mg total) by mouth 2 (two) times daily.    Dispense:  20 capsule    Refill:  0     Follow-up: Return if symptoms worsen  or fail to improve.  Mechele Claude, M.D.

## 2015-12-28 ENCOUNTER — Telehealth: Payer: Self-pay | Admitting: Family Medicine

## 2015-12-28 NOTE — Telephone Encounter (Signed)
Patient is requesting labs for the tick bites. She states that the bites are not improving and they are sore. She is still taking the doxycycline as prescribed. Please advise

## 2015-12-29 ENCOUNTER — Encounter (INDEPENDENT_AMBULATORY_CARE_PROVIDER_SITE_OTHER): Payer: Self-pay

## 2015-12-29 ENCOUNTER — Encounter: Payer: Self-pay | Admitting: Family Medicine

## 2015-12-29 ENCOUNTER — Ambulatory Visit (INDEPENDENT_AMBULATORY_CARE_PROVIDER_SITE_OTHER): Payer: Medicare Other | Admitting: Family Medicine

## 2015-12-29 VITALS — BP 127/67 | HR 78 | Temp 97.7°F | Ht 62.0 in | Wt 151.0 lb

## 2015-12-29 DIAGNOSIS — W57XXXA Bitten or stung by nonvenomous insect and other nonvenomous arthropods, initial encounter: Secondary | ICD-10-CM | POA: Diagnosis not present

## 2015-12-29 DIAGNOSIS — S90912A Unspecified superficial injury of left ankle, initial encounter: Secondary | ICD-10-CM | POA: Diagnosis not present

## 2015-12-29 DIAGNOSIS — S90911A Unspecified superficial injury of right ankle, initial encounter: Secondary | ICD-10-CM | POA: Diagnosis not present

## 2015-12-29 NOTE — Progress Notes (Signed)
BP 127/67 mmHg  Pulse 78  Temp(Src) 97.7 F (36.5 C) (Oral)  Ht  (1.575 m)  Wt 151 lb (68.493 kg)  BMI 27.61 kg/m2   Subjective:    Patient ID: Kelly [name], female    DOB: 08-04-1941, 75 y.o.   MRN: [id number]  HPI: Kelly [name] is a 75 y.o. female presenting on 12/29/2015 for Followup tick bites   HPI Follow-up tick bites Patient was coming in today for a follow-up on her tick bites. She was concerned because the eschars that she had her just not healing as quickly as they should has been going on for couple weeks now. She is still taking doxycycline which she got about a week ago and has a few more days left of it. The spots that she has are just went eschars are there is no surrounding erythema or any other rashes that have developed. There is no tenderness to them sometimes they can be a little bit pruritic  Relevant past medical, surgical, family and social history reviewed and updated as indicated. Interim medical history since our last visit reviewed. Allergies and medications reviewed and updated.  Review of Systems  Constitutional: Negative for fever and chills.  HENT: Negative for congestion, ear discharge and ear pain.   Eyes: Negative for redness and visual disturbance.  Respiratory: Negative for chest tightness and shortness of breath.   Cardiovascular: Negative for chest pain and leg swelling.  Genitourinary: Negative for dysuria and difficulty urinating.  Musculoskeletal: Negative for back pain and gait problem.  Skin: Positive for rash. Negative for color change.  Neurological: Negative for light-headedness and headaches.  Psychiatric/Behavioral: Negative for behavioral problems and agitation.  All other systems reviewed and are negative.   Per HPI unless specifically indicated above     Medication List       This list is accurate as of: 12/29/15 12:05 PM.  Always use your most recent med list.               aspirin 81 MG tablet  Take 81  mg by mouth daily.     cetirizine 10 MG tablet  Commonly known as:  ZYRTEC  Take 10 mg by mouth daily as needed for allergies.     cholecalciferol 1000 units tablet  Commonly known as:  VITAMIN D  Take 1,000 Units by mouth daily.     clonazePAM 0.5 MG tablet  Commonly known as:  KLONOPIN  Take 0.5 mg by mouth daily as needed for anxiety.     doxycycline 100 MG capsule  Commonly known as:  VIBRAMYCIN  Take 1 capsule (100 mg total) by mouth 2 (two) times daily.     furosemide 40 MG tablet  Commonly known as:  LASIX  Take 1 tablet (40 mg total) by mouth daily.     meclizine 12.5 MG tablet  Commonly known as:  ANTIVERT  Take 12.5 mg by mouth 2 (two) times daily as needed for dizziness.     valsartan 80 MG tablet  Commonly known as:  DIOVAN  TAKE ONE TABLET BY MOUTH ONCE DAILY (NEEDS TO SCHEDULE APPOINTMENT)           Objective:    BP 127/67 mmHg  Pulse 78  Temp(Src) 97.7 F (36.5 C) (Oral)  Ht  (1.575 m)  Wt 151 lb (68.493 kg)  BMI 27.61 kg/m2  Wt Readings from Last 3 Encounters:  12/29/15 151 lb (68.493 kg)  12/20/15 150 lb (68.04 kg)  08/24/15 148 lb 3.2 oz (67.223 kg)    Physical Exam  Constitutional: She is oriented to person, place, and time. She appears well-developed and well-nourished. No distress.  Eyes: Conjunctivae and EOM are normal. Pupils are equal, round, and reactive to light.  Cardiovascular: Normal rate, regular rhythm, normal heart sounds and intact distal pulses.   No murmur heard. Pulmonary/Chest: Effort normal and breath sounds normal. No respiratory distress. She has no wheezes.  Musculoskeletal: Normal range of motion. She exhibits no edema or tenderness.  Neurological: She is alert and oriented to person, place, and time. Coordination normal.  Skin: Skin is warm and dry. Rash (A total of about 10 eschars in bilateral lower extremities, no surrounding erythema or other rashes noted. No tenderness or drainage noted either.) noted. She is  not diaphoretic.  Psychiatric: She has a normal mood and affect. Her behavior is normal.  Nursing note and vitals reviewed.   Results for orders placed or performed in visit on 09/07/15  Fecal occult blood, imunochemical  Result Value Ref Range   Fecal Occult Bld Negative Negative      Assessment & Plan:   Problem List Items Addressed This Visit    None    Visit Diagnoses    Tick bite    -  Primary    Reassured her, eschars are just not healing as quickly as she would like, likely because ticks leave enzymes that delay healing        Follow up plan: Return if symptoms worsen or fail to improve.  Counseling provided for all of the vaccine components No orders of the defined types were placed in this encounter.    Arville Care, MD [facility] 12/29/2015, 12:05 PM

## 2015-12-29 NOTE — Telephone Encounter (Signed)
Appt made to see Dr. Louanne Skye at 10:25 on 5/12

## 2015-12-29 NOTE — Telephone Encounter (Signed)
In order to consider this patient request, the patient will need to be seen 

## 2016-02-22 ENCOUNTER — Ambulatory Visit (INDEPENDENT_AMBULATORY_CARE_PROVIDER_SITE_OTHER): Payer: Medicare Other | Admitting: Family Medicine

## 2016-02-22 ENCOUNTER — Encounter: Payer: Self-pay | Admitting: Family Medicine

## 2016-02-22 VITALS — BP 109/57 | HR 71 | Temp 97.0°F | Ht 62.0 in | Wt 152.4 lb

## 2016-02-22 DIAGNOSIS — G44209 Tension-type headache, unspecified, not intractable: Secondary | ICD-10-CM

## 2016-02-22 MED ORDER — BACLOFEN 10 MG PO TABS: 10.0000 mg | ORAL_TABLET | Freq: Three times a day (TID) | ORAL | Status: DC

## 2016-02-22 NOTE — Progress Notes (Signed)
   HPI  Patient presents today here with headache.  Headache She's had persistent headaches every morning for the last 2-3 months. She states that headaches are in the Like distribution to start right after she wakes up in the last 2-4 hours. Improved with ice and massage of the head. She denies any vision changes, numbness, tingling, or other concerns.  She does have vertigo and occasionally has "drunk feelings" consistent with vertigo.  These headaches do not cause nausea and are not worsened by light.  She uses Tylenol or ibuprofen occasionally.  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  PMH: Smoking status noted ROS: Per HPI  Objective: BP 109/57 mmHg  Pulse 71  Temp(Src) 97 F (36.1 C) (Oral)  Ht 5\' 2"  (1.575 m)  Wt 152 lb 6.4 oz (69.128 kg)  BMI 27.87 kg/m2 Gen: NAD, alert, cooperative with exam HEENT: NCAT CV: RRR, good S1/S2, no murmur Resp: CTABL, no wheezes, non-labored Ext: No edema, warm Neuro: Alert and oriented, strength 5/5 and sensation intact in all 4 extremities, cranial nerves II through XII intact, right-sided pupil with outpouching of the pupil around 10:00, she states this is stable since childhood. She has normal pupillary response and the left  Assessment and plan:  # Tension headaches Try baclofen schedule 2 weeks Also discussed supportive care such as heat and massage of the scalp Return to clinic with any concerns    Meds ordered this encounter  Medications  . baclofen (LIORESAL) 10 MG tablet    Sig: Take 1 tablet (10 mg total) by mouth 3 (three) times daily.    Dispense:  60 each    Refill:  0    Murtis Sink, MD [facility] 02/22/2016, 9:35 AM

## 2016-02-22 NOTE — Patient Instructions (Signed)
Great to see you!  You have tension headaches, try baclofen three times daily for 2 weeks, also try heat applied to your neck  PLease come back or call if you need anything or have any other concerns

## 2016-02-29 NOTE — Progress Notes (Signed)
Cardiology Office Note:    Date:  03/01/2016   ID:  Kelly [name], DOB 14-Oct-1940, MRN [id number]  PCP:  Mechele Claude, MD  Cardiologist:  Dr. Dietrich Pates   Electrophysiologist:  n/a  Referring MD: Mechele Claude, MD   Chief Complaint  Patient presents with  . Follow-up    Cardiomyopathy    History of Present Illness:     Kelly [name] is a 75 y.o. female with a hx of NICM with mild LV dysfunction, HTN and HL. EF has returned to normal. She has a history of normal coronary arteries by catheterization in 2002. Echocardiogram in 2015 demonstrated normal LV function with mild mitral regurgitation. Myoview in 2013 demonstrated normal perfusion.   Last seen by Cline Crock, PA-C in 1/17.  She returns for follow-up. She is overall doing well. She has dyspnea with more extreme activities. She denies chest pain. Denies syncope. Denies orthopnea or PND. She has chronic LE edema related to venous insufficiency.   Past Medical History  Diagnosis Date  . HLD (hyperlipidemia)   . Migraine   . HTN (hypertension)   . NICM (nonischemic cardiomyopathy) (HCC)     a. LHC 8/02: EF 55%, 2+ MR, normal cors;  b.  Echo 3/10: EF 50-55%, mild diast dysfn, mild MVP (ant leaflet), mild MR, mild TR, small pericard eff.;   c.  Myoview 3/10:  EF 69%, no ischemia;  d. Echo 9/13: EF 50-55%, Gr 1 diast dysfn, mild prolapse of ant MV leaflet;   e. Myoview 9/13: EF 64%, NL perfusion;  f.  Echo (11/15):  EF 50-55%, normal diastolic function, mild MR  . Pulmonary HTN (HCC)   . Depression with anxiety     Current Medications: Outpatient Prescriptions Prior to Visit  Medication Sig Dispense Refill  . aspirin 81 MG tablet Take 81 mg by mouth daily.    . baclofen (LIORESAL) 10 MG tablet Take 1 tablet (10 mg total) by mouth 3 (three) times daily. 60 each 0  . cetirizine (ZYRTEC) 10 MG tablet Take 10 mg by mouth daily as needed for allergies.    . cholecalciferol (VITAMIN D) 1000 UNITS tablet Take 1,000 Units  by mouth daily.    . clonazePAM (KLONOPIN) 0.5 MG tablet Take 0.5 mg by mouth daily as needed for anxiety.    . furosemide (LASIX) 40 MG tablet Take 1 tablet (40 mg total) by mouth daily. 90 tablet 3  . meclizine (ANTIVERT) 12.5 MG tablet Take 12.5 mg by mouth 2 (two) times daily as needed for dizziness.    . valsartan (DIOVAN) 80 MG tablet TAKE ONE TABLET BY MOUTH ONCE DAILY (NEEDS TO SCHEDULE APPOINTMENT) 90 tablet 3   No facility-administered medications prior to visit.      Allergies:   Codeine and Simvastatin   Social History   Social History  . Marital Status: Married    Spouse Name: N/A  . Number of Children: N/A  . Years of Education: N/A   Social History Main Topics  . Smoking status: Never Smoker   . Smokeless tobacco: Never Used  . Alcohol Use: No  . Drug Use: No  . Sexual Activity: Not Asked   Other Topics Concern  . None   Social History Narrative   ROS:   Please see the history of present illness.    Review of Systems  Cardiovascular: Positive for leg swelling.  Respiratory: Positive for cough and wheezing.   Musculoskeletal: Positive for joint pain and myalgias.   All  other systems reviewed and are negative.   Physical Exam:    VS:  BP 122/62 mmHg  Pulse 58  Ht  (1.575 m)  Wt 149 lb 1.9 oz (67.64 kg)  BMI 27.27 kg/m2   Physical Exam  Constitutional: She is oriented to person, place, and time. She appears well-developed and well-nourished. No distress.  HENT:  Head: Normocephalic and atraumatic.  Neck: No JVD present.  Cardiovascular: Normal rate, regular rhythm and normal heart sounds.   No murmur heard. Pulmonary/Chest: Effort normal. She has no wheezes. She has no rales.  Abdominal: Soft. She exhibits no mass. There is no tenderness.  Musculoskeletal:  Bilateral LE with multiple varicosities, no pitting edema  Neurological: She is alert and oriented to person, place, and time.  Skin: Skin is warm and dry.  Psychiatric: She has a normal  mood and affect.    Wt Readings from Last 3 Encounters:  03/01/16 149 lb 1.9 oz (67.64 kg)  02/22/16 152 lb 6.4 oz (69.128 kg)  12/29/15 151 lb (68.493 kg)      Studies/Labs Reviewed:     EKG:  EKG is  ordered today.  The ekg ordered today demonstrates Sinus brady, HR 58, low voltage, NSSTTW changes,  QTc 441 ms, no changes  Recent Labs: No results found for requested labs within last 365 days.   Recent Lipid Panel    Component Value Date/Time   CHOL 236* 02/16/2015 1209   CHOL 119 10/22/2010 0829   TRIG 120 02/16/2015 1209   HDL 53 02/16/2015 1209   HDL 46.80 10/22/2010 0829   CHOLHDL 4.5* 02/16/2015 1209   CHOLHDL 3 10/22/2010 0829   VLDL 14.6 10/22/2010 0829   LDLCALC 159* 02/16/2015 1209   LDLCALC 58 10/22/2010 0829   LDLDIRECT 151.3 08/16/2010 1118    Additional studies/ records that were reviewed today include:   - Echo (11/15): EF 50-55%, normal diastolic function, mild MR   ASSESSMENT:     1. Essential hypertension   2. NICM (nonischemic cardiomyopathy) (HCC)     PLAN:     In order of problems listed above:  1. HTN - Controlled.  Continue current regimen.  Obtain BMET today.  2. NICM - EF has returned to normal by previous echo. Continue ARB.  BMET today.   Medication Adjustments/Labs and Tests Ordered: Current medicines are reviewed at length with the patient today.  Concerns regarding medicines are outlined above.  Medication changes, Labs and Tests ordered today are outlined in the Patient Instructions noted below. Patient Instructions  Medication Instructions:  No changes.  See your medication list. Labwork: Today - BMET Testing/Procedures: None  Follow-Up: Dr. Dietrich Pates or Tereso Newcomer, PA-C in 1 year. Any Other Special Instructions Will Be Listed Below (If Applicable). If you need a refill on your cardiac medications before your next appointment, please call your pharmacy.    Signed, Tereso Newcomer, PA-C  03/01/2016 11:04 AM    [facility] [address] [place], [place], [place]  [address] Phone: [phone]; Fax: [phone]

## 2016-03-01 ENCOUNTER — Telehealth: Payer: Self-pay | Admitting: *Deleted

## 2016-03-01 ENCOUNTER — Encounter: Payer: Self-pay | Admitting: Physician Assistant

## 2016-03-01 ENCOUNTER — Ambulatory Visit (INDEPENDENT_AMBULATORY_CARE_PROVIDER_SITE_OTHER): Payer: Medicare Other | Admitting: Physician Assistant

## 2016-03-01 VITALS — BP 122/62 | HR 58 | Ht 62.0 in | Wt 149.1 lb

## 2016-03-01 DIAGNOSIS — I10 Essential (primary) hypertension: Secondary | ICD-10-CM | POA: Diagnosis not present

## 2016-03-01 DIAGNOSIS — I429 Cardiomyopathy, unspecified: Secondary | ICD-10-CM

## 2016-03-01 DIAGNOSIS — I428 Other cardiomyopathies: Secondary | ICD-10-CM

## 2016-03-01 LAB — BASIC METABOLIC PANEL
BUN: 23 mg/dL (ref 7–25)
CALCIUM: 9.6 mg/dL (ref 8.6–10.4)
CO2: 25 mmol/L (ref 20–31)
Chloride: 105 mmol/L (ref 98–110)
Creat: 1.23 mg/dL — ABNORMAL HIGH (ref 0.60–0.93)
GLUCOSE: 81 mg/dL (ref 65–99)
Potassium: 3.9 mmol/L (ref 3.5–5.3)
SODIUM: 143 mmol/L (ref 135–146)

## 2016-03-01 MED ORDER — FUROSEMIDE 40 MG PO TABS: 20.0000 mg | ORAL_TABLET | Freq: Every day | ORAL | Status: DC

## 2016-03-01 NOTE — Telephone Encounter (Signed)
Pt now notified per Tereso Newcomer, PA to hold lasix x 1 dose and then decrease to 20 mg daily, bmet in 1 week. Pt agreeable to new plan of care

## 2016-03-01 NOTE — Telephone Encounter (Signed)
Labs taken to DOD Dr. Anne Fu who advised for pt to decrease lasix to 20 mg daily due to creatinine increased , with bmet in 7-10 days. Pt asked agreeable to plan of care and asked for Rx to be sent so she can get lab work at PCP in [place]. I verified pt address and will mail Rx on Monday once Tereso Newcomer, [place] signs Rx.

## 2016-03-01 NOTE — Patient Instructions (Addendum)
Medication Instructions:  No changes.  See your medication list. Labwork: Today - BMET Testing/Procedures: None  Follow-Up: Dr. Dietrich Pates or Tereso Newcomer, PA-C in 1 year. Any Other Special Instructions Will Be Listed Below (If Applicable). If you need a refill on your cardiac medications before your next appointment, please call your pharmacy.

## 2016-03-06 ENCOUNTER — Telehealth: Payer: Self-pay | Admitting: Physician Assistant

## 2016-03-06 NOTE — Telephone Encounter (Signed)
New message     The assist to [place] [facility] on Monday told the pt she would sent the paperwork in the mail to have blood work done at [facility] and the pt hasn't heard from anyone she was wondering what happened

## 2016-03-06 NOTE — Telephone Encounter (Signed)
Spoke with pt and informed her that Kelly [name] mailed orders out on Monday and it will take about a week for her to receive this. Advised pt if she does not receive in the mail by Monday to let us know. Pt verbalized understanding and was in agreement with this plan.

## 2016-03-08 ENCOUNTER — Other Ambulatory Visit: Payer: Self-pay

## 2016-03-08 ENCOUNTER — Telehealth: Payer: Self-pay | Admitting: Physician Assistant

## 2016-03-08 ENCOUNTER — Other Ambulatory Visit: Payer: Medicare Other

## 2016-03-08 DIAGNOSIS — I159 Secondary hypertension, unspecified: Secondary | ICD-10-CM | POA: Diagnosis not present

## 2016-03-08 NOTE — Telephone Encounter (Signed)
New message      Pt is there to have labs drawn.  She has an order but they need labs put in epic under [facility] in order to draw her blood.  If any problems, please call

## 2016-03-08 NOTE — Telephone Encounter (Signed)
**Note De-Identified  Obfuscation** BMET has been ordered under [facility].

## 2016-03-09 LAB — BASIC METABOLIC PANEL
BUN/Creatinine Ratio: 13 (ref 12–28)
BUN: 13 mg/dL (ref 8–27)
CALCIUM: 9.3 mg/dL (ref 8.7–10.3)
CO2: 25 mmol/L (ref 18–29)
Chloride: 101 mmol/L (ref 96–106)
Creatinine, Ser: 0.97 mg/dL (ref 0.57–1.00)
GFR calc Af Amer: 65 mL/min/{1.73_m2} (ref >59)
GFR, EST NON AFRICAN AMERICAN: 57 mL/min/{1.73_m2} — AB (ref >59)
GLUCOSE: 78 mg/dL (ref 65–99)
POTASSIUM: 3.8 mmol/L (ref 3.5–5.2)
Sodium: 145 mmol/L — ABNORMAL HIGH (ref 134–144)

## 2016-04-05 ENCOUNTER — Other Ambulatory Visit: Payer: Self-pay | Admitting: Nurse Practitioner

## 2016-04-09 ENCOUNTER — Telehealth: Payer: Self-pay | Admitting: Family Medicine

## 2016-04-09 NOTE — Telephone Encounter (Signed)
Pt doesn't want to come in yet. She will call back to schedule appt and to have lab order put in.

## 2016-04-12 DIAGNOSIS — Z1231 Encounter for screening mammogram for malignant neoplasm of breast: Secondary | ICD-10-CM | POA: Diagnosis not present

## 2016-05-02 ENCOUNTER — Other Ambulatory Visit: Payer: Self-pay | Admitting: Family Medicine

## 2016-05-08 ENCOUNTER — Ambulatory Visit: Payer: Medicare Other

## 2016-05-10 ENCOUNTER — Ambulatory Visit (INDEPENDENT_AMBULATORY_CARE_PROVIDER_SITE_OTHER): Payer: Medicare Other | Admitting: Family Medicine

## 2016-05-10 ENCOUNTER — Ambulatory Visit (INDEPENDENT_AMBULATORY_CARE_PROVIDER_SITE_OTHER): Payer: Medicare Other

## 2016-05-10 ENCOUNTER — Encounter: Payer: Self-pay | Admitting: Family Medicine

## 2016-05-10 VITALS — BP 107/62 | HR 104 | Temp 97.6°F | Ht 62.0 in | Wt 149.1 lb

## 2016-05-10 DIAGNOSIS — I272 Other secondary pulmonary hypertension: Secondary | ICD-10-CM | POA: Diagnosis not present

## 2016-05-10 DIAGNOSIS — R0600 Dyspnea, unspecified: Secondary | ICD-10-CM

## 2016-05-10 DIAGNOSIS — J984 Other disorders of lung: Secondary | ICD-10-CM | POA: Diagnosis not present

## 2016-05-10 MED ORDER — SILDENAFIL CITRATE 20 MG PO TABS: 20.0000 mg | ORAL_TABLET | Freq: Three times a day (TID) | ORAL | 0 refills | Status: DC

## 2016-05-10 NOTE — Progress Notes (Signed)
Subjective:  Patient ID: Kelly [name], female    DOB: 21-May-1941  Age: 75 y.o. MRN: [id number]  CC: Shortness of Breath (pt here today c/o SOB with exertion, has pulmonary hypertension and enlarged heart)   HPI Kelly [name] presents for Intermittent dyspnea. This been going on for quite some time. She relates it to exertion. She has to lift things from time to time. This seems to contribute. Going up and down stairs it has contributed in the past. She had some problems several years ago and after some very rigorous testing by pulmonology she was diagnosed with pulmonary hypertension and placed on valsartan. She does not want to see pulmonary again because of how difficult the testing was when she went through it.   History Kelly [name] has a past medical history of Depression with anxiety; HLD (hyperlipidemia); HTN (hypertension); Migraine; NICM (nonischemic cardiomyopathy) ([place]); and Pulmonary HTN ([place]).   She has a past surgical history that includes Abdominal hysterectomy; Cholecystectomy; Appendectomy; and Eye surgery.   Her family history includes Asthma in her mother; COPD in her mother; Cancer in her sister; Heart attack in her brother; Heart disease in her brother and mother; Thrombosis in her father.She reports that she has never smoked. She has never used smokeless tobacco. She reports that she does not drink alcohol or use drugs.    ROS Review of Systems  Constitutional: Negative for activity change, appetite change and fever.  HENT: Negative for congestion, rhinorrhea and sore throat.   Eyes: Negative for visual disturbance.  Respiratory: Positive for shortness of breath and wheezing (occasional). Negative for cough, chest tightness and stridor.   Cardiovascular: Negative for chest pain and palpitations.  Gastrointestinal: Negative for abdominal pain, diarrhea and nausea.  Genitourinary: Negative for dysuria.  Musculoskeletal: Negative for arthralgias and myalgias.     Objective:  BP 107/62   Pulse (!) 104   Temp 97.6 F (36.4 C) (Oral)   Ht 5' 2"  (1.575 m)   Wt 149 lb 2 oz (67.6 kg)   SpO2 97%   BMI 27.28 kg/m   BP Readings from Last 3 Encounters:  05/10/16 107/62  03/01/16 122/62  02/22/16 (!) 109/57    Wt Readings from Last 3 Encounters:  05/10/16 149 lb 2 oz (67.6 kg)  03/01/16 149 lb 1.9 oz (67.6 kg)  02/22/16 152 lb 6.4 oz (69.1 kg)     Physical Exam  Constitutional: She is oriented to person, place, and time. She appears well-developed and well-nourished. No distress.  HENT:  Head: Normocephalic and atraumatic.  Right Ear: External ear normal.  Left Ear: External ear normal.  Nose: Nose normal.  Mouth/Throat: Oropharynx is clear and moist.  Eyes: Conjunctivae and EOM are normal. Pupils are equal, round, and reactive to light.  Neck: Normal range of motion. Neck supple. No thyromegaly present.  Cardiovascular: Normal rate, regular rhythm and normal heart sounds.   No murmur heard. Pulmonary/Chest: Effort normal and breath sounds normal. No respiratory distress. She has no wheezes. She has no rales.  Abdominal: Soft. Bowel sounds are normal. She exhibits no distension. There is no tenderness.  Lymphadenopathy:    She has no cervical adenopathy.  Neurological: She is alert and oriented to person, place, and time. She has normal reflexes.  Skin: Skin is warm and dry.  Psychiatric: She has a normal mood and affect. Her behavior is normal. Judgment and thought content normal.     Lab Results  Component Value Date   WBC 5.6 01/12/2015  HGB 13.6 01/12/2015   HCT 44.2 01/12/2015   PLT 205.0 06/15/2014   GLUCOSE 78 03/08/2016   CHOL 236 (H) 02/16/2015   TRIG 120 02/16/2015   HDL 53 02/16/2015   LDLDIRECT 151.3 08/16/2010   LDLCALC 159 (H) 02/16/2015   ALT 26 02/16/2015   AST 30 02/16/2015   NA 145 (H) 03/08/2016   K 3.8 03/08/2016   CL 101 03/08/2016   CREATININE 0.97 03/08/2016   BUN 13 03/08/2016   CO2 25  03/08/2016   TSH 0.62 06/15/2014    Dg Chest 2 View  Result Date: 04/23/2012 *RADIOLOGY REPORT* Clinical Data: COPD and chest pain CHEST - 2 VIEW Comparison: 06/03/06 Findings: Heart size appears mildly enlarged.  No pleural effusion or edema.  There are coarsened interstitial markings noted bilaterally.  The lungs appear hyperinflated.  No airspace consolidation noted. IMPRESSION: 1.  No acute cardiopulmonary abnormalities. Original Report Authenticated By: Angelita Ingles, M.D.    Assessment & Plan:   Camber was seen today for shortness of breath.  Diagnoses and all orders for this visit:  Dyspnea -     DG Chest 2 View; Future -     PR BREATHING CAPACITY TEST -     Cancel: PR EVAL OF BRONCHOSPASM -     D-dimer, quantitative (not at [facility]) -     CBC with Differential/Platelet -     CMP14+EGFR  Pulmonary HTN ([place]) -     DG Chest 2 View; Future -     PR BREATHING CAPACITY TEST -     Cancel: PR EVAL OF BRONCHOSPASM -     D-dimer, quantitative (not at [facility]) -     CBC with Differential/Platelet -     CMP14+EGFR  Restrictive lung disease -     D-dimer, quantitative (not at [facility]) -     CBC with Differential/Platelet -     CMP14+EGFR  Other orders -     sildenafil (REVATIO) 20 MG tablet; Take 1 tablet (20 mg total) by mouth 3 (three) times daily. For breathing difficulty from high blood pressure in the lungs      I am having Kelly [name] start on sildenafil. I am also having her maintain her aspirin, cholecalciferol, cetirizine, clonazePAM, valsartan, furosemide, meclizine, baclofen, and FLUZONE HIGH-DOSE.  Meds ordered this encounter  Medications  . FLUZONE HIGH-DOSE 0.5 ML SUSY  . sildenafil (REVATIO) 20 MG tablet    Sig: Take 1 tablet (20 mg total) by mouth 3 (three) times daily. For breathing difficulty from high blood pressure in the lungs    Dispense:  10 tablet    Refill:  0     Follow-up: Return in about 1 month (around 06/09/2016).  Claretta Fraise, M.D.

## 2016-05-11 ENCOUNTER — Other Ambulatory Visit: Payer: Self-pay | Admitting: *Deleted

## 2016-05-11 ENCOUNTER — Other Ambulatory Visit: Payer: Self-pay | Admitting: Family Medicine

## 2016-05-11 ENCOUNTER — Ambulatory Visit (HOSPITAL_COMMUNITY)
Admission: RE | Admit: 2016-05-11 | Discharge: 2016-05-11 | Disposition: A | Discharge status: Home / Self Care | Payer: Medicare Other | Source: Ambulatory Visit | Attending: Family Medicine | Admitting: Family Medicine

## 2016-05-11 DIAGNOSIS — I700 Atherosclerosis of aorta: Secondary | ICD-10-CM | POA: Diagnosis not present

## 2016-05-11 DIAGNOSIS — R0600 Dyspnea, unspecified: Secondary | ICD-10-CM | POA: Insufficient documentation

## 2016-05-11 DIAGNOSIS — I517 Cardiomegaly: Secondary | ICD-10-CM | POA: Insufficient documentation

## 2016-05-11 DIAGNOSIS — R918 Other nonspecific abnormal finding of lung field: Secondary | ICD-10-CM | POA: Insufficient documentation

## 2016-05-11 DIAGNOSIS — R079 Chest pain, unspecified: Secondary | ICD-10-CM | POA: Insufficient documentation

## 2016-05-11 LAB — CBC WITH DIFFERENTIAL/PLATELET
BASOS ABS: 0 10*3/uL (ref 0.0–0.2)
Basos: 0 %
EOS (ABSOLUTE): 0.2 10*3/uL (ref 0.0–0.4)
Eos: 3 %
Hematocrit: 42 % (ref 34.0–46.6)
Hemoglobin: 13.8 g/dL (ref 11.1–15.9)
Immature Grans (Abs): 0 10*3/uL (ref 0.0–0.1)
Immature Granulocytes: 0 %
LYMPHS ABS: 2.3 10*3/uL (ref 0.7–3.1)
Lymphs: 30 %
MCH: 31.2 pg (ref 26.6–33.0)
MCHC: 32.9 g/dL (ref 31.5–35.7)
MCV: 95 fL (ref 79–97)
Monocytes Absolute: 0.3 10*3/uL (ref 0.1–0.9)
Monocytes: 4 %
NEUTROS ABS: 4.7 10*3/uL (ref 1.4–7.0)
Neutrophils: 63 %
PLATELETS: 212 10*3/uL (ref 150–379)
RBC: 4.43 x10E6/uL (ref 3.77–5.28)
RDW: 14.4 % (ref 12.3–15.4)
WBC: 7.4 10*3/uL (ref 3.4–10.8)

## 2016-05-11 LAB — CMP14+EGFR
ALK PHOS: 69 IU/L (ref 39–117)
ALT: 22 IU/L (ref 0–32)
AST: 27 IU/L (ref 0–40)
Albumin/Globulin Ratio: 1.9 (ref 1.2–2.2)
Albumin: 4.8 g/dL (ref 3.5–4.8)
BILIRUBIN TOTAL: 1.5 mg/dL — AB (ref 0.0–1.2)
BUN / CREAT RATIO: 19 (ref 12–28)
BUN: 21 mg/dL (ref 8–27)
CHLORIDE: 105 mmol/L (ref 96–106)
CO2: 25 mmol/L (ref 18–29)
Calcium: 10.4 mg/dL — ABNORMAL HIGH (ref 8.7–10.3)
Creatinine, Ser: 1.13 mg/dL — ABNORMAL HIGH (ref 0.57–1.00)
GFR calc Af Amer: 54 mL/min/{1.73_m2} — ABNORMAL LOW (ref >59)
GFR calc non Af Amer: 47 mL/min/{1.73_m2} — ABNORMAL LOW (ref >59)
GLUCOSE: 94 mg/dL (ref 65–99)
Globulin, Total: 2.5 g/dL (ref 1.5–4.5)
Potassium: 5.4 mmol/L — ABNORMAL HIGH (ref 3.5–5.2)
Sodium: 147 mmol/L — ABNORMAL HIGH (ref 134–144)
Total Protein: 7.3 g/dL (ref 6.0–8.5)

## 2016-05-11 LAB — D-DIMER, QUANTITATIVE (NOT AT ARMC): D-DIMER: 2.96 mg{FEU}/L — AB (ref 0.00–0.49)

## 2016-05-11 MED ORDER — IOPAMIDOL (ISOVUE-370) INJECTION 76%
INTRAVENOUS | Status: AC
  Filled 2016-05-11: qty 100

## 2016-05-13 ENCOUNTER — Other Ambulatory Visit (HOSPITAL_COMMUNITY): Payer: Self-pay | Admitting: Family Medicine

## 2016-05-13 ENCOUNTER — Encounter (HOSPITAL_COMMUNITY): Payer: Self-pay

## 2016-05-13 ENCOUNTER — Telehealth: Payer: Self-pay | Admitting: Family Medicine

## 2016-05-13 ENCOUNTER — Ambulatory Visit (HOSPITAL_COMMUNITY)
Admission: RE | Admit: 2016-05-13 | Discharge: 2016-05-13 | Disposition: A | Discharge status: Home / Self Care | Payer: Medicare Other | Source: Ambulatory Visit | Attending: Family Medicine | Admitting: Family Medicine

## 2016-05-13 ENCOUNTER — Other Ambulatory Visit: Payer: Self-pay | Admitting: Family Medicine

## 2016-05-13 DIAGNOSIS — R791 Abnormal coagulation profile: Secondary | ICD-10-CM

## 2016-05-13 DIAGNOSIS — I872 Venous insufficiency (chronic) (peripheral): Secondary | ICD-10-CM | POA: Diagnosis not present

## 2016-05-13 DIAGNOSIS — R0600 Dyspnea, unspecified: Secondary | ICD-10-CM | POA: Insufficient documentation

## 2016-05-13 DIAGNOSIS — R918 Other nonspecific abnormal finding of lung field: Secondary | ICD-10-CM

## 2016-05-13 DIAGNOSIS — I700 Atherosclerosis of aorta: Secondary | ICD-10-CM

## 2016-05-13 DIAGNOSIS — R7989 Other specified abnormal findings of blood chemistry: Secondary | ICD-10-CM

## 2016-05-13 DIAGNOSIS — I878 Other specified disorders of veins: Secondary | ICD-10-CM | POA: Diagnosis not present

## 2016-05-13 HISTORY — PX: IR GENERIC HISTORICAL: IMG1180011

## 2016-05-13 MED ORDER — LIDOCAINE HCL 1 % IJ SOLN
INTRAMUSCULAR | Status: DC | PRN
  Administered 2016-05-13: 2 mL

## 2016-05-13 MED ORDER — IOPAMIDOL (ISOVUE-370) INJECTION 76%
INTRAVENOUS | Status: AC
  Administered 2016-05-13: 100 mL
  Filled 2016-05-13: qty 100

## 2016-05-13 MED ORDER — LEVOFLOXACIN 500 MG PO TABS: 500.0000 mg | ORAL_TABLET | Freq: Every day | ORAL | 0 refills | Status: DC

## 2016-05-13 MED ORDER — LIDOCAINE HCL 1 % IJ SOLN
INTRAMUSCULAR | Status: AC
  Filled 2016-05-13: qty 20

## 2016-05-14 ENCOUNTER — Other Ambulatory Visit: Payer: Self-pay | Admitting: *Deleted

## 2016-05-14 MED ORDER — SILDENAFIL CITRATE 20 MG PO TABS: 20.0000 mg | ORAL_TABLET | Freq: Three times a day (TID) | ORAL | 0 refills | Status: DC

## 2016-05-16 ENCOUNTER — Telehealth: Payer: Self-pay

## 2016-05-17 NOTE — Telephone Encounter (Signed)
Please contact the patient let her know and see if she will see pulmonary. If so, please refer.

## 2016-05-17 NOTE — Telephone Encounter (Signed)
Patient denies pulmonology at this time. But will think about it and call back

## 2016-05-22 ENCOUNTER — Ambulatory Visit (INDEPENDENT_AMBULATORY_CARE_PROVIDER_SITE_OTHER): Payer: Medicare Other | Admitting: Pediatrics

## 2016-05-22 ENCOUNTER — Telehealth: Payer: Self-pay | Admitting: Family Medicine

## 2016-05-22 ENCOUNTER — Encounter: Payer: Self-pay | Admitting: Pediatrics

## 2016-05-22 VITALS — BP 119/83 | HR 100 | Temp 97.6°F | Ht 62.0 in | Wt 149.0 lb

## 2016-05-22 DIAGNOSIS — Z23 Encounter for immunization: Secondary | ICD-10-CM | POA: Diagnosis not present

## 2016-05-22 DIAGNOSIS — L03115 Cellulitis of right lower limb: Secondary | ICD-10-CM | POA: Diagnosis not present

## 2016-05-22 DIAGNOSIS — I700 Atherosclerosis of aorta: Secondary | ICD-10-CM | POA: Diagnosis not present

## 2016-05-22 MED ORDER — VALSARTAN 80 MG PO TABS: ORAL_TABLET | ORAL | 1 refills | Status: DC

## 2016-05-22 MED ORDER — PROMETHAZINE HCL 12.5 MG PO TABS: 12.5000 mg | ORAL_TABLET | Freq: Two times a day (BID) | ORAL | 0 refills | Status: DC

## 2016-05-22 MED ORDER — CEPHALEXIN 500 MG PO CAPS: 500.0000 mg | ORAL_CAPSULE | Freq: Two times a day (BID) | ORAL | 0 refills | Status: DC

## 2016-05-22 MED ORDER — PRAVASTATIN SODIUM 40 MG PO TABS: 40.0000 mg | ORAL_TABLET | Freq: Every day | ORAL | 3 refills | Status: DC

## 2016-05-22 NOTE — Patient Instructions (Signed)
Keflex (cephalexin) twice a day for 7 days If leg not improving over next couple of weeks come back to be seen Start pravastatin 40mg  once a day for cholesterol Tetanus shot today

## 2016-05-22 NOTE — Telephone Encounter (Signed)
Patient does not have any refills on the divan. Do you want the patient to stay on the 80 and take it daily? Please advise and send back to the pools.

## 2016-05-22 NOTE — Telephone Encounter (Signed)
Notified pt of recommendation Refill sent into [facility] per pt request Okayed per Dr Darlyn Read

## 2016-05-22 NOTE — Telephone Encounter (Signed)
Please contact the patient she can resume diovan

## 2016-05-22 NOTE — Telephone Encounter (Signed)
Okay at current level for 6 mos. Thanks ws 

## 2016-05-22 NOTE — Progress Notes (Addendum)
  Subjective:   Patient ID: [name], female    DOB: April 09, 1941, 75 y.o.   MRN: [id number] CC: Wound Infection (Right leg) and Leg Pain  HPI: Kelly [name] is a 75 y.o. female presenting for Wound Infection (Right leg) and Leg Pain  2 weeks ago both of her legs were caught under a metal fence  L leg improving, still with scab over wound R foot R leg now with increasing pain around site of scab, some redness No drainage from wound. Some pain when weight bearing isnt sure when her last tetanus shot was No fevers  Recently had CTA to r/o PE, found to have aortic atherosclerosis No other artery disease per pt, no h/o MI or CVA Does have pulm HTN  Relevant past medical, surgical, family and social history reviewed. Allergies and medications reviewed and updated. History  Smoking Status  . Never Smoker  Smokeless Tobacco  . Never Used   ROS: Per HPI   Objective:    BP 119/83   Pulse 100   Temp 97.6 F (36.4 C) (Oral)   Ht 5\' 2"  (1.575 m)   Wt 149 lb (67.6 kg)   BMI 27.25 kg/m   Wt Readings from Last 3 Encounters:  05/22/16 149 lb (67.6 kg)  05/10/16 149 lb 2 oz (67.6 kg)  03/01/16 149 lb 1.9 oz (67.6 kg)    Gen: NAD, alert, cooperative with exam, NCAT EYES: EOMI, no conjunctival injection, or no icterus CV: NRRR, normal S1/S2, no murmur, 1+ DP pulse R side Resp: CTABL, no wheezes, normal WOB Ext: No edema, warm Neuro: Alert and oriented Skin: apprx 1 cm slightly depressed ulcer R anterior shin, scabbed over portion with upper area with red granulation tissue  Assessment & Plan:  Kelly [name] was seen today for wound infection and leg pain.  Diagnoses and all orders for this visit:  Cellulitis of right lower extremity -     cephALEXin (KEFLEX) 500 MG capsule; Take 1 capsule (500 mg total) by mouth 2 (two) times daily. -     promethazine (PHENERGAN) 12.5 MG tablet; Take 1 tablet (12.5 mg total) by mouth every 12 (twelve) hours.  Aortic atherosclerosis (HCC) -      pravastatin (PRAVACHOL) 40 MG tablet; Take 1 tablet (40 mg total) by mouth daily.  Other orders -     Td : Tetanus/diphtheria >7yo Preservative  free   Follow up plan: Return 2 weeks if leg not improving. Rex Kras, MD [place] [facility]

## 2016-05-22 NOTE — Telephone Encounter (Signed)
Insurance will not cover sildenafil. Patient would like something else called in or wants to know if she can get back on diovan? Please advise and send back to the pools.

## 2016-05-27 ENCOUNTER — Encounter: Payer: Self-pay | Admitting: Family Medicine

## 2016-05-27 ENCOUNTER — Ambulatory Visit (INDEPENDENT_AMBULATORY_CARE_PROVIDER_SITE_OTHER): Payer: Medicare Other | Admitting: Family Medicine

## 2016-05-27 VITALS — BP 111/69 | HR 106 | Ht 62.0 in | Wt 150.0 lb

## 2016-05-27 DIAGNOSIS — R234 Changes in skin texture: Secondary | ICD-10-CM | POA: Diagnosis not present

## 2016-05-27 DIAGNOSIS — I272 Pulmonary hypertension, unspecified: Secondary | ICD-10-CM

## 2016-05-27 MED ORDER — COLLAGENASE 250 UNIT/GM EX OINT: 1.0000 "application " | TOPICAL_OINTMENT | Freq: Every day | CUTANEOUS | 0 refills | Status: DC

## 2016-05-27 MED ORDER — SILDENAFIL CITRATE 20 MG PO TABS: 20.0000 mg | ORAL_TABLET | Freq: Three times a day (TID) | ORAL | 5 refills | Status: DC

## 2016-05-27 NOTE — Progress Notes (Signed)
Subjective:  Patient ID: Kelly MannersSally F [name], female    DOB: 04/07/1941  Age: 75 y.o. MRN: [id number]  CC: discuss CT   HPI Kelly [name] presents for Continuing to be short of breath periodically. She reports dyspnea on exertion. There is no cough. These symptoms are fairly consistent regardless of activity. Lying down does not affect it. She denies edema. She has not been able to get she is due to insurance restrictions.Revatio requires cardiology or pulmonology to write the prescription. She had a bad experience with pulmonology in the past but she is willing to see cardiology. Currently her symptoms are stable she denies having had any worsening of them since her last visit here. CT scan reviewed showing multiple findings. These are reviewed with the patient and reproduced below.  Patient is initiated on the right shin from an accident while mowing a lawn a couple of weeks ago. It has not been healing properly. History Kelly [name] has a past medical history of Depression with anxiety; HLD (hyperlipidemia); HTN (hypertension); Migraine; NICM (nonischemic cardiomyopathy) (HCC); and Pulmonary HTN.   She has a past surgical history that includes Abdominal hysterectomy; Cholecystectomy; Appendectomy; Eye surgery; ir generic historical (05/13/2016); and ir generic historical (05/13/2016).   Her family history includes Asthma in her mother; COPD in her mother; Cancer in her sister; Heart attack in her brother; Heart disease in her brother and mother; Thrombosis in her father.She reports that she has never smoked. She has never used smokeless tobacco. She reports that she does not drink alcohol or use drugs.    ROS Review of Systems  Constitutional: Negative for activity change, appetite change and fever.  HENT: Negative for congestion, rhinorrhea and sore throat.   Eyes: Negative for visual disturbance.  Respiratory: Positive for shortness of breath. Negative for cough and chest tightness.     Cardiovascular: Negative for chest pain, palpitations and leg swelling.  Gastrointestinal: Negative for abdominal pain, diarrhea and nausea.  Genitourinary: Negative for dysuria.  Musculoskeletal: Positive for arthralgias. Negative for myalgias.    Objective:  BP 111/69   Pulse (!) 106   Ht 5\' 2"  (1.575 m)   Wt 150 lb (68 kg)   BMI 27.44 kg/m   BP Readings from Last 3 Encounters:  05/27/16 111/69  05/22/16 119/83  05/10/16 107/62    Wt Readings from Last 3 Encounters:  05/27/16 150 lb (68 kg)  05/22/16 149 lb (67.6 kg)  05/10/16 149 lb 2 oz (67.6 kg)     Physical Exam  Constitutional: She is oriented to person, place, and time. She appears well-developed and well-nourished. No distress.  HENT:  Head: Normocephalic and atraumatic.  Right Ear: External ear normal.  Left Ear: External ear normal.  Nose: Nose normal.  Mouth/Throat: Oropharynx is clear and moist.  Eyes: Conjunctivae and EOM are normal. Pupils are equal, round, and reactive to light.  Neck: Normal range of motion. Neck supple. No thyromegaly present.  Cardiovascular: Normal rate, regular rhythm and normal heart sounds.   No murmur heard. Pulmonary/Chest: Effort normal and breath sounds normal. No respiratory distress. She has no wheezes. She has no rales.  Abdominal: Soft. Bowel sounds are normal. She exhibits no distension. There is no tenderness.  Lymphadenopathy:    She has no cervical adenopathy.  Neurological: She is alert and oriented to person, place, and time. She has normal reflexes.  Skin: Skin is warm and dry.  There is a 1 cm eschar with a small amount of exudative material attached.  2 cm ring of hyperemic tissue around the margin without signs of infection.  Psychiatric: She has a normal mood and affect. Her behavior is normal. Judgment and thought content normal.     Lab Results  Component Value Date   WBC 7.4 05/10/2016   HGB 13.6 01/12/2015   HCT 42.0 05/10/2016   PLT 212 05/10/2016    GLUCOSE 94 05/10/2016   CHOL 236 (H) 02/16/2015   TRIG 120 02/16/2015   HDL 53 02/16/2015   LDLDIRECT 151.3 08/16/2010   LDLCALC 159 (H) 02/16/2015   ALT 22 05/10/2016   AST 27 05/10/2016   NA 147 (H) 05/10/2016   K 5.4 (H) 05/10/2016   CL 105 05/10/2016   CREATININE 1.13 (H) 05/10/2016   BUN 21 05/10/2016   CO2 25 05/10/2016   TSH 0.62 06/15/2014    Ct Angio Chest Pe W Or Wo Contrast  Result Date: 05/13/2016 CLINICAL DATA:  Dyspnea and chest pain.  Pulmonary hypertension. EXAM: CT ANGIOGRAPHY CHEST WITH CONTRAST TECHNIQUE: Multidetector CT imaging of the chest was performed using the standard protocol during bolus administration of intravenous contrast. Multiplanar CT image reconstructions and MIPs were obtained to evaluate the vascular anatomy. CONTRAST:  100 cc Isovue 370 IV. COMPARISON:  05/10/2016 chest radiograph. FINDINGS: Cardiovascular: The study is high quality for the evaluation of pulmonary embolism. There are no filling defects in the central, lobar, segmental or subsegmental pulmonary artery branches to suggest acute pulmonary embolism. Atherosclerotic nonaneurysmal thoracic aorta. Normal caliber main pulmonary artery (2.4 cm diameter). Mild cardiomegaly with dilated right atrium. No significant pericardial fluid/thickening. Mediastinum/Nodes: No discrete thyroid nodules. Unremarkable esophagus. No pathologically enlarged axillary, mediastinal or hilar lymph nodes. Lungs/Pleura: No pneumothorax. No pleural effusion. There is a mosaic attenuation throughout both lungs. No lung masses or significant pulmonary nodules. Parenchymal bands in the right middle lobe, lingula and left lower lobe are most consistent with postinfectious/ postinflammatory scarring. There is a patchy focus of confluent ground-glass attenuation and minimal patchy consolidation in the peripheral right lower lobe (series 5/ image 86). No additional regions of acute consolidative airspace disease. Diffuse  bronchial wall thickening . Upper abdomen: Unremarkable. Musculoskeletal: No aggressive appearing focal osseous lesions. Mild thoracic spondylosis. Review of the MIP images confirms the above findings. IMPRESSION: 1. No pulmonary embolism. 2. Mild cardiomegaly with right atrial dilatation. 3. Mosaic attenuation throughout both lungs, a nonspecific finding that can be due to pulmonary vascular disease or air trapping from small airways disease. 4. Small patchy focus of confluent ground-glass attenuation and minimal patchy consolidation in the peripheral right lower lobe, which could represent a mild/developing pneumonia. 5. Nonspecific diffuse bronchial wall thickening, probably inflammatory. 6. Aortic atherosclerosis. Electronically Signed   By: Delbert Phenix M.D.   On: 05/13/2016 11:33   I   On: 05/13/2016 10:43    Assessment & Plan:   Jazzlyn was seen today for discuss ct.  Diagnoses and all orders for this visit:  Pulmonary HTN -     Ambulatory referral to Cardiology  Eschar of lower leg  Other orders -     collagenase (SANTYL) ointment; Apply 1 application topically daily. -     sildenafil (REVATIO) 20 MG tablet; Take 1 tablet (20 mg total) by mouth 3 (three) times daily. For breathing difficulty from high blood pressure in the lungs    The patient is going to attempt to obtain Revatio by paying cash through an independent pharmacy. Prescription as noted above.  I am having Ms. Lung start on collagenase.  I am also having her maintain her aspirin, cholecalciferol, cetirizine, clonazePAM, furosemide, meclizine, baclofen, FLUZONE HIGH-DOSE, valsartan, cephALEXin, pravastatin, promethazine, and sildenafil.  Meds ordered this encounter  Medications  . collagenase (SANTYL) ointment    Sig: Apply 1 application topically daily.    Dispense:  15 g    Refill:  0  . sildenafil (REVATIO) 20 MG tablet    Sig: Take 1 tablet (20 mg total) by mouth 3 (three) times daily. For breathing  difficulty from high blood pressure in the lungs    Dispense:  100 tablet    Refill:  5     Follow-up: Return in about 1 week (around 06/03/2016).  Mechele Claude, M.D.

## 2016-06-03 ENCOUNTER — Ambulatory Visit (INDEPENDENT_AMBULATORY_CARE_PROVIDER_SITE_OTHER): Payer: Medicare Other

## 2016-06-03 ENCOUNTER — Encounter: Payer: Self-pay | Admitting: Family Medicine

## 2016-06-03 ENCOUNTER — Ambulatory Visit (INDEPENDENT_AMBULATORY_CARE_PROVIDER_SITE_OTHER): Payer: Medicare Other | Admitting: Family Medicine

## 2016-06-03 VITALS — BP 102/57 | HR 101 | Temp 96.8°F | Ht 62.0 in | Wt 147.0 lb

## 2016-06-03 DIAGNOSIS — R0602 Shortness of breath: Secondary | ICD-10-CM

## 2016-06-03 DIAGNOSIS — M25561 Pain in right knee: Secondary | ICD-10-CM

## 2016-06-03 DIAGNOSIS — I272 Pulmonary hypertension, unspecified: Secondary | ICD-10-CM | POA: Diagnosis not present

## 2016-06-03 DIAGNOSIS — L259 Unspecified contact dermatitis, unspecified cause: Secondary | ICD-10-CM | POA: Diagnosis not present

## 2016-06-03 DIAGNOSIS — L989 Disorder of the skin and subcutaneous tissue, unspecified: Secondary | ICD-10-CM | POA: Diagnosis not present

## 2016-06-03 NOTE — Progress Notes (Signed)
Subjective:  Patient ID: Moses MannersSally F Crepeau, female    DOB: 03/01/1941  Age: 75 y.o. MRN: [id number]  CC: Follow-up (1 week wound rck)   HPI Moses MannersSally F Bazzano presents for Recheck of right shin lesion. It is getting worse. That is it is more painful. It has some pink around it. She is using the Santyl daily but it still has some eschar over the margin. She is concerned that based on the cause of injury that there is possibly a foreign body. There is some pain with ambulation. Patient is localized to the right shin, it is not referred.  Patient still has lesion on the left posterior thigh. She would like to have that removed and biopsied.  Patient says that the heaviness in the chest has gone since she started the sildenafil. She would like to discontinue the Diovan. She denies systemic high blood pressure.   History [name] has a past medical history of Depression with anxiety; HLD (hyperlipidemia); HTN (hypertension); Migraine; NICM (nonischemic cardiomyopathy) (HCC); and Pulmonary HTN.   She has a past surgical history that includes Abdominal hysterectomy; Cholecystectomy; Appendectomy; Eye surgery; ir generic historical (05/13/2016); and ir generic historical (05/13/2016).   Her family history includes Asthma in her mother; COPD in her mother; Cancer in her sister; Heart attack in her brother; Heart disease in her brother and mother; Thrombosis in her father.She reports that she has never smoked. She has never used smokeless tobacco. She reports that she does not drink alcohol or use drugs.    ROS Review of Systems  Constitutional: Negative for activity change, appetite change and fever.  HENT: Negative for congestion, rhinorrhea and sore throat.   Eyes: Negative for visual disturbance.  Respiratory: Negative for cough and shortness of breath.   Cardiovascular: Negative for chest pain and palpitations.  Gastrointestinal: Negative for abdominal pain, diarrhea and nausea.  Genitourinary:  Negative for dysuria.  Musculoskeletal: Negative for arthralgias and myalgias.    Objective:  BP (!) 102/57 (BP Location: Right Arm)   Pulse (!) 101   Temp (!) 96.8 F (36 C) (Oral)   Ht 5\' 2"  (1.575 m)   Wt 147 lb (66.7 kg)   BMI 26.89 kg/m   BP Readings from Last 3 Encounters:  06/03/16 (!) 102/57  05/27/16 111/69  05/22/16 119/83    Wt Readings from Last 3 Encounters:  06/03/16 147 lb (66.7 kg)  05/27/16 150 lb (68 kg)  05/22/16 149 lb (67.6 kg)     Physical Exam  Constitutional: She is oriented to person, place, and time. She appears well-developed and well-nourished. No distress.  HENT:  Head: Normocephalic and atraumatic.  Eyes: Conjunctivae are normal. Pupils are equal, round, and reactive to light.  Neck: Normal range of motion. Neck supple. No thyromegaly present.  Cardiovascular: Normal rate, regular rhythm and normal heart sounds.   No murmur heard. Pulmonary/Chest: Effort normal and breath sounds normal. No respiratory distress. She has no wheezes. She has no rales.  Abdominal: Soft. Bowel sounds are normal. She exhibits no distension. There is no tenderness.  Musculoskeletal: Normal range of motion.  Lymphadenopathy:    She has no cervical adenopathy.  Neurological: She is alert and oriented to person, place, and time.  Skin: Skin is warm and dry.  There is a 1 cm lesion at the anterior right shin at the mid shaft. It has an overlying eschar that is loose on 2 sides but still tightly connected. There is minimal necrotic debris as well. Melvia HeapsUnderneath is  a developing granulation bed.  There is a 2 cm erythematous lesion with a scaly overlying the on the distal posterior left calf.  Psychiatric: She has a normal mood and affect. Her behavior is normal. Judgment and thought content normal.     Lab Results  Component Value Date   WBC 7.4 05/10/2016   HGB 13.6 01/12/2015   HCT 42.0 05/10/2016   PLT 212 05/10/2016   GLUCOSE 94 05/10/2016   CHOL 236 (H)  02/16/2015   TRIG 120 02/16/2015   HDL 53 02/16/2015   LDLDIRECT 151.3 08/16/2010   LDLCALC 159 (H) 02/16/2015   ALT 22 05/10/2016   AST 27 05/10/2016   NA 147 (H) 05/10/2016   K 5.4 (H) 05/10/2016   CL 105 05/10/2016   CREATININE 1.13 (H) 05/10/2016   BUN 21 05/10/2016   CO2 25 05/10/2016   TSH 0.62 06/15/2014       Assessment & Plan:   Temaka was seen today for follow-up.  Diagnoses and all orders for this visit:  Arthralgia of right lower leg -     DG Tibia/Fibula Right; Future  Skin lesion of left lower extremity -     Pathology  Pulmonary HTN  Shortness of breath  Skin lesion of left leg   improvement in the pulmonary hypertension based on decreased chest pain. She should discontinue the Diovan  After sterile prep and drape and local with 2% lidocaine. A 4 mm punch was used to remove the lower margin. 1 suture was used to close the lesion. Wound care was reviewed. Suture removal in 10 days.  I have discontinued Ms. White's baclofen, FLUZONE HIGH-DOSE, valsartan, and cephALEXin. I am also having her maintain her aspirin, cholecalciferol, cetirizine, clonazePAM, furosemide, meclizine, pravastatin, promethazine, collagenase, and sildenafil.  No orders of the defined types were placed in this encounter.    Follow-up: Return in about 10 days (around 06/13/2016).  Mechele Claude, M.D.

## 2016-06-06 ENCOUNTER — Telehealth: Payer: Self-pay | Admitting: Family Medicine

## 2016-06-06 LAB — PATHOLOGY

## 2016-06-07 ENCOUNTER — Other Ambulatory Visit: Payer: Self-pay | Admitting: Family Medicine

## 2016-06-07 MED ORDER — FLUOCINONIDE-E 0.05 % EX CREA: 1.0000 "application " | TOPICAL_CREAM | Freq: Two times a day (BID) | CUTANEOUS | 5 refills | Status: DC

## 2016-06-10 ENCOUNTER — Other Ambulatory Visit: Payer: Self-pay | Admitting: Family Medicine

## 2016-06-10 MED ORDER — HYDROXYZINE HCL 25 MG PO TABS: 25.0000 mg | ORAL_TABLET | Freq: Four times a day (QID) | ORAL | 5 refills | Status: DC | PRN

## 2016-06-10 NOTE — Telephone Encounter (Signed)
I sent in the requested prescription 

## 2016-06-10 NOTE — Telephone Encounter (Signed)
Pt aware Rx sent to pharmacy 

## 2016-06-10 NOTE — Telephone Encounter (Signed)
Requesting prescription for hydroxyzine to keep on hand for itching. States that she has intensely pruritic reactions to insect bites, grass, and other allergens. She has taken this in the past and it worked well.

## 2016-06-14 ENCOUNTER — Ambulatory Visit (INDEPENDENT_AMBULATORY_CARE_PROVIDER_SITE_OTHER): Payer: Medicare Other | Admitting: Family Medicine

## 2016-06-14 ENCOUNTER — Encounter: Payer: Self-pay | Admitting: Family Medicine

## 2016-06-14 VITALS — BP 120/59 | HR 107 | Temp 97.0°F | Ht 62.0 in | Wt 148.1 lb

## 2016-06-14 DIAGNOSIS — R234 Changes in skin texture: Secondary | ICD-10-CM | POA: Diagnosis not present

## 2016-06-14 DIAGNOSIS — L989 Disorder of the skin and subcutaneous tissue, unspecified: Secondary | ICD-10-CM

## 2016-06-14 DIAGNOSIS — I272 Pulmonary hypertension, unspecified: Secondary | ICD-10-CM | POA: Diagnosis not present

## 2016-06-14 NOTE — Progress Notes (Signed)
Subjective:  Patient ID: Kelly [name], female    DOB: April 27, 1941  Age: 75 y.o. MRN: [id number]  CC: Suture / Staple Removal (pt here today to have suture taken out of left calf)   HPI Kelly [name] presents for suture removal. Also notes that dyspnea, chest heaviness have resolved. Has appt. Soon with cardiology to get sildenafil approved for pulmonary hypertension. She has the prescription for lidex. Ready to start using for the eczema once suture is out.    History Kelly [name] has a past medical history of Depression with anxiety; HLD (hyperlipidemia); HTN (hypertension); Migraine; NICM (nonischemic cardiomyopathy) ([facility]); and Pulmonary HTN.   She has a past surgical history that includes Abdominal hysterectomy; Cholecystectomy; Appendectomy; Eye surgery; ir generic historical (05/13/2016); and ir generic historical (05/13/2016).   Her family history includes Asthma in her mother; COPD in her mother; Cancer in her sister; Heart attack in her brother; Heart disease in her brother and mother; Thrombosis in her father.She reports that she has never smoked. She has never used smokeless tobacco. She reports that she does not drink alcohol or use drugs.    ROS Review of Systems  Constitutional: Negative for activity change, appetite change and fever.  HENT: Negative for congestion, rhinorrhea and sore throat.   Eyes: Negative for visual disturbance.  Respiratory: Negative for cough and shortness of breath.   Cardiovascular: Negative for chest pain and palpitations.  Gastrointestinal: Negative for abdominal pain, diarrhea and nausea.  Genitourinary: Negative for dysuria.  Musculoskeletal: Negative for arthralgias and myalgias.    Objective:  BP (!) 120/59   Pulse (!) 107   Temp 97 F (36.1 C) (Oral)   Ht 5' 2"  (1.575 m)   Wt 148 lb 2 oz (67.2 kg)   BMI 27.09 kg/m   BP Readings from Last 3 Encounters:  06/14/16 (!) 120/59  06/03/16 (!) 102/57  05/27/16 111/69    Wt Readings  from Last 3 Encounters:  06/14/16 148 lb 2 oz (67.2 kg)  06/03/16 147 lb (66.7 kg)  05/27/16 150 lb (68 kg)     Physical Exam  Constitutional: She is oriented to person, place, and time. She appears well-developed and well-nourished. No distress.  HENT:  Head: Normocephalic and atraumatic.  Eyes: Conjunctivae are normal. Pupils are equal, round, and reactive to light.  Neck: Normal range of motion. Neck supple. No thyromegaly present.  Cardiovascular: Normal rate, regular rhythm and normal heart sounds.   No murmur heard. Pulmonary/Chest: Effort normal and breath sounds normal. No respiratory distress. She has no wheezes. She has no rales.  Abdominal: Soft. Bowel sounds are normal. She exhibits no distension. There is no tenderness.  Musculoskeletal: Normal range of motion.  Lymphadenopathy:    She has no cervical adenopathy.  Neurological: She is alert and oriented to person, place, and time.  Skin: Skin is warm and dry. Rash (erythema and scale with suture at the margin, posteromedial left leg.) noted.  Suture removed without difficulty. Wound inspected, nml   Psychiatric: She has a normal mood and affect. Her behavior is normal. Judgment and thought content normal.     Lab Results  Component Value Date   WBC 7.4 05/10/2016   HGB 13.6 01/12/2015   HCT 42.0 05/10/2016   PLT 212 05/10/2016   GLUCOSE 94 05/10/2016   CHOL 236 (H) 02/16/2015   TRIG 120 02/16/2015   HDL 53 02/16/2015   LDLDIRECT 151.3 08/16/2010   LDLCALC 159 (H) 02/16/2015   ALT 22 05/10/2016  AST 27 05/10/2016   NA 147 (H) 05/10/2016   K 5.4 (H) 05/10/2016   CL 105 05/10/2016   CREATININE 1.13 (H) 05/10/2016   BUN 21 05/10/2016   CO2 25 05/10/2016   TSH 0.62 06/15/2014    Ct Angio Chest Pe W Or Wo Contrast  Result Date: 05/13/2016 CLINICAL DATA:  Dyspnea and chest pain.  Pulmonary hypertension. EXAM: CT ANGIOGRAPHY CHEST WITH CONTRAST TECHNIQUE: Multidetector CT imaging of the chest was performed  using the standard protocol during bolus administration of intravenous contrast. Multiplanar CT image reconstructions and MIPs were obtained to evaluate the vascular anatomy. CONTRAST:  100 cc Isovue 370 IV. COMPARISON:  05/10/2016 chest radiograph. FINDINGS: Cardiovascular: The study is high quality for the evaluation of pulmonary embolism. There are no filling defects in the central, lobar, segmental or subsegmental pulmonary artery branches to suggest acute pulmonary embolism. Atherosclerotic nonaneurysmal thoracic aorta. Normal caliber main pulmonary artery (2.4 cm diameter). Mild cardiomegaly with dilated right atrium. No significant pericardial fluid/thickening. Mediastinum/Nodes: No discrete thyroid nodules. Unremarkable esophagus. No pathologically enlarged axillary, mediastinal or hilar lymph nodes. Lungs/Pleura: No pneumothorax. No pleural effusion. There is a mosaic attenuation throughout both lungs. No lung masses or significant pulmonary nodules. Parenchymal bands in the right middle lobe, lingula and left lower lobe are most consistent with postinfectious/ postinflammatory scarring. There is a patchy focus of confluent ground-glass attenuation and minimal patchy consolidation in the peripheral right lower lobe (series 5/ image 86). No additional regions of acute consolidative airspace disease. Diffuse bronchial wall thickening . Upper abdomen: Unremarkable. Musculoskeletal: No aggressive appearing focal osseous lesions. Mild thoracic spondylosis. Review of the MIP images confirms the above findings. IMPRESSION: 1. No pulmonary embolism. 2. Mild cardiomegaly with right atrial dilatation. 3. Mosaic attenuation throughout both lungs, a nonspecific finding that can be due to pulmonary vascular disease or air trapping from small airways disease. 4. Small patchy focus of confluent ground-glass attenuation and minimal patchy consolidation in the peripheral right lower lobe, which could represent a  mild/developing pneumonia. 5. Nonspecific diffuse bronchial wall thickening, probably inflammatory. 6. Aortic atherosclerosis. Electronically Signed   By: Ilona Sorrel M.D.   On: 05/13/2016 11:33   Ir US Guide Vasc Access Right  Result Date: 05/13/2016 INDICATION: Poor venous access. Venous access required for CT scan with contrast. EXAM: IR RADIOLOGY PERIPHERAL GUIDED IV START; IR ULTRASOUND GUIDANCE VASC ACCESS RIGHT MEDICATIONS: 1% Lidocaine. ANESTHESIA/SEDATION: No sedation medication given. FLUOROSCOPY TIME:  Fluoroscopy not used. COMPLICATIONS: None immediate. PROCEDURE: Informed written consent was obtained from the patient after a thorough discussion of the procedural risks, benefits and alternatives. All questions were addressed. Maximal Sterile Barrier Technique was utilized including caps, mask, sterile gowns, sterile gloves, sterile drape, hand hygiene and skin antiseptic. A timeout was performed prior to the initiation of the procedure. The right upper extremity was prepped with chlorhexidine in a sterile fashion, and a sterile drape was applied covering the operative field. Maximum barrier sterile technique with sterile gowns and gloves were used for the procedure. A timeout was performed prior to the initiation of the procedure. Local anesthesia was provided with 1% lidocaine. Under direct ultrasound guidance, the right basilic vein was accessed with a micropuncture kit after the overlying soft tissues were anesthetized with 1% lidocaine. An ultrasound image was saved for documentation purposes. A guidewire was inserted and the micropuncture needle was removed. The micro catheter sheath was placed over the wire and the wire was removed. It easily aspirated and flushed and was secured in  place with a sterile dressing and Tegaderm. The patient tolerated the procedure well without immediate post procedural complication. IMPRESSION: Successful ultrasound guided vascular access. Read by:  Gareth Eagle,  PA-C Electronically Signed   By: Jacqulynn Cadet M.D.   On: 05/13/2016 10:43   Ir Radiology Peripheral Guided Iv Start  Result Date: 05/13/2016 INDICATION: Poor venous access. Venous access required for CT scan with contrast. EXAM: IR RADIOLOGY PERIPHERAL GUIDED IV START; IR ULTRASOUND GUIDANCE VASC ACCESS RIGHT MEDICATIONS: 1% Lidocaine. ANESTHESIA/SEDATION: No sedation medication given. FLUOROSCOPY TIME:  Fluoroscopy not used. COMPLICATIONS: None immediate. PROCEDURE: Informed written consent was obtained from the patient after a thorough discussion of the procedural risks, benefits and alternatives. All questions were addressed. Maximal Sterile Barrier Technique was utilized including caps, mask, sterile gowns, sterile gloves, sterile drape, hand hygiene and skin antiseptic. A timeout was performed prior to the initiation of the procedure. The right upper extremity was prepped with chlorhexidine in a sterile fashion, and a sterile drape was applied covering the operative field. Maximum barrier sterile technique with sterile gowns and gloves were used for the procedure. A timeout was performed prior to the initiation of the procedure. Local anesthesia was provided with 1% lidocaine. Under direct ultrasound guidance, the right basilic vein was accessed with a micropuncture kit after the overlying soft tissues were anesthetized with 1% lidocaine. An ultrasound image was saved for documentation purposes. A guidewire was inserted and the micropuncture needle was removed. The micro catheter sheath was placed over the wire and the wire was removed. It easily aspirated and flushed and was secured in place with a sterile dressing and Tegaderm. The patient tolerated the procedure well without immediate post procedural complication. IMPRESSION: Successful ultrasound guided vascular access. Read by:  Gareth Eagle, PA-C Electronically Signed   By: Jacqulynn Cadet M.D.   On: 05/13/2016 10:43    Assessment & Plan:    Kelly [name] was seen today for suture / staple removal.  Diagnoses and all orders for this visit:  Pulmonary HTN  Skin lesion of left leg  Eschar of lower leg      I am having Ms. Steffenhagen maintain her aspirin, cholecalciferol, cetirizine, clonazePAM, furosemide, meclizine, pravastatin, promethazine, collagenase, sildenafil, fluocinonide-emollient, hydrOXYzine, and diptheria-tetanus toxoids.  Meds ordered this encounter  Medications  . diptheria-tetanus toxoids [facility]) 2-2 LF/0.5ML injection     Follow-up: Return in about 3 months (around 09/14/2016), or if symptoms worsen or fail to improve.  Claretta Fraise, M.D.

## 2016-06-17 ENCOUNTER — Ambulatory Visit: Payer: Medicare Other | Admitting: Cardiology

## 2016-06-17 NOTE — Progress Notes (Signed)
Cardiology Office Note   Date:  06/18/2016   ID:  Kelly MannersSally F Enochs, DOB 12/23/1940, MRN [id number]  PCP:  Mechele ClaudeSTACKS,WARREN, MD  Cardiologist:   Rollene RotundaJames Daleon Willinger, MD  Referring:  Mechele ClaudeSTACKS,WARREN, MD  No chief complaint on file.     History of Present Illness: Kelly [name] is a 75 y.o. female who presents for follow up of NICM with previous mild LV dysfunction.  Her most recent echo demonstrated a preserved EF.  She has a history of normal coronary arteries by catheterization in 2002. Echocardiogram in 2015 demonstrated normal LV function with mild mitral regurgitation. Myoview in 2013 demonstrated normal perfusion.  She saw Tereso NewcomerScott Weaver PA C in July.  At that time she was not particularly complaining of shortness of breath. She said she did walk 600 yards without being short of breath but now she is walking 50 yards and getting short of breath. She's not describing PND or orthopnea. She does think that she has some mild chest tightness when she does activity. She's not having any new weight gain or edema. She doesn't notice any palpitations, presyncope or syncope. She did have a CT which suggested right atrial dilatation. There was some patchy groundglass opacification. There was aortic atherosclerosis. The patient was referred for further evaluation.   Past Medical History:  Diagnosis Date  . Depression with anxiety   . HLD (hyperlipidemia)   . HTN (hypertension)   . Migraine   . NICM (nonischemic cardiomyopathy) ([facility])    a. LHC 8/02: EF 55%, 2+ MR, normal cors;  b.  Echo 3/10: EF 50-55%, mild diast dysfn, mild MVP (ant leaflet), mild MR, mild TR, small pericard eff.;   c.  Myoview 3/10:  EF 69%, no ischemia;  d. Echo 9/13: EF 50-55%, Gr 1 diast dysfn, mild prolapse of ant MV leaflet;   e. Myoview 9/13: EF 64%, NL perfusion;  f.  Echo (11/15):  EF 50-55%, normal diastolic function, mild MR  . Pulmonary HTN     Past Surgical History:  Procedure Laterality Date  . ABDOMINAL  HYSTERECTOMY    . APPENDECTOMY    . CHOLECYSTECTOMY    . EYE SURGERY    . IR GENERIC HISTORICAL  05/13/2016   IR RADIOLOGY PERIPHERAL GUIDED IV START 05/13/2016 Malachy MoanHeath McCullough, MD [facility]  . IR GENERIC HISTORICAL  05/13/2016   IR US GUIDE VASC ACCESS RIGHT 05/13/2016 Malachy MoanHeath McCullough, MD [facility]     Current Outpatient Prescriptions  Medication Sig Dispense Refill  . aspirin 81 MG tablet Take 81 mg by mouth daily.    . cetirizine (ZYRTEC) 10 MG tablet Take 10 mg by mouth daily as needed for allergies.    . cholecalciferol (VITAMIN D) 1000 UNITS tablet Take 1,000 Units by mouth daily.    . clonazePAM (KLONOPIN) 0.5 MG tablet Take 0.5 mg by mouth daily as needed for anxiety.    . collagenase (SANTYL) ointment Apply 1 application topically daily. 15 g 0  . diptheria-tetanus toxoids (DECAVAC) 2-2 LF/0.5ML injection     . fluocinonide-emollient (LIDEX-E) 0.05 % cream Apply 1 application topically 2 (two) times daily. To affected areas 60 g 5  . furosemide (LASIX) 40 MG tablet Take 0.5 tablets (20 mg total) by mouth daily.    . hydrOXYzine (ATARAX/VISTARIL) 25 MG tablet Take 1 tablet (25 mg total) by mouth every 6 (six) hours as needed for itching. 30 tablet 5  . meclizine (ANTIVERT) 12.5 MG tablet TAKE 1 TABLET BY MOUTH EVERY 12  TO 24 HOURS AS NEEDED 40 tablet 0  . pravastatin (PRAVACHOL) 40 MG tablet Take 1 tablet (40 mg total) by mouth daily. 30 tablet 3  . sildenafil (REVATIO) 20 MG tablet Take 1 tablet (20 mg total) by mouth 3 (three) times daily. For breathing difficulty from high blood pressure in the lungs 100 tablet 5   No current facility-administered medications for this visit.     Allergies:   Codeine and Simvastatin    ROS:  Please see the history of present illness.   Otherwise, review of systems are positive for none.   All other systems are reviewed and negative.    PHYSICAL EXAM: VS:  BP 113/79 (BP Location: Right Arm)   Pulse 93   Ht 5\' 2"  (1.575 m)   Wt  148 lb (67.1 kg)   SpO2 97%   BMI 27.07 kg/m  , BMI Body mass index is 27.07 kg/m. GENERAL:  Well appearing HEENT:  Pupils equal round and reactive, fundi not visualized, oral mucosa unremarkable NECK:  Positive jugular venous distention, waveform within normal limits, carotid upstroke brisk and symmetric, no bruits, no thyromegaly LYMPHATICS:  No cervical, inguinal adenopathy LUNGS:  Clear to auscultation bilaterally BACK:  No CVA tenderness CHEST:  Unremarkable HEART:  PMI not displaced or sustained,S1 and S2 within normal limits, no S3,  no clicks, no rubs, irregular murmurs ABD:  Flat, positive bowel sounds normal in frequency in pitch, no bruits, no rebound, no guarding, no midline pulsatile mass, no hepatomegaly, no splenomegaly EXT:  2 plus pulses throughout, no edema, no cyanosis no clubbing SKIN:  No rashes no nodules NEURO:  Cranial nerves II through XII grossly intact, motor grossly intact throughout PSYCH:  Cognitively intact, oriented to person place and time    EKG:  EKG is ordered today. The ekg ordered today demonstrates atrial fibrillation, rate 116, low voltage, premature ventricular contractions, no acute ST-T wave changes   Recent Labs: 05/10/2016: ALT 22; BUN 21; Creatinine, Ser 1.13; Platelets 212; Potassium 5.4; Sodium 147    Lipid Panel    Component Value Date/Time   CHOL 236 (H) 02/16/2015 1209   TRIG 120 02/16/2015 1209   HDL 53 02/16/2015 1209   CHOLHDL 4.5 (H) 02/16/2015 1209   CHOLHDL 3 10/22/2010 0829   VLDL 14.6 10/22/2010 0829   LDLCALC 159 (H) 02/16/2015 1209   LDLDIRECT 151.3 08/16/2010 1118      Wt Readings from Last 3 Encounters:  06/18/16 148 lb (67.1 kg)  06/14/16 148 lb 2 oz (67.2 kg)  06/03/16 147 lb (66.7 kg)      Other studies Reviewed: Additional studies/ records that were reviewed today include: CT. Review of the above records demonstrates:  Please see elsewhere in the note.     ASSESSMENT AND PLAN:  HTN:   The blood  pressure is at target. No change in medications is indicated. We will continue with therapeutic lifestyle changes (TLC).  CM:  She has had a mildly reduced ejection fraction in the past and this will be quantified as below. For now she will remain on the meds as listed and she does not seem to be excessively volume overloaded.  ATRIAL FIB:    I suspect that this has been going on since September as her heart rate suddenly jumped up in the 50s and 60s to the 100s at that time. This is in keeping with her symptom onset.  Ms. DEEPA BARTHEL has a CHA2DS2 - VASc score of 4 with  a risk of stroke of 4%.    She has no contraindications to anticoagulation as I'm going to start her on Eliquis. I'm going to start Toprol-XL 25 mg and she is going a little bit fast. She will need an echocardiogram. I will go ahead and schedule cardioversion for about 4 weeks from now but I'll see her before then as well. She'll have labs to include a BNP level and a TSH. She's had a relatively recent CBC and basic metabolic profile and I will likely repeat this at the time of her next appointment. We had a long discussion about the risk benefits of anticoagulation. She will stop aspirin.  PULM HTN:  This may be the etiology for her dyspnea and atrial fibrillation but will be quantified with an echocardiogram as above. For now she'll continue the meds as listed.  Current medicines are reviewed at length with the patient today.  The patient does not have concerns regarding medicines.  The following changes have been made:  As above.   Labs/ tests ordered today include:   Orders Placed This Encounter  Procedures  . EKG 12-Lead     Disposition:   FU with or APP in two weeks in [place] office.     Signed, Rollene Rotunda, MD  06/18/2016 2:34 PM    [facility]

## 2016-06-18 ENCOUNTER — Encounter: Payer: Self-pay | Admitting: Cardiology

## 2016-06-18 ENCOUNTER — Ambulatory Visit: Payer: Medicare Other | Admitting: Cardiology

## 2016-06-18 ENCOUNTER — Ambulatory Visit (INDEPENDENT_AMBULATORY_CARE_PROVIDER_SITE_OTHER): Payer: Medicare Other | Admitting: Cardiology

## 2016-06-18 ENCOUNTER — Ambulatory Visit (INDEPENDENT_AMBULATORY_CARE_PROVIDER_SITE_OTHER): Payer: Medicare Other

## 2016-06-18 VITALS — BP 113/79 | HR 93 | Ht 62.0 in | Wt 148.0 lb

## 2016-06-18 DIAGNOSIS — I4891 Unspecified atrial fibrillation: Secondary | ICD-10-CM

## 2016-06-18 DIAGNOSIS — R0602 Shortness of breath: Secondary | ICD-10-CM | POA: Diagnosis not present

## 2016-06-18 MED ORDER — METOPROLOL SUCCINATE ER 25 MG PO TB24: 25.0000 mg | ORAL_TABLET | Freq: Every day | ORAL | 6 refills | Status: DC

## 2016-06-18 MED ORDER — APIXABAN 5 MG PO TABS: 5.0000 mg | ORAL_TABLET | Freq: Two times a day (BID) | ORAL | 6 refills | Status: DC

## 2016-06-18 MED ORDER — APIXABAN 5 MG PO TABS: 5.0000 mg | ORAL_TABLET | Freq: Two times a day (BID) | ORAL | 0 refills | Status: DC

## 2016-06-18 NOTE — Patient Instructions (Addendum)
Medication Instructions:   Stop Aspirin.  Begin Eliquis 5mg  twice a day.  Samples & savings card given today.  Begin Toprol XL 25mg  daily.  Continue all other medications.    Labwork: BNP, TSH - orders given today.   Testing/Procedures:  DCCV - Cardioversion - to be scheduled in 4 weeks.  Will call with date & time.  Your physician has requested that you have an echocardiogram. Echocardiography is a painless test that uses sound waves to create images of your heart. It provides your doctor with information about the size and shape of your heart and how well your heart's chambers and valves are working. This procedure takes approximately one hour. There are no restrictions for this procedure. Your physician has recommended that you wear a  24 hour holter monitor. Holter monitors are medical devices that record the heart's electrical activity. Doctors most often use these monitors to diagnose arrhythmias. Arrhythmias are problems with the speed or rhythm of the heartbeat. The monitor is a small, portable device. You can wear one while you do your normal daily activities. This is usually used to diagnose what is causing palpitations/syncope (passing out).  Follow-Up: 2 weeks   Any Other Special Instructions Will Be Listed Below (If Applicable).  If you need a refill on your cardiac medications before your next appointment, please call your pharmacy.

## 2016-06-19 ENCOUNTER — Other Ambulatory Visit (INDEPENDENT_AMBULATORY_CARE_PROVIDER_SITE_OTHER): Payer: Medicare Other

## 2016-06-19 DIAGNOSIS — Z7689 Persons encountering health services in other specified circumstances: Secondary | ICD-10-CM | POA: Diagnosis not present

## 2016-06-19 DIAGNOSIS — I4891 Unspecified atrial fibrillation: Secondary | ICD-10-CM | POA: Diagnosis not present

## 2016-06-19 DIAGNOSIS — R0602 Shortness of breath: Secondary | ICD-10-CM | POA: Diagnosis not present

## 2016-06-21 ENCOUNTER — Encounter: Payer: Self-pay | Admitting: Cardiology

## 2016-06-21 ENCOUNTER — Telehealth: Payer: Self-pay | Admitting: Family Medicine

## 2016-06-21 NOTE — Telephone Encounter (Signed)
Patient's husband aware °

## 2016-06-21 NOTE — Telephone Encounter (Signed)
Please contact the patient eliquis is compatible with sildenafil

## 2016-06-21 NOTE — Telephone Encounter (Signed)
Cardiology put patient on eliquis. They just want to verify that she is supposed to stay on the sildenafil that you placed her on and continue to stop the diovan? Please advise

## 2016-06-25 ENCOUNTER — Encounter: Payer: Self-pay | Admitting: Cardiology

## 2016-06-26 ENCOUNTER — Other Ambulatory Visit: Payer: Self-pay

## 2016-06-26 ENCOUNTER — Ambulatory Visit (INDEPENDENT_AMBULATORY_CARE_PROVIDER_SITE_OTHER): Payer: Medicare Other

## 2016-06-26 DIAGNOSIS — I4891 Unspecified atrial fibrillation: Secondary | ICD-10-CM | POA: Diagnosis not present

## 2016-06-27 ENCOUNTER — Telehealth: Payer: Self-pay | Admitting: *Deleted

## 2016-06-27 ENCOUNTER — Encounter: Payer: Self-pay | Admitting: *Deleted

## 2016-06-27 NOTE — Telephone Encounter (Signed)
Notes Recorded by Lesle Chris, LPN on 67/10/4191 at 5:25 PM EST Patient notified. Copy to pmd. Follow up already scheduled for 07/01/2016 with Dr. Antoine Poche. ------  Notes Recorded by Rollene Rotunda, MD on 06/27/2016 at 9:48 AM EST The EF is reduced compared to previous at 35%. She has moderate MR. I will follow this clinically. For now the plan is as outlined and I will likely work up for ischemia in the future. She has no active ischemic symptoms. She does have DOE.

## 2016-06-30 NOTE — Progress Notes (Signed)
Cardiology Office Note   Date:  07/01/2016   ID:  Kelly [name], DOB 08/20/1940, MRN [id number]  PCP:  Mechele ClaudeSTACKS,WARREN, MD  Cardiologist:   Rollene RotundaJames Amy Belloso, MD  Referring:  Mechele ClaudeSTACKS,WARREN, MD  Chief Complaint  Patient presents with  . Atrial Fibrillation  . Shortness of Breath      History of Present Illness: Kelly MannersSally F [name] is a 75 y.o. female who presents for follow up of NICM.  She has a history of normal coronary arteries by catheterization in 2002. Echocardiogram in 2015 demonstrated normal LV function with mild mitral regurgitation. Myoview in 2013 demonstrated normal perfusion.  She saw Kelly NewcomerScott Weaver PA C in July.  At that time she was not particularly complaining of shortness of breath. She said she did walk 600 yards without being short of breath but when I saw her recently she was SOB walking 50 yards.   At that appt she was found to be in atrial fib.  Echo demonstrated an EF of 35%.  She has moderate MR.  Today I reviewed the Holter which demonstrated atrial fib with rapid rate.   She has not tolerated the anticoagulation.  She is as SOB as she was before or perhaps slightly less.  She is complaining of back pain.  The patient denies any new symptoms such as chest discomfort, neck or arm discomfort. There has been no new PND or orthopnea. There have been no reported palpitations, presyncope or syncope.  Past Medical History:  Diagnosis Date  . Depression with anxiety   . HLD (hyperlipidemia)   . HTN (hypertension)   . Migraine   . NICM (nonischemic cardiomyopathy) (HCC)    a. LHC 8/02: EF 55%, 2+ MR, normal cors;  b.  Echo 3/10: EF 50-55%, mild diast dysfn, mild MVP (ant leaflet), mild MR, mild TR, small pericard eff.;   c.  Myoview 3/10:  EF 69%, no ischemia;  d. Echo 9/13: EF 50-55%, Gr 1 diast dysfn, mild prolapse of ant MV leaflet;   e. Myoview 9/13: EF 64%, NL perfusion;  f.  Echo (11/15):  EF 50-55%, normal diastolic function, mild MR  . Pulmonary HTN     Past  Surgical History:  Procedure Laterality Date  . ABDOMINAL HYSTERECTOMY    . APPENDECTOMY    . CHOLECYSTECTOMY    . EYE SURGERY    . IR GENERIC HISTORICAL  05/13/2016   IR RADIOLOGY PERIPHERAL GUIDED IV START 05/13/2016 Malachy MoanHeath McCullough, MD [facility]  . IR GENERIC HISTORICAL  05/13/2016   IR US GUIDE VASC ACCESS RIGHT 05/13/2016 Malachy MoanHeath McCullough, MD [facility]     Current Outpatient Prescriptions  Medication Sig Dispense Refill  . apixaban (ELIQUIS) 5 MG TABS tablet Take 1 tablet (5 mg total) by mouth 2 (two) times daily. 28 tablet 0  . cetirizine (ZYRTEC) 10 MG tablet Take 10 mg by mouth daily as needed for allergies.    . cholecalciferol (VITAMIN D) 1000 UNITS tablet Take 1,000 Units by mouth daily.    . clonazePAM (KLONOPIN) 0.5 MG tablet Take 0.5 mg by mouth daily as needed for anxiety.    . collagenase (SANTYL) ointment Apply 1 application topically daily. 15 g 0  . diptheria-tetanus toxoids (DECAVAC) 2-2 LF/0.5ML injection     . fluocinonide-emollient (LIDEX-E) 0.05 % cream Apply 1 application topically 2 (two) times daily. To affected areas 60 g 5  . furosemide (LASIX) 40 MG tablet Take 1 tablet (40 mg total) by mouth daily. 30 tablet 9  .  hydrOXYzine (ATARAX/VISTARIL) 25 MG tablet Take 1 tablet (25 mg total) by mouth every 6 (six) hours as needed for itching. 30 tablet 5  . meclizine (ANTIVERT) 12.5 MG tablet TAKE 1 TABLET BY MOUTH EVERY 12 TO 24 HOURS AS NEEDED 40 tablet 0  . metoprolol succinate (TOPROL XL) 25 MG 24 hr tablet Take 1 tablet (25 mg total) by mouth daily. 30 tablet 6  . pravastatin (PRAVACHOL) 40 MG tablet Take 1 tablet (40 mg total) by mouth daily. 30 tablet 3  . sildenafil (REVATIO) 20 MG tablet Take 1 tablet (20 mg total) by mouth 3 (three) times daily. For breathing difficulty from high blood pressure in the lungs 100 tablet 5   No current facility-administered medications for this visit.     Allergies:   Codeine and Simvastatin    ROS:  Please see the  history of present illness.   Otherwise, review of systems are positive for none.   All other systems are reviewed and negative.    PHYSICAL EXAM: VS:  BP 100/78 (BP Location: Right Arm, Patient Position: Sitting, Cuff Size: Normal)   Pulse 70   Ht 5\' 2"  (1.575 m)   Wt 147 lb 3.2 oz (66.8 kg)   BMI 26.92 kg/m  , BMI Body mass index is 26.92 kg/m. GENERAL:  Well appearing HEENT:  Pupils equal round and reactive, fundi not visualized, oral mucosa unremarkable NECK:  Positive jugular venous distention, waveform within normal limits, carotid upstroke brisk and symmetric, no bruits, no thyromegaly LYMPHATICS:  No cervical, inguinal adenopathy LUNGS:  Clear to auscultation bilaterally BACK:  No CVA tenderness CHEST:  Unremarkable HEART:  PMI not displaced or sustained,S1 and S2 within normal limits, no S3,  no clicks, no rubs, irregular murmurs ABD:  Flat, positive bowel sounds normal in frequency in pitch, no bruits, no rebound, no guarding, no midline pulsatile mass, no hepatomegaly, no splenomegaly EXT:  2 plus pulses throughout, no edema, no cyanosis no clubbing   EKG:  EKG is not ordered today.    Recent Labs: 05/10/2016: ALT 22; BUN 21; Creatinine, Ser 1.13; Platelets 212; Potassium 5.4; Sodium 147    Lipid Panel    Component Value Date/Time   CHOL 236 (H) 02/16/2015 1209   TRIG 120 02/16/2015 1209   HDL 53 02/16/2015 1209   CHOLHDL 4.5 (H) 02/16/2015 1209   CHOLHDL 3 10/22/2010 0829   VLDL 14.6 10/22/2010 0829   LDLCALC 159 (H) 02/16/2015 1209   LDLDIRECT 151.3 08/16/2010 1118      Wt Readings from Last 3 Encounters:  07/01/16 147 lb 3.2 oz (66.8 kg)  06/18/16 148 lb (67.1 kg)  06/14/16 148 lb 2 oz (67.2 kg)      Other studies Reviewed: Additional studies/ records that were reviewed today include: Holter. Review of the above records demonstrates:  See below   ASSESSMENT AND PLAN:  HTN:   The blood pressure is at target. No change in medications is indicated.  We will continue with therapeutic lifestyle changes (TLC).  CM:  She has had a moderately reduced ejection fraction.  I will follow up after the cardioversion.  I asked her to increase her Lasix to 40 mg daily.    ATRIAL FIB:    Kelly [name] has a CHA2DS2 - VASc score of 4 with a risk of stroke of 4%.   She was started on Eliquis.    I have scheduled cardioversion.    PULM HTN:  This was mild.  No  change in therapy is planned.   Current medicines are reviewed at length with the patient today.  The patient does not have concerns regarding medicines.  The following changes have been made:  As above.   Labs/ tests ordered today include:   Orders Placed This Encounter  Procedures  . ELECTRICAL CARDIOVERSION  . CBC  . Basic Metabolic Panel (BMET)  . INR/PT  . APTT     Disposition:   FU with or APP in two weeks in [place] office.     Signed, Rollene Rotunda, MD  07/01/2016 8:20 AM    [facility]

## 2016-07-01 ENCOUNTER — Telehealth: Payer: Self-pay | Admitting: *Deleted

## 2016-07-01 ENCOUNTER — Encounter: Payer: Self-pay | Admitting: Cardiology

## 2016-07-01 ENCOUNTER — Ambulatory Visit (INDEPENDENT_AMBULATORY_CARE_PROVIDER_SITE_OTHER): Payer: Medicare Other | Admitting: Cardiology

## 2016-07-01 VITALS — BP 100/78 | HR 70 | Ht 62.0 in | Wt 147.2 lb

## 2016-07-01 DIAGNOSIS — Z01818 Encounter for other preprocedural examination: Secondary | ICD-10-CM

## 2016-07-01 DIAGNOSIS — I481 Persistent atrial fibrillation: Secondary | ICD-10-CM

## 2016-07-01 DIAGNOSIS — D689 Coagulation defect, unspecified: Secondary | ICD-10-CM | POA: Diagnosis not present

## 2016-07-01 DIAGNOSIS — I4819 Other persistent atrial fibrillation: Secondary | ICD-10-CM

## 2016-07-01 MED ORDER — FUROSEMIDE 40 MG PO TABS: 40.0000 mg | ORAL_TABLET | Freq: Every day | ORAL | 9 refills | Status: DC

## 2016-07-01 NOTE — Addendum Note (Signed)
Addended by: Barrie Dunker on: 07/01/2016 09:04 AM   Modules accepted: Orders

## 2016-07-01 NOTE — Telephone Encounter (Signed)
Eliquis 5mg  approved through 08/18/2017 under Medicare Pard D benefit.

## 2016-07-01 NOTE — Patient Instructions (Signed)
Medication Instructions:  INCREASE- Furosemide 40 mg daily  Labwork: Per Op Labs  Testing/Procedures: Your physician has recommended that you have a Cardioversion (DCCV) at [facility]. Electrical Cardioversion uses a jolt of electricity to your heart either through paddles or wired patches attached to your chest. This is a controlled, usually prescheduled, procedure. Defibrillation is done under light anesthesia in the hospital, and you usually go home the day of the procedure. This is done to get your heart back into a normal rhythm. You are not awake for the procedure. Please see the instruction sheet given to you today.  Follow-Up: Your physician recommends that you schedule a follow-up appointment in: After Cardioversion   Any Other Special Instructions Will Be Listed Below (If Applicable).         Happy Thanksgiving  If you need a refill on your cardiac medications before your next appointment, please call your pharmacy.

## 2016-07-02 ENCOUNTER — Other Ambulatory Visit: Payer: Self-pay | Admitting: *Deleted

## 2016-07-02 DIAGNOSIS — Z01818 Encounter for other preprocedural examination: Secondary | ICD-10-CM

## 2016-07-02 NOTE — Patient Instructions (Signed)
Kelly MannersSally F Zacher  07/02/2016     @PREFPERIOPPHARMACY @   Your procedure is scheduled on 07/05/2016.  [facility]  Call this number if you have problems the morning of surgery:  [phone]   Remember:  Do not eat food or drink liquids after midnight.  Take these medicines the morning of surgery with A SIP OF WATER Zyrtec, Klonopin, Toprol, Sildeinafil  DO NOT SKIP ANY DOSES OF ELIQUIS   Do not wear jewelry, make-up or nail polish.  Do not wear lotions, powders, or perfumes, or deoderant.  Do not shave 48 hours prior to surgery.  Men may shave face and neck.  Do not bring valuables to the hospital.  [facility] belongings or valuables.  Contacts, dentures or bridgework may not be worn into surgery.  Leave your suitcase in the car.  After surgery it may be brought to your room.  For patients admitted to the hospital, discharge time will be determined by your treatment team.  Patients discharged the day of surgery will not be allowed to drive home.   Please read over the following fact sheets that you were given. Anesthesia Post-op Instructions     PATIENT INSTRUCTIONS POST-ANESTHESIA  IMMEDIATELY FOLLOWING SURGERY:  Do not drive or operate machinery for the first twenty four hours after surgery.  Do not make any important decisions for twenty four hours after surgery or while taking narcotic pain medications or sedatives.  If you develop intractable nausea and vomiting or a severe headache please notify your doctor immediately.  FOLLOW-UP:  Please make an appointment with your surgeon as instructed. You do not need to follow up with anesthesia unless specifically instructed to do so.  WOUND CARE INSTRUCTIONS (if applicable):  Keep a dry clean dressing on the anesthesia/puncture wound site if there is drainage.  Once the wound has quit draining you may leave it open to air.  Generally you should leave the bandage intact for  twenty four hours unless there is drainage.  If the epidural site drains for more than 36-48 hours please call the anesthesia department.  QUESTIONS?:  Please feel free to call your physician or the hospital operator if you have any questions, and they will be happy to assist you.      Electrical Cardioversion Electrical cardioversion is the delivery of a jolt of electricity to restore a normal rhythm to the heart. A rhythm that is too fast or is not regular keeps the heart from pumping well. In this procedure, sticky patches or metal paddles are placed on the chest to deliver electricity to the heart from a device. This procedure may be done in an emergency if:  There is low or no blood pressure as a result of the heart rhythm.  Normal rhythm must be restored as fast as possible to protect the brain and heart from further damage.  It may save a life. This procedure may also be done for irregular or fast heart rhythms that are not immediately life-threatening. Tell a health care provider about:  Any allergies you have.  All medicines you are taking, including vitamins, herbs, eye drops, creams, and over-the-counter medicines.  Any problems you or family members have had with anesthetic medicines.  Any blood disorders you have.  Any surgeries you have had.  Any medical conditions you have.  Whether you are pregnant or may be pregnant. What are the risks? Generally, this is a safe procedure. However,  problems may occur, including:  Allergic reactions to medicines.  A blood clot that breaks free and travels to other parts of your body.  The possible return of an abnormal heart rhythm within hours or days after the procedure.  Your heart stopping (cardiac arrest). This is rare. What happens before the procedure? Medicines  Your health care provider may have you start taking:  Blood-thinning medicines (anticoagulants) so your blood does not clot as easily.  Medicines may be  given to help stabilize your heart rate and rhythm.  Ask your health care provider about changing or stopping your regular medicines. This is especially important if you are taking diabetes medicines or blood thinners. General instructions  Plan to have someone take you home from the hospital or clinic.  If you will be going home right after the procedure, plan to have someone with you for 24 hours.  Follow instructions from your health care provider about eating or drinking restrictions. What happens during the procedure?  To lower your risk of infection:  Your health care team will wash or sanitize their hands.  Your skin will be washed with soap.  An IV tube will be inserted into one of your veins.  You will be given a medicine to help you relax (sedative).  Sticky patches (electrodes) or metal paddles may be placed on your chest.  An electrical shock will be delivered. The procedure may vary among health care providers and hospitals. What happens after the procedure?  Your blood pressure, heart rate, breathing rate, and blood oxygen level will be monitored until the medicines you were given have worn off.  Do not drive for 24 hours if you were given a sedative.  Your heart rhythm will be watched to make sure it does not change. This information is not intended to replace advice given to you by your health care provider. Make sure you discuss any questions you have with your health care provider. Document Released: 07/26/2002 Document Revised: 04/03/2016 Document Reviewed: 02/09/2016 Elsevier Interactive Patient Education  2017 [employer].

## 2016-07-03 ENCOUNTER — Other Ambulatory Visit: Payer: Self-pay | Admitting: Cardiology

## 2016-07-03 ENCOUNTER — Encounter (HOSPITAL_COMMUNITY): Payer: Self-pay

## 2016-07-03 ENCOUNTER — Encounter (HOSPITAL_COMMUNITY)
Admission: RE | Admit: 2016-07-03 | Discharge: 2016-07-03 | Disposition: A | Discharge status: Home / Self Care | Payer: Medicare Other | Source: Ambulatory Visit | Attending: Cardiology | Admitting: Cardiology

## 2016-07-03 DIAGNOSIS — Z79899 Other long term (current) drug therapy: Secondary | ICD-10-CM | POA: Diagnosis not present

## 2016-07-03 DIAGNOSIS — I428 Other cardiomyopathies: Secondary | ICD-10-CM | POA: Diagnosis not present

## 2016-07-03 DIAGNOSIS — I272 Pulmonary hypertension, unspecified: Secondary | ICD-10-CM | POA: Diagnosis not present

## 2016-07-03 DIAGNOSIS — E785 Hyperlipidemia, unspecified: Secondary | ICD-10-CM | POA: Diagnosis not present

## 2016-07-03 DIAGNOSIS — I481 Persistent atrial fibrillation: Secondary | ICD-10-CM | POA: Diagnosis not present

## 2016-07-03 DIAGNOSIS — F418 Other specified anxiety disorders: Secondary | ICD-10-CM | POA: Diagnosis not present

## 2016-07-03 DIAGNOSIS — I10 Essential (primary) hypertension: Secondary | ICD-10-CM | POA: Diagnosis not present

## 2016-07-03 DIAGNOSIS — Z7901 Long term (current) use of anticoagulants: Secondary | ICD-10-CM | POA: Diagnosis not present

## 2016-07-03 HISTORY — DX: Cardiac arrhythmia, unspecified: I49.9

## 2016-07-03 LAB — CBC WITH DIFFERENTIAL/PLATELET
Basophils Absolute: 0 10*3/uL (ref 0.0–0.1)
Basophils Relative: 0 %
Eosinophils Absolute: 0.2 10*3/uL (ref 0.0–0.7)
Eosinophils Relative: 3 %
HCT: 42.2 % (ref 36.0–46.0)
Hemoglobin: 13.9 g/dL (ref 12.0–15.0)
Lymphocytes Relative: 25 %
Lymphs Abs: 1.5 10*3/uL (ref 0.7–4.0)
MCH: 31.4 pg (ref 26.0–34.0)
MCHC: 32.9 g/dL (ref 30.0–36.0)
MCV: 95.5 fL (ref 78.0–100.0)
Monocytes Absolute: 0.4 10*3/uL (ref 0.1–1.0)
Monocytes Relative: 6 %
Neutro Abs: 4 10*3/uL (ref 1.7–7.7)
Neutrophils Relative %: 66 %
Platelets: 207 10*3/uL (ref 150–400)
RBC: 4.42 MIL/uL (ref 3.87–5.11)
RDW: 13.9 % (ref 11.5–15.5)
WBC: 6.1 10*3/uL (ref 4.0–10.5)

## 2016-07-03 LAB — BASIC METABOLIC PANEL
Anion gap: 5 (ref 5–15)
BUN: 18 mg/dL (ref 6–20)
CHLORIDE: 108 mmol/L (ref 101–111)
CO2: 29 mmol/L (ref 22–32)
Calcium: 9.8 mg/dL (ref 8.9–10.3)
Creatinine, Ser: 1.18 mg/dL — ABNORMAL HIGH (ref 0.44–1.00)
GFR, EST AFRICAN AMERICAN: 50 mL/min — AB (ref >60)
GFR, EST NON AFRICAN AMERICAN: 43 mL/min — AB (ref >60)
Glucose, Bld: 106 mg/dL — ABNORMAL HIGH (ref 65–99)
POTASSIUM: 4 mmol/L (ref 3.5–5.1)
SODIUM: 142 mmol/L (ref 135–145)

## 2016-07-03 LAB — PROTIME-INR
INR: 1.36
Prothrombin Time: 16.9 seconds — ABNORMAL HIGH (ref 11.4–15.2)

## 2016-07-03 LAB — APTT: aPTT: 41 seconds — ABNORMAL HIGH (ref 24–36)

## 2016-07-03 LAB — TSH: TSH: 2.377 u[IU]/mL (ref 0.350–4.500)

## 2016-07-03 NOTE — Addendum Note (Signed)
Addended by: Rollene Rotunda on: 07/03/2016 03:57 PM   Modules accepted: Orders, SmartSet

## 2016-07-04 ENCOUNTER — Encounter: Payer: Self-pay | Admitting: Cardiology

## 2016-07-05 ENCOUNTER — Encounter (HOSPITAL_COMMUNITY)
Admission: RE | Disposition: A | Discharge status: Home / Self Care | Payer: Self-pay | Source: Ambulatory Visit | Attending: Cardiology

## 2016-07-05 ENCOUNTER — Encounter (HOSPITAL_COMMUNITY): Payer: Self-pay | Admitting: *Deleted

## 2016-07-05 ENCOUNTER — Ambulatory Visit (HOSPITAL_COMMUNITY): Payer: Medicare Other | Admitting: Anesthesiology

## 2016-07-05 ENCOUNTER — Ambulatory Visit (HOSPITAL_COMMUNITY)
Admission: RE | Admit: 2016-07-05 | Discharge: 2016-07-05 | Disposition: A | Discharge status: Home / Self Care | Payer: Medicare Other | Source: Ambulatory Visit | Attending: Cardiology | Admitting: Cardiology

## 2016-07-05 DIAGNOSIS — I10 Essential (primary) hypertension: Secondary | ICD-10-CM | POA: Diagnosis not present

## 2016-07-05 DIAGNOSIS — I272 Pulmonary hypertension, unspecified: Secondary | ICD-10-CM | POA: Diagnosis not present

## 2016-07-05 DIAGNOSIS — Z79899 Other long term (current) drug therapy: Secondary | ICD-10-CM | POA: Diagnosis not present

## 2016-07-05 DIAGNOSIS — I428 Other cardiomyopathies: Secondary | ICD-10-CM | POA: Insufficient documentation

## 2016-07-05 DIAGNOSIS — I481 Persistent atrial fibrillation: Secondary | ICD-10-CM | POA: Diagnosis not present

## 2016-07-05 DIAGNOSIS — I4891 Unspecified atrial fibrillation: Secondary | ICD-10-CM

## 2016-07-05 DIAGNOSIS — Z01818 Encounter for other preprocedural examination: Secondary | ICD-10-CM

## 2016-07-05 DIAGNOSIS — E785 Hyperlipidemia, unspecified: Secondary | ICD-10-CM | POA: Insufficient documentation

## 2016-07-05 DIAGNOSIS — Z7901 Long term (current) use of anticoagulants: Secondary | ICD-10-CM | POA: Diagnosis not present

## 2016-07-05 DIAGNOSIS — F418 Other specified anxiety disorders: Secondary | ICD-10-CM | POA: Diagnosis not present

## 2016-07-05 HISTORY — PX: CARDIOVERSION: SHX1299

## 2016-07-05 SURGERY — CARDIOVERSION
Anesthesia: Monitor Anesthesia Care

## 2016-07-05 MED ORDER — LACTATED RINGERS IV SOLN
INTRAVENOUS | Status: DC
  Administered 2016-07-05: 10:00:00 via INTRAVENOUS

## 2016-07-05 MED ORDER — MIDAZOLAM HCL 2 MG/2ML IJ SOLN
INTRAMUSCULAR | Status: AC
  Filled 2016-07-05: qty 2

## 2016-07-05 MED ORDER — MIDAZOLAM HCL 5 MG/5ML IJ SOLN
INTRAMUSCULAR | Status: DC | PRN
  Administered 2016-07-05: 1 mg via INTRAVENOUS

## 2016-07-05 MED ORDER — PROPOFOL 10 MG/ML IV BOLUS
INTRAVENOUS | Status: DC | PRN
  Administered 2016-07-05: 10 mg via INTRAVENOUS

## 2016-07-05 MED ORDER — FENTANYL CITRATE (PF) 100 MCG/2ML IJ SOLN
INTRAMUSCULAR | Status: DC | PRN
  Administered 2016-07-05: 25 ug via INTRAVENOUS

## 2016-07-05 MED ORDER — SODIUM CHLORIDE 0.9 % IV SOLN: INTRAVENOUS | Status: DC

## 2016-07-05 MED ORDER — PROPOFOL 500 MG/50ML IV EMUL
INTRAVENOUS | Status: DC | PRN
  Administered 2016-07-05: 150 ug/kg/min via INTRAVENOUS

## 2016-07-05 MED ORDER — FENTANYL CITRATE (PF) 100 MCG/2ML IJ SOLN
INTRAMUSCULAR | Status: AC
  Filled 2016-07-05: qty 2

## 2016-07-05 NOTE — CV Procedure (Signed)
Elective direct current cardioversion  Indication: Persistent atrial fibrillation  Description of procedure: Reviewed recent office notes from Dr. Antoine Poche and discussed the procedure with the patient and her husband. Lab work reviewed. Informed consent obtained. Patient taken to the procedure suite where a timeout was performed. Anterior and posterior pads were placed and connected to a biphasic defibrillator. Deep sedation with propofol was provided by the anesthesia service. A single synchronized shock at 120 J was delivered with successful restoration of sinus rhythm, initially with atrial and ventricular ectopy. Rhythm was confirmed by postprocedure ECG. She remained hemodynamically stable throughout without obvious immediate complications.  Jonelle Sidle, M.D., F.A.C.C.

## 2016-07-05 NOTE — Discharge Instructions (Signed)
Electrical Cardioversion, Care After °This sheet gives you information about how to care for yourself after your procedure. Your health care provider may also give you more specific instructions. If you have problems or questions, contact your health care provider. °What can I expect after the procedure? °After the procedure, it is common to have: °· Some redness on the skin where the shocks were given. °Follow these instructions at home: °· Do not drive for 24 hours if you were given a medicine to help you relax (sedative). °· Take over-the-counter and prescription medicines only as told by your health care provider. °· Ask your health care provider how to check your pulse. Check it often. °· Rest for 48 hours after the procedure or as told by your health care provider. °· Avoid or limit your caffeine use as told by your health care provider. °Contact a health care provider if: °· You feel like your heart is beating too quickly or your pulse is not regular. °· You have a serious muscle cramp that does not go away. °Get help right away if: °· You have discomfort in your chest. °· You are dizzy or you feel faint. °· You have trouble breathing or you are short of breath. °· Your speech is slurred. °· You have trouble moving an arm or leg on one side of your body. °· Your fingers or toes turn cold or blue. °This information is not intended to replace advice given to you by your health care provider. Make sure you discuss any questions you have with your health care provider. °Document Released: 05/26/2013 Document Revised: 03/08/2016 Document Reviewed: 02/09/2016 °Elsevier Interactive Patient Education © 2017 [employer] Inc[employer] ° °

## 2016-07-05 NOTE — Transfer of Care (Signed)
Immediate Anesthesia Transfer of Care Note  Patient: Kelly [name]  Procedure(s) Performed: Procedure(s): CARDIOVERSION (N/A)  Patient Location: PACU  Anesthesia Type:MAC  Level of Consciousness: awake  Airway & Oxygen Therapy: Patient Spontanous Breathing  Post-op Assessment: Report given to RN  Post vital signs: Reviewed  Last Vitals:  Vitals:   07/05/16 0805 07/05/16 1045  BP: 111/63 102/64  Pulse: (!) 102 80  Resp: 16 11  Temp: 36.4 C 36.5 C    Last Pain:  Vitals:   07/05/16 0805  TempSrc: Axillary      Patients Stated Pain Goal: 6 (07/05/16 1045)  Complications: No apparent anesthesia complications

## 2016-07-05 NOTE — Interval H&P Note (Signed)
History and Physical Interval Note:  07/05/2016 10:11 AM  Patient presents for elective DCCV as scheduled by Dr. Antoine Poche. Labwork reviewed. Informed consent obtained. She is ready to proceed.  Jonelle Sidle, M.D., F.A.C.C.

## 2016-07-05 NOTE — Anesthesia Preprocedure Evaluation (Signed)
Anesthesia Evaluation  Patient identified by MRN, date of birth, ID band Patient awake    Reviewed: Allergy & Precautions, NPO status , Patient's Chart, lab work & pertinent test results  Airway Mallampati: I  TM Distance: >3 FB     Dental  (+) Teeth Intact   Pulmonary shortness of breath,    breath sounds clear to auscultation       Cardiovascular hypertension, Pt. on medications + dysrhythmias Atrial Fibrillation  Rhythm:Irregular Rate:Normal     Neuro/Psych  Headaches, PSYCHIATRIC DISORDERS Anxiety Depression    GI/Hepatic   Endo/Other    Renal/GU      Musculoskeletal   Abdominal   Peds  Hematology   Anesthesia Other Findings   Reproductive/Obstetrics                             Anesthesia Physical Anesthesia Plan  ASA: III  Anesthesia Plan: MAC   Post-op Pain Management:    Induction: Intravenous  Airway Management Planned: Simple Face Mask  Additional Equipment:   Intra-op Plan:   Post-operative Plan:   Informed Consent: I have reviewed the patients History and Physical, chart, labs and discussed the procedure including the risks, benefits and alternatives for the proposed anesthesia with the patient or authorized representative who has indicated his/her understanding and acceptance.     Plan Discussed with:   Anesthesia Plan Comments:         Anesthesia Quick Evaluation

## 2016-07-05 NOTE — H&P (View-Only) (Signed)
  Cardiology Office Note   Date:  07/01/2016   ID:  Kelly [name], DOB 05/17/1941, MRN [id number]  PCP:  STACKS,WARREN, MD  Cardiologist:   Shammara Jarrett, MD  Referring:  STACKS,WARREN, MD  Chief Complaint  Patient presents with  . Atrial Fibrillation  . Shortness of Breath      History of Present Illness: Kelly [name] is a 75 y.o. female who presents for follow up of NICM.  She has a history of normal coronary arteries by catheterization in 2002. Echocardiogram in 2015 demonstrated normal LV function with mild mitral regurgitation. Myoview in 2013 demonstrated normal perfusion.  She saw Scott Weaver PA C in July.  At that time she was not particularly complaining of shortness of breath. She said she did walk 600 yards without being short of breath but when I saw her recently she was SOB walking 50 yards.   At that appt she was found to be in atrial fib.  Echo demonstrated an EF of 35%.  She has moderate MR.  Today I reviewed the Holter which demonstrated atrial fib with rapid rate.   She has not tolerated the anticoagulation.  She is as SOB as she was before or perhaps slightly less.  She is complaining of back pain.  The patient denies any new symptoms such as chest discomfort, neck or arm discomfort. There has been no new PND or orthopnea. There have been no reported palpitations, presyncope or syncope.  Past Medical History:  Diagnosis Date  . Depression with anxiety   . HLD (hyperlipidemia)   . HTN (hypertension)   . Migraine   . NICM (nonischemic cardiomyopathy) (HCC)    a. LHC 8/02: EF 55%, 2+ MR, normal cors;  b.  Echo 3/10: EF 50-55%, mild diast dysfn, mild MVP (ant leaflet), mild MR, mild TR, small pericard eff.;   c.  Myoview 3/10:  EF 69%, no ischemia;  d. Echo 9/13: EF 50-55%, Gr 1 diast dysfn, mild prolapse of ant MV leaflet;   e. Myoview 9/13: EF 64%, NL perfusion;  f.  Echo (11/15):  EF 50-55%, normal diastolic function, mild MR  . Pulmonary HTN     Past  Surgical History:  Procedure Laterality Date  . ABDOMINAL HYSTERECTOMY    . APPENDECTOMY    . CHOLECYSTECTOMY    . EYE SURGERY    . IR GENERIC HISTORICAL  05/13/2016   IR RADIOLOGY PERIPHERAL GUIDED IV START 05/13/2016 Heath McCullough, MD [facility]  . IR GENERIC HISTORICAL  05/13/2016   IR US GUIDE VASC ACCESS RIGHT 05/13/2016 Heath McCullough, MD [facility]     Current Outpatient Prescriptions  Medication Sig Dispense Refill  . apixaban (ELIQUIS) 5 MG TABS tablet Take 1 tablet (5 mg total) by mouth 2 (two) times daily. 28 tablet 0  . cetirizine (ZYRTEC) 10 MG tablet Take 10 mg by mouth daily as needed for allergies.    . cholecalciferol (VITAMIN D) 1000 UNITS tablet Take 1,000 Units by mouth daily.    . clonazePAM (KLONOPIN) 0.5 MG tablet Take 0.5 mg by mouth daily as needed for anxiety.    . collagenase (SANTYL) ointment Apply 1 application topically daily. 15 g 0  . diptheria-tetanus toxoids (DECAVAC) 2-2 LF/0.5ML injection     . fluocinonide-emollient (LIDEX-E) 0.05 % cream Apply 1 application topically 2 (two) times daily. To affected areas 60 g 5  . furosemide (LASIX) 40 MG tablet Take 1 tablet (40 mg total) by mouth daily. 30 tablet 9  .   hydrOXYzine (ATARAX/VISTARIL) 25 MG tablet Take 1 tablet (25 mg total) by mouth every 6 (six) hours as needed for itching. 30 tablet 5  . meclizine (ANTIVERT) 12.5 MG tablet TAKE 1 TABLET BY MOUTH EVERY 12 TO 24 HOURS AS NEEDED 40 tablet 0  . metoprolol succinate (TOPROL XL) 25 MG 24 hr tablet Take 1 tablet (25 mg total) by mouth daily. 30 tablet 6  . pravastatin (PRAVACHOL) 40 MG tablet Take 1 tablet (40 mg total) by mouth daily. 30 tablet 3  . sildenafil (REVATIO) 20 MG tablet Take 1 tablet (20 mg total) by mouth 3 (three) times daily. For breathing difficulty from high blood pressure in the lungs 100 tablet 5   No current facility-administered medications for this visit.     Allergies:   Codeine and Simvastatin    ROS:  Please see the  history of present illness.   Otherwise, review of systems are positive for none.   All other systems are reviewed and negative.    PHYSICAL EXAM: VS:  BP 100/78 (BP Location: Right Arm, Patient Position: Sitting, Cuff Size: Normal)   Pulse 70   Ht 5' 2" (1.575 m)   Wt 147 lb 3.2 oz (66.8 kg)   BMI 26.92 kg/m  , BMI Body mass index is 26.92 kg/m. GENERAL:  Well appearing HEENT:  Pupils equal round and reactive, fundi not visualized, oral mucosa unremarkable NECK:  Positive jugular venous distention, waveform within normal limits, carotid upstroke brisk and symmetric, no bruits, no thyromegaly LYMPHATICS:  No cervical, inguinal adenopathy LUNGS:  Clear to auscultation bilaterally BACK:  No CVA tenderness CHEST:  Unremarkable HEART:  PMI not displaced or sustained,S1 and S2 within normal limits, no S3,  no clicks, no rubs, irregular murmurs ABD:  Flat, positive bowel sounds normal in frequency in pitch, no bruits, no rebound, no guarding, no midline pulsatile mass, no hepatomegaly, no splenomegaly EXT:  2 plus pulses throughout, no edema, no cyanosis no clubbing   EKG:  EKG is not ordered today.    Recent Labs: 05/10/2016: ALT 22; BUN 21; Creatinine, Ser 1.13; Platelets 212; Potassium 5.4; Sodium 147    Lipid Panel    Component Value Date/Time   CHOL 236 (H) 02/16/2015 1209   TRIG 120 02/16/2015 1209   HDL 53 02/16/2015 1209   CHOLHDL 4.5 (H) 02/16/2015 1209   CHOLHDL 3 10/22/2010 0829   VLDL 14.6 10/22/2010 0829   LDLCALC 159 (H) 02/16/2015 1209   LDLDIRECT 151.3 08/16/2010 1118      Wt Readings from Last 3 Encounters:  07/01/16 147 lb 3.2 oz (66.8 kg)  06/18/16 148 lb (67.1 kg)  06/14/16 148 lb 2 oz (67.2 kg)      Other studies Reviewed: Additional studies/ records that were reviewed today include: Holter. Review of the above records demonstrates:  See below   ASSESSMENT AND PLAN:  HTN:   The blood pressure is at target. No change in medications is indicated.  We will continue with therapeutic lifestyle changes (TLC).  CM:  She has had a moderately reduced ejection fraction.  I will follow up after the cardioversion.  I asked her to increase her Lasix to 40 mg daily.    ATRIAL FIB:    [name] has a CHA2DS2 - VASc score of 4 with a risk of stroke of 4%.   She was started on Eliquis.    I have scheduled cardioversion.    PULM HTN:  This was mild.  No   change in therapy is planned.   Current medicines are reviewed at length with the patient today.  The patient does not have concerns regarding medicines.  The following changes have been made:  As above.   Labs/ tests ordered today include:   Orders Placed This Encounter  Procedures  . ELECTRICAL CARDIOVERSION  . CBC  . Basic Metabolic Panel (BMET)  . INR/PT  . APTT     Disposition:   FU with or APP in two weeks in NL office.     Signed, Joshuan Bolander, MD  07/01/2016 8:20 AM    [facility]   

## 2016-07-05 NOTE — Anesthesia Postprocedure Evaluation (Signed)
Anesthesia Post Note  Patient: Kelly [name]  Procedure(s) Performed: Procedure(s) (LRB): CARDIOVERSION (N/A)  Patient location during evaluation: PACU Anesthesia Type: MAC Level of consciousness: awake and alert Pain management: pain level controlled Vital Signs Assessment: post-procedure vital signs reviewed and stable Respiratory status: spontaneous breathing Cardiovascular status: blood pressure returned to baseline Postop Assessment: no signs of nausea or vomiting Anesthetic complications: no    Last Vitals:  Vitals:   07/05/16 0805 07/05/16 1045  BP: 111/63 102/64  Pulse: (!) 102 80  Resp: 16 11  Temp: 36.4 C 36.5 C    Last Pain:  Vitals:   07/05/16 0805  TempSrc: Axillary                 Ileigh Mettler

## 2016-07-05 NOTE — Progress Notes (Signed)
Electrical Cardioversion Procedure Note FOSTER STOGSDILL [id number] [birth year]  Procedure: Electrical Cardioversion Indications:  Atrial Fibrillation Procedure Details Consent: yes Time Out: Verified patient identification, verified procedure, site/side was marked, verified correct patient position, special equipment/implants available, medications/allergies/relevent history reviewed, required imaging and test results available.  Time out 10:11 am Patient placed on cardiac monitor, pulse oximetry, supplemental oxygen as necessary.  Sedation given:yes Pacer pads placed yes Cardioverted 1 time(s).  Cardioverted at 120 joules Evaluation Findings: Post procedure EKG shows:  Normal sinus rhythm Complications: none Patient did tolerate procedure well.   Daleen Squibb 07/05/2016, 10:56 AM

## 2016-07-08 ENCOUNTER — Encounter (HOSPITAL_COMMUNITY): Payer: Self-pay | Admitting: Cardiology

## 2016-07-08 ENCOUNTER — Telehealth: Payer: Self-pay | Admitting: *Deleted

## 2016-07-08 NOTE — Telephone Encounter (Signed)
Is it ok to refill this medication for this pt?

## 2016-07-08 NOTE — Telephone Encounter (Signed)
Sildenafil was send into pt pharmacy

## 2016-07-08 NOTE — Telephone Encounter (Signed)
Patients husband called and requested that the sildenafil be refilled by Dr Antoine Poche. He stated that per [facility] drug this rx will be cheaper if it is refilled by a cardiologist.

## 2016-07-08 NOTE — Telephone Encounter (Signed)
Yes as it is ordered already.

## 2016-07-08 NOTE — Telephone Encounter (Signed)
Sadenafil was called into pt pharmacy

## 2016-07-19 DIAGNOSIS — R079 Chest pain, unspecified: Secondary | ICD-10-CM | POA: Diagnosis not present

## 2016-07-19 DIAGNOSIS — R061 Stridor: Secondary | ICD-10-CM | POA: Diagnosis not present

## 2016-07-24 ENCOUNTER — Ambulatory Visit: Payer: Medicare Other | Admitting: Cardiology

## 2016-08-01 NOTE — Progress Notes (Signed)
Cardiology Office Note   Date:  08/03/2016   ID:  Kelly [name], DOB [birth year], MRN [id number]  PCP:  Kelly Claude, MD  Cardiologist:   Kelly Rotunda, MD  Referring:  Kelly Claude, MD  Chief Complaint  Patient presents with  . Atrial Fibrillation     History of Present Illness: Kelly [name] is a 75 y.o. female who presents for follow up of NICM.  She has a history of normal coronary arteries by catheterization in 2002. Echocardiogram in 2015 demonstrated normal LV function with mild mitral regurgitation. Myoview in 2013 demonstrated normal perfusion.  Echo earlier this year demonstrated an EF of 35%.  She has moderate MR.  when I last saw her she had a Holter which demonstrated atrial fib with rapid rate. She was cardioverted last month.  She returns for follow up.  She feels much improved. Her dyspnea is improved. She's not    describing palpitations, presyncope or syncope. She's had no chest pressure, neck or arm discomfort. She's had no problems with the blood thinner.   Past Medical History:  Diagnosis Date  . Depression with anxiety   . Dysrhythmia    AFib  . HLD (hyperlipidemia)   . HTN (hypertension)   . Migraine   . NICM (nonischemic cardiomyopathy) (HCC)    a. LHC 8/02: EF 55%, 2+ MR, normal cors;  b.  Echo 3/10: EF 50-55%, mild diast dysfn, mild MVP (ant leaflet), mild MR, mild TR, small pericard eff.;   c.  Myoview 3/10:  EF 69%, no ischemia;  d. Echo 9/13: EF 50-55%, Gr 1 diast dysfn, mild prolapse of ant MV leaflet;   e. Myoview 9/13: EF 64%, NL perfusion;  f.  Echo (11/15):  EF 50-55%, normal diastolic function, mild MR  . Pulmonary HTN     Past Surgical History:  Procedure Laterality Date  . ABDOMINAL HYSTERECTOMY    . APPENDECTOMY    . CARDIOVERSION N/A 07/05/2016   Procedure: CARDIOVERSION;  Surgeon: Jonelle Sidle, MD;  Location: [facility];  Service: Cardiovascular;  Laterality: N/A;  . CHOLECYSTECTOMY    . EYE SURGERY Bilateral    cataract  removal with lens implants  . IR GENERIC HISTORICAL  05/13/2016   IR RADIOLOGY PERIPHERAL GUIDED IV START 05/13/2016 Kelly Moan, MD [facility]  . IR GENERIC HISTORICAL  05/13/2016   IR US GUIDE VASC ACCESS RIGHT 05/13/2016 Kelly Moan, MD [facility]     Current Outpatient Prescriptions  Medication Sig Dispense Refill  . apixaban (ELIQUIS) 5 MG TABS tablet Take 1 tablet (5 mg total) by mouth 2 (two) times daily. 28 tablet 0  . cetirizine (ZYRTEC) 10 MG tablet Take 10 mg by mouth daily as needed for allergies.    . cholecalciferol (VITAMIN D) 1000 UNITS tablet Take 1,000 Units by mouth daily.    . clonazePAM (KLONOPIN) 0.5 MG tablet Take 0.5 mg by mouth daily as needed for anxiety.    . furosemide (LASIX) 40 MG tablet Take 1 tablet (40 mg total) by mouth daily. 30 tablet 9  . hydrOXYzine (ATARAX/VISTARIL) 25 MG tablet Take 1 tablet (25 mg total) by mouth every 6 (six) hours as needed for itching. 30 tablet 5  . meclizine (ANTIVERT) 12.5 MG tablet TAKE 1 TABLET BY MOUTH EVERY 12 TO 24 HOURS AS NEEDED (Patient taking differently: TAKE 1 TABLET BY MOUTH EVERY 12 TO 24 HOURS AS NEEDED FOR VERTIGO) 40 tablet 0  . metoprolol succinate (TOPROL XL) 25 MG 24 hr tablet  Take 1 tablet (25 mg total) by mouth daily. 30 tablet 6  . naphazoline-pheniramine (NAPHCON-A) 0.025-0.3 % ophthalmic solution Place 1 drop into both eyes 4 (four) times daily as needed for irritation or allergies.    . pravastatin (PRAVACHOL) 40 MG tablet Take 1 tablet (40 mg total) by mouth daily. 30 tablet 3  . sildenafil (REVATIO) 20 MG tablet Take 1 tablet (20 mg total) by mouth 3 (three) times daily. For breathing difficulty from high blood pressure in the lungs 270 tablet 3   No current facility-administered medications for this visit.     Allergies:   Codeine and Simvastatin    ROS:  Please see the history of present illness.   Otherwise, review of systems are positive for none.   All other systems are reviewed and  negative.    PHYSICAL EXAM: VS:  BP 118/76 (BP Location: Right Arm)   Pulse 77   Ht 5\' 2"  (1.575 m)   Wt 143 lb 3.2 oz (65 kg)   BMI 26.19 kg/m  , BMI Body mass index is 26.19 kg/m. GENERAL:  Well appearing HEENT:  Pupils equal round and reactive, fundi not visualized, oral mucosa unremarkable NECK:  Positive jugular venous distention, waveform within normal limits, carotid upstroke brisk and symmetric, no bruits, no thyromegaly LYMPHATICS:  No cervical, inguinal adenopathy LUNGS:  Clear to auscultation bilaterally BACK:  No CVA tenderness CHEST:  Unremarkable HEART:  PMI not displaced or sustained,S1 and S2 within normal limits, no S3,  no clicks, no rubs, irregular murmurs ABD:  Flat, positive bowel sounds normal in frequency in pitch, no bruits, no rebound, no guarding, no midline pulsatile mass, no hepatomegaly, no splenomegaly EXT:  2 plus pulses throughout, no edema, no cyanosis no clubbing   EKG:  EKG is  ordered today. Sinus rhythm, rate 77, axis within normal limits, intervals within normal limits, premature ectopic complexes, poor anterior R wave progression, low voltage, nonspecific diffuse T wave flattening, QTC prolonged.   Recent Labs: 05/10/2016: ALT 22 07/03/2016: BUN 18; Creatinine, Ser 1.18; Hemoglobin 13.9; Platelets 207; Potassium 4.0; Sodium 142; TSH 2.377    Lipid Panel    Component Value Date/Time   CHOL 236 (H) 02/16/2015 1209   TRIG 120 02/16/2015 1209   HDL 53 02/16/2015 1209   CHOLHDL 4.5 (H) 02/16/2015 1209   CHOLHDL 3 10/22/2010 0829   VLDL 14.6 10/22/2010 0829   LDLCALC 159 (H) 02/16/2015 1209   LDLDIRECT 151.3 08/16/2010 1118      Wt Readings from Last 3 Encounters:  08/02/16 143 lb 3.2 oz (65 kg)  07/03/16 146 lb (66.2 kg)  07/01/16 147 lb 3.2 oz (66.8 kg)      Other studies Reviewed: Additional studies/ records that were reviewed today include:  None Review of the above records demonstrates:     ASSESSMENT AND PLAN:  HTN:    The blood pressure is at target. No change in medications is indicated. We will continue with therapeutic lifestyle changes (TLC).    CM:  She has had a moderately reduced ejection fraction.  I will follow up in six months with another echo .  ATRIAL FIB:    Ms. Moses MannersSally F Samaras has a CHA2DS2 - VASc score of 4 with a risk of stroke of 4%.   She is status post cardioversion.  She will continue the meds as listed.    PULM HTN:  She has been started on Revatio.  I will continue this.   Current medicines are  reviewed at length with the patient today.  The patient does not have concerns regarding medicines.  The following changes have been made:  None  Labs/ tests ordered today include:     Orders Placed This Encounter  Procedures  . EKG 12-Lead  . ECHOCARDIOGRAM COMPLETE     Disposition:   FU with me after echo in six months.     Signed, Kelly Rotunda, MD  08/03/2016 8:51 AM    [facility]

## 2016-08-02 ENCOUNTER — Encounter: Payer: Self-pay | Admitting: Cardiology

## 2016-08-02 ENCOUNTER — Ambulatory Visit (INDEPENDENT_AMBULATORY_CARE_PROVIDER_SITE_OTHER): Payer: Medicare Other | Admitting: Cardiology

## 2016-08-02 VITALS — BP 118/76 | HR 77 | Ht 62.0 in | Wt 143.2 lb

## 2016-08-02 DIAGNOSIS — I481 Persistent atrial fibrillation: Secondary | ICD-10-CM | POA: Diagnosis not present

## 2016-08-02 DIAGNOSIS — I429 Cardiomyopathy, unspecified: Secondary | ICD-10-CM

## 2016-08-02 DIAGNOSIS — I4819 Other persistent atrial fibrillation: Secondary | ICD-10-CM

## 2016-08-02 MED ORDER — SILDENAFIL CITRATE 20 MG PO TABS: 20.0000 mg | ORAL_TABLET | Freq: Three times a day (TID) | ORAL | 3 refills | Status: DC

## 2016-08-02 NOTE — Patient Instructions (Signed)
Medication Instructions:  Continue current medications  Labwork: None ordered  Testing/Procedures: Your physician has requested that you have an echocardiogram in 6 Months. Echocardiography is a painless test that uses sound waves to create images of your heart. It provides your doctor with information about the size and shape of your heart and how well your heart's chambers and valves are working. This procedure takes approximately one hour. There are no restrictions for this procedure.  Follow-Up: Your physician wants you to follow-up in: 6 Months. You will receive a reminder letter in the mail two months in advance. If you don't receive a letter, please call our office to schedule the follow-up appointment.   Any Other Special Instructions Will Be Listed Below (If Applicable).     If you need a refill on your cardiac medications before your next appointment, please call your pharmacy.

## 2016-08-03 ENCOUNTER — Encounter: Payer: Self-pay | Admitting: Cardiology

## 2016-08-05 ENCOUNTER — Telehealth: Payer: Self-pay | Admitting: *Deleted

## 2016-08-05 NOTE — Telephone Encounter (Signed)
PA send into [facility]

## 2016-08-05 NOTE — Telephone Encounter (Signed)
called to see if the PA on this medication for his wife has been taking care of, please look into and call pt with an update.

## 2016-08-05 NOTE — Telephone Encounter (Signed)
Returned call to patient's husband Kelly [name] - needed to know which med needed a PA  Per husband - sildenafil needed prior authorization (pulmonary hypertension)  [facility] [place] faxed notification per husband  Message routed to [place]

## 2016-08-06 ENCOUNTER — Telehealth: Payer: Self-pay | Admitting: Cardiology

## 2016-08-06 NOTE — Telephone Encounter (Signed)
Kelly [name] is calling to check the status of a provider Query on Dec 18. BVA:[id number]

## 2016-08-07 ENCOUNTER — Other Ambulatory Visit: Payer: Self-pay

## 2016-08-07 NOTE — Telephone Encounter (Signed)
Message routed to [facility] for provider query paperwork

## 2016-08-08 ENCOUNTER — Telehealth: Payer: Self-pay | Admitting: Cardiology

## 2016-08-08 NOTE — Telephone Encounter (Signed)
New Message  Synetta Fail from [facility] call requesting to speak with RN about pre authorization for pt medication. Please call back to discuss

## 2016-08-09 ENCOUNTER — Telehealth: Payer: Self-pay | Admitting: Cardiology

## 2016-08-09 DIAGNOSIS — I272 Pulmonary hypertension, unspecified: Secondary | ICD-10-CM

## 2016-08-09 MED ORDER — SILDENAFIL CITRATE 20 MG PO TABS: 20.0000 mg | ORAL_TABLET | Freq: Three times a day (TID) | ORAL | 3 refills | Status: DC

## 2016-08-09 NOTE — Telephone Encounter (Signed)
Pt husband notified of [facility]  rx sent for 90 days as requested

## 2016-08-09 NOTE — Telephone Encounter (Signed)
Pt's husband wants [place] to contact [facility] for Sildenafil for pre-auth-said pharmacy has sent two faxes requesting this so he just wanted to see if he can get the ball rolling to get this done-pls call when done-husband Richard at [phone]---I received a message from Kelly [name] to resend this message under pt's name, this is the patient-her husband Kelly [name] was calling for her and gave her name and DOB as the patient.

## 2016-08-09 NOTE — Telephone Encounter (Addendum)
Pt's husband wants [place] to contact [facility] for Sildenafil for pre-auth-said pharmacy has sent two faxes requesting this so he just wanted to see if he can get the ball rolling to get this done-pls call when done-husband Richard at [phone]

## 2016-08-09 NOTE — Telephone Encounter (Signed)
Medication was denied by pt insurance, pt was told to stop medication by Dr Antoine Poche, because she will not be able to afford it out of pocket

## 2016-08-09 NOTE — Telephone Encounter (Signed)
Medication was denied by pt insurance, pt was told to stop medication by Dr Hochrein, because she will not be able to afford it out of pocket 

## 2016-08-24 ENCOUNTER — Observation Stay (HOSPITAL_COMMUNITY)
Admission: EM | Admit: 2016-08-24 | Discharge: 2016-08-24 | Disposition: A | Discharge status: Home / Self Care | Payer: Medicare Other | Attending: Family Medicine | Admitting: Family Medicine

## 2016-08-24 ENCOUNTER — Encounter (HOSPITAL_COMMUNITY): Payer: Self-pay

## 2016-08-24 ENCOUNTER — Emergency Department (HOSPITAL_COMMUNITY): Payer: Medicare Other

## 2016-08-24 DIAGNOSIS — I700 Atherosclerosis of aorta: Secondary | ICD-10-CM | POA: Diagnosis present

## 2016-08-24 DIAGNOSIS — I4891 Unspecified atrial fibrillation: Secondary | ICD-10-CM | POA: Diagnosis present

## 2016-08-24 DIAGNOSIS — Z888 Allergy status to other drugs, medicaments and biological substances status: Secondary | ICD-10-CM | POA: Diagnosis not present

## 2016-08-24 DIAGNOSIS — R079 Chest pain, unspecified: Secondary | ICD-10-CM | POA: Diagnosis not present

## 2016-08-24 DIAGNOSIS — I481 Persistent atrial fibrillation: Secondary | ICD-10-CM | POA: Insufficient documentation

## 2016-08-24 DIAGNOSIS — F418 Other specified anxiety disorders: Secondary | ICD-10-CM | POA: Insufficient documentation

## 2016-08-24 DIAGNOSIS — I272 Pulmonary hypertension, unspecified: Secondary | ICD-10-CM | POA: Insufficient documentation

## 2016-08-24 DIAGNOSIS — I428 Other cardiomyopathies: Secondary | ICD-10-CM

## 2016-08-24 DIAGNOSIS — I10 Essential (primary) hypertension: Secondary | ICD-10-CM | POA: Diagnosis not present

## 2016-08-24 DIAGNOSIS — Z7901 Long term (current) use of anticoagulants: Secondary | ICD-10-CM | POA: Diagnosis not present

## 2016-08-24 DIAGNOSIS — L989 Disorder of the skin and subcutaneous tissue, unspecified: Secondary | ICD-10-CM | POA: Insufficient documentation

## 2016-08-24 DIAGNOSIS — I4581 Long QT syndrome: Secondary | ICD-10-CM | POA: Diagnosis not present

## 2016-08-24 DIAGNOSIS — E7800 Pure hypercholesterolemia, unspecified: Secondary | ICD-10-CM | POA: Diagnosis present

## 2016-08-24 DIAGNOSIS — Z79899 Other long term (current) drug therapy: Secondary | ICD-10-CM | POA: Diagnosis not present

## 2016-08-24 DIAGNOSIS — G43909 Migraine, unspecified, not intractable, without status migrainosus: Secondary | ICD-10-CM | POA: Diagnosis not present

## 2016-08-24 LAB — CBC
HCT: 46.5 % — ABNORMAL HIGH (ref 36.0–46.0)
HCT: 45.5 % (ref 36.0–46.0)
HEMOGLOBIN: 15.3 g/dL — AB (ref 12.0–15.0)
HEMOGLOBIN: 14.9 g/dL (ref 12.0–15.0)
MCH: 30.2 pg (ref 26.0–34.0)
MCH: 30 pg (ref 26.0–34.0)
MCHC: 32.9 g/dL (ref 30.0–36.0)
MCHC: 32.7 g/dL (ref 30.0–36.0)
MCV: 91.9 fL (ref 78.0–100.0)
MCV: 91.5 fL (ref 78.0–100.0)
Platelets: 230 10*3/uL (ref 150–400)
Platelets: 191 10*3/uL (ref 150–400)
RBC: 5.06 MIL/uL (ref 3.87–5.11)
RBC: 4.97 MIL/uL (ref 3.87–5.11)
RDW: 13.1 % (ref 11.5–15.5)
RDW: 13 % (ref 11.5–15.5)
WBC: 6.3 10*3/uL (ref 4.0–10.5)
WBC: 5.7 10*3/uL (ref 4.0–10.5)

## 2016-08-24 LAB — BASIC METABOLIC PANEL
ANION GAP: 11 (ref 5–15)
BUN: 18 mg/dL (ref 6–20)
CALCIUM: 9.5 mg/dL (ref 8.9–10.3)
CO2: 28 mmol/L (ref 22–32)
Chloride: 102 mmol/L (ref 101–111)
Creatinine, Ser: 0.8 mg/dL (ref 0.44–1.00)
Glucose, Bld: 98 mg/dL (ref 65–99)
Potassium: 3.5 mmol/L (ref 3.5–5.1)
Sodium: 141 mmol/L (ref 135–145)

## 2016-08-24 LAB — I-STAT TROPONIN, ED: TROPONIN I, POC: 0 ng/mL (ref 0.00–0.08)

## 2016-08-24 LAB — HEPATIC FUNCTION PANEL
ALBUMIN: 4.5 g/dL (ref 3.5–5.0)
ALT: 18 U/L (ref 14–54)
AST: 29 U/L (ref 15–41)
Alkaline Phosphatase: 63 U/L (ref 38–126)
BILIRUBIN DIRECT: 0.3 mg/dL (ref 0.1–0.5)
Indirect Bilirubin: 1.2 mg/dL — ABNORMAL HIGH (ref 0.3–0.9)
Total Bilirubin: 1.5 mg/dL — ABNORMAL HIGH (ref 0.3–1.2)
Total Protein: 7.9 g/dL (ref 6.5–8.1)

## 2016-08-24 LAB — CREATININE, SERUM
CREATININE: 0.83 mg/dL (ref 0.44–1.00)
GFR calc Af Amer: >60 mL/min (ref >60)

## 2016-08-24 LAB — LIPASE, BLOOD: LIPASE: 31 U/L (ref 11–51)

## 2016-08-24 LAB — TROPONIN I
Troponin I: <0.03 ng/mL (ref <0.03)
Troponin I: <0.03 ng/mL (ref <0.03)

## 2016-08-24 LAB — MRSA PCR SCREENING: MRSA by PCR: NEGATIVE

## 2016-08-24 MED ORDER — METOPROLOL SUCCINATE ER 25 MG PO TB24: 25.0000 mg | ORAL_TABLET | Freq: Every day | ORAL | Status: DC

## 2016-08-24 MED ORDER — ACETAMINOPHEN 325 MG PO TABS: 650.0000 mg | ORAL_TABLET | ORAL | Status: DC | PRN

## 2016-08-24 MED ORDER — CLONAZEPAM 0.5 MG PO TABS: 0.5000 mg | ORAL_TABLET | Freq: Two times a day (BID) | ORAL | Status: DC | PRN

## 2016-08-24 MED ORDER — ONDANSETRON HCL 4 MG/2ML IJ SOLN: 4.0000 mg | Freq: Four times a day (QID) | INTRAMUSCULAR | Status: DC | PRN

## 2016-08-24 MED ORDER — FUROSEMIDE 40 MG PO TABS: 40.0000 mg | ORAL_TABLET | Freq: Every day | ORAL | Status: DC

## 2016-08-24 MED ORDER — GI COCKTAIL ~~LOC~~
30.0000 mL | Freq: Four times a day (QID) | ORAL | Status: DC | PRN
  Filled 2016-08-24: qty 30

## 2016-08-24 MED ORDER — SILDENAFIL CITRATE 20 MG PO TABS
20.0000 mg | ORAL_TABLET | Freq: Three times a day (TID) | ORAL | Status: DC
  Filled 2016-08-24 (×7): qty 1

## 2016-08-24 MED ORDER — HYDROXYZINE HCL 25 MG PO TABS: 25.0000 mg | ORAL_TABLET | Freq: Four times a day (QID) | ORAL | Status: DC | PRN

## 2016-08-24 MED ORDER — HEPARIN SODIUM (PORCINE) 5000 UNIT/ML IJ SOLN: 5000.0000 [IU] | Freq: Three times a day (TID) | INTRAMUSCULAR | Status: DC

## 2016-08-24 MED ORDER — ASPIRIN 81 MG PO CHEW
324.0000 mg | CHEWABLE_TABLET | Freq: Once | ORAL | Status: AC
  Administered 2016-08-24: 324 mg via ORAL
  Filled 2016-08-24: qty 4

## 2016-08-24 MED ORDER — APIXABAN 5 MG PO TABS: 5.0000 mg | ORAL_TABLET | Freq: Two times a day (BID) | ORAL | Status: DC

## 2016-08-24 MED ORDER — PRAVASTATIN SODIUM 40 MG PO TABS: 40.0000 mg | ORAL_TABLET | Freq: Every day | ORAL | Status: DC

## 2016-08-24 MED ORDER — NAPHAZOLINE-PHENIRAMINE 0.025-0.3 % OP SOLN
1.0000 [drp] | Freq: Four times a day (QID) | OPHTHALMIC | Status: DC | PRN
  Filled 2016-08-24: qty 5

## 2016-08-24 NOTE — Progress Notes (Signed)
Patient discharged. All discharge education complete. Patient verbalized understanding. IV removed. Patient taken downstairs via wheelchair for home discharge.

## 2016-08-24 NOTE — ED Triage Notes (Signed)
Pt awoke this am with chest tightness and sob

## 2016-08-24 NOTE — Discharge Summary (Signed)
Physician Discharge Summary  Kelly [name]:[id number] DOB: Oct 15, 1940 DOA: 08/24/2016  PCP: Mechele Claude, MD  Admit date: 08/24/2016 Discharge date: 08/24/2016  Admitted From: Home  Disposition:  Home   Recommendations for Outpatient Follow-up:  1. Follow up with PCP in 1 weeks 2. Follow up with Dr. Antoine Poche in 1-2 weeks  Discharge Condition: STABLE   CODE STATUS: FULL   Brief/Interim Summary: HPI: Kelly [name] is a 76 y.o. female presented to ED with brief episode of central chest pressure that woke her from sleep around 4 AM it has been constant but improving. She has been under a lot of stress recently with familyl matters and having a lot of anxiety.  She reports that she took her medications at that time and felt better.  The pressure/pain radiates to her upper back and shoulders. It is associated with some shortness of breath. Denies nausea denies diaphoresis denies near-syncope or dizziness. She had a similar episode of pain yesterday that lasted a few minutes and has since resolved. She states that occasionally over the past several weeks. Patient has a history of nonischemic cardiomyopathy, atrial fibrillation on Eliquis, pulmonary hypertension on sildenifil, hyperlipidemia she did not take anything at home for the pain other than her usual medications. It has completely resolved.  She wants to sign out of hospital and go home.  I was able to convince her to stay to have her troponin tested.    Chest Pain - symptoms completely resolved now, suspect her symptoms were related to anxiety but given her cardiac history would cycle troponin.  First troponin has been negative, monitor on telemetry, check 2 more sets and plan to discharge later if negative.  Pt threatening to leave AMA this morning.  I was able to convince her to stay and have full 3 sets of troponin and she likely could go home later if negative.  She says that she would follow up with Dr. Antoine Poche her cardiologist.  She  did have 3 negative troponins and was felt safe to discharge home with follow up.    DVT Prophylaxis: apixaban Code Status: full   Family Communication: husband  Disposition Plan: home   Discharge Diagnoses:  Active Problems:   Pure hypercholesterolemia   NICM (nonischemic cardiomyopathy) (HCC)   Aortic atherosclerosis (HCC)   Persistent atrial fibrillation (HCC)   Chest pain   Chest pain at rest  Discharge Instructions   Allergies as of 08/24/2016      Reactions   Codeine Nausea And Vomiting   Simvastatin Cough   Made her cough      Medication List    TAKE these medications   apixaban 5 MG Tabs tablet Commonly known as:  ELIQUIS Take 1 tablet (5 mg total) by mouth 2 (two) times daily.   clonazePAM 0.5 MG tablet Commonly known as:  KLONOPIN Take 0.5 mg by mouth daily as needed for anxiety.   furosemide 40 MG tablet Commonly known as:  LASIX Take 1 tablet (40 mg total) by mouth daily.   hydrOXYzine 25 MG tablet Commonly known as:  ATARAX/VISTARIL Take 1 tablet (25 mg total) by mouth every 6 (six) hours as needed for itching.   meclizine 12.5 MG tablet Commonly known as:  ANTIVERT TAKE 1 TABLET BY MOUTH EVERY 12 TO 24 HOURS AS NEEDED What changed:  See the new instructions.   metoprolol succinate 25 MG 24 hr tablet Commonly known as:  TOPROL XL Take 1 tablet (25 mg total) by mouth daily.  naphazoline-pheniramine 0.025-0.3 % ophthalmic solution Commonly known as:  NAPHCON-A Place 1 drop into both eyes 4 (four) times daily as needed for irritation or allergies.   sildenafil 20 MG tablet Commonly known as:  REVATIO Take 1 tablet (20 mg total) by mouth 3 (three) times daily. For breathing difficulty from high blood pressure in the lungs      Follow-up Information    STACKS,WARREN, MD. Schedule an appointment as soon as possible for a visit in 1 week(s).   Specialty:  Family Medicine Why:  Hospital Follow Up  Contact information: [address] [place]  [place] [address] [phone]        Rollene Rotunda, MD. Schedule an appointment as soon as possible for a visit in 2 week(s).   Specialty:  Cardiology Why:  Hospital Follow Up Contact information: [address] [place] [address] [phone]          Allergies  Allergen Reactions  . Codeine Nausea And Vomiting  . Simvastatin Cough    Made her cough    Procedures/Studies: Dg Chest 2 View  Result Date: 08/24/2016 CLINICAL DATA:  Awakened this morning by chest pain and dyspnea EXAM: CHEST  2 VIEW COMPARISON:  05/10/2016 FINDINGS: Moderate cardiomegaly, unchanged. The lungs are clear. The pulmonary vasculature is normal. There is no pleural effusion. Hilar and mediastinal contours are unremarkable and unchanged. IMPRESSION: Stable cardiomegaly. No consolidation or effusion. Normal vasculature. Electronically Signed   By: Ellery Plunk M.D.   On: 08/24/2016 06:50   Discharge Exam: Vitals:   08/24/16 0825 08/24/16 1123  BP: 101/72   Pulse: 64   Resp: 18 14  Temp: 97.8 F (36.6 C) 97.8 F (36.6 C)   Vitals:   08/24/16 0700 08/24/16 0730 08/24/16 0825 08/24/16 1123  BP: 114/60 114/72 101/72   Pulse: 69 73 64   Resp: 19 16 18 14   Temp:   97.8 F (36.6 C) 97.8 F (36.6 C)  TempSrc:   Oral Oral  SpO2: 98% 100% 100%   Weight:      Height:       General: Pt is alert, awake, not in acute distress Cardiovascular: RRR, S1/S2 +, no rubs, no gallops Respiratory: CTA bilaterally, no wheezing, no rhonchi Abdominal: Soft, NT, ND, bowel sounds + Extremities: no edema, no cyanosis  The results of significant diagnostics from this hospitalization (including imaging, microbiology, ancillary and laboratory) are listed below for reference.     Microbiology: Recent Results (from the past 240 hour(s))  MRSA PCR Screening     Status: None   Collection Time: 08/24/16  9:08 AM  Result Value Ref Range Status   MRSA by PCR NEGATIVE NEGATIVE Final    Comment:        The  GeneXpert MRSA Assay (FDA approved for NASAL specimens only), is one component of a comprehensive MRSA colonization surveillance program. It is not intended to diagnose MRSA infection nor to guide or monitor treatment for MRSA infections.      Labs: BNP (last 3 results) No results for input(s): BNP in the last 8760 hours. Basic Metabolic Panel:  Recent Labs Lab 08/24/16 0602 08/24/16 0945  NA 141  --   K 3.5  --   CL 102  --   CO2 28  --   GLUCOSE 98  --   BUN 18  --   CREATININE 0.80 0.83  CALCIUM 9.5  --    Liver Function Tests:  Recent Labs Lab 08/24/16 0602  AST 29  ALT 18  ALKPHOS 63  BILITOT 1.5*  PROT 7.9  ALBUMIN 4.5    Recent Labs Lab 08/24/16 0602  LIPASE 31   No results for input(s): AMMONIA in the last 168 hours. CBC:  Recent Labs Lab 08/24/16 0602 08/24/16 0945  WBC 6.3 5.7  HGB 15.3* 14.9  HCT 46.5* 45.5  MCV 91.9 91.5  PLT 230 191   Cardiac Enzymes:  Recent Labs Lab 08/24/16 0945 08/24/16 1515  TROPONINI <0.03 <0.03   BNP: Invalid input(s): POCBNP CBG: No results for input(s): GLUCAP in the last 168 hours. D-Dimer No results for input(s): DDIMER in the last 72 hours. Hgb A1c No results for input(s): HGBA1C in the last 72 hours. Lipid Profile No results for input(s): CHOL, HDL, LDLCALC, TRIG, CHOLHDL, LDLDIRECT in the last 72 hours. Thyroid function studies No results for input(s): TSH, T4TOTAL, T3FREE, THYROIDAB in the last 72 hours.  Invalid input(s): FREET3 Anemia work up No results for input(s): VITAMINB12, FOLATE, FERRITIN, TIBC, IRON, RETICCTPCT in the last 72 hours. Urinalysis    Component Value Date/Time   BILIRUBINUR neg 02/16/2015 1210   PROTEINUR neg 02/16/2015 1210   UROBILINOGEN negative 02/16/2015 1210   NITRITE neg 02/16/2015 1210   LEUKOCYTESUR moderate (2+) (A) 02/16/2015 1210   Sepsis Labs Invalid input(s): PROCALCITONIN,  WBC,  LACTICIDVEN Microbiology Recent Results (from the past 240  hour(s))  MRSA PCR Screening     Status: None   Collection Time: 08/24/16  9:08 AM  Result Value Ref Range Status   MRSA by PCR NEGATIVE NEGATIVE Final    Comment:        The GeneXpert MRSA Assay (FDA approved for NASAL specimens only), is one component of a comprehensive MRSA colonization surveillance program. It is not intended to diagnose MRSA infection nor to guide or monitor treatment for MRSA infections.    Time coordinating discharge: 27 minutes  SIGNED:  Standley Dakins, MD  [facility] 08/24/2016, 3:56 PM Pager   If 7PM-7AM, please contact night-coverage [email or web address] Password [email or web address]

## 2016-08-24 NOTE — ED Provider Notes (Signed)
[facility]EM[facility]PT Provider Note   CSN: [id number] Arrival date & time: 08/24/16  0531     History   Chief Complaint Chief Complaint  Patient presents with  . Chest Pain    HPI Kelly [name] is a 76 y.o. female.  Patient presents with central chest pressure that woke her from sleep around 4 AM it has been constant but improving. The pain radiates to her upper back and shoulders. It is associated with some shortness of breath. Denies nausea denies diaphoresis denies near-syncope or dizziness. She had a similar episode of pain yesterday that lasted a few minutes and has since resolved. He states that occasionally over the past several weeks. Patient has a history of nonischemic cardiomyopathy, atrial fibrillation on Eliquis, pulmonary hypertension, , hyperlipidemia she did not take anything at home for the pain other than her usual medications. It is steadily improving.   The history is provided by the patient and the spouse.  Chest Pain   Associated symptoms include shortness of breath. Pertinent negatives include no abdominal pain, no cough, no dizziness, no fever, no headaches, no nausea, no vomiting and no weakness.    Past Medical History:  Diagnosis Date  . Depression with anxiety   . Dysrhythmia    AFib  . HLD (hyperlipidemia)   . HTN (hypertension)   . Migraine   . NICM (nonischemic cardiomyopathy) (HCC)    a. LHC 8/02: EF 55%, 2+ MR, normal cors;  b.  Echo 3/10: EF 50-55%, mild diast dysfn, mild MVP (ant leaflet), mild MR, mild TR, small pericard eff.;   c.  Myoview 3/10:  EF 69%, no ischemia;  d. Echo 9/13: EF 50-55%, Gr 1 diast dysfn, mild prolapse of ant MV leaflet;   e. Myoview 9/13: EF 64%, NL perfusion;  f.  Echo (11/15):  EF 50-55%, normal diastolic function, mild MR  . Pulmonary HTN     Patient Active Problem List   Diagnosis Date Noted  . Persistent atrial fibrillation (HCC)   . Skin lesion of left leg 06/03/2016  . Aortic atherosclerosis (HCC) 05/22/2016   . Dyspnea 05/10/2016  . Depression with anxiety 12/12/2010  . Pulmonary HTN 12/12/2010  . Migraine 12/12/2010  . CARDIOMYOPATHY 08/16/2010  . PURE HYPERCHOLESTEROLEMIA 07/21/2009  . CHEST PAIN UNSPECIFIED 07/21/2009    Past Surgical History:  Procedure Laterality Date  . ABDOMINAL HYSTERECTOMY    . APPENDECTOMY    . CARDIOVERSION N/A 07/05/2016   Procedure: CARDIOVERSION;  Surgeon: Jonelle Sidle, MD;  Location: [facility];  Service: Cardiovascular;  Laterality: N/A;  . CHOLECYSTECTOMY    . EYE SURGERY Bilateral    cataract removal with lens implants  . IR GENERIC HISTORICAL  05/13/2016   IR RADIOLOGY PERIPHERAL GUIDED IV START 05/13/2016 Malachy Moan, MD [facility]  . IR GENERIC HISTORICAL  05/13/2016   IR US GUIDE VASC ACCESS RIGHT 05/13/2016 Malachy Moan, MD [facility]    OB History    No data available       Home Medications    Prior to Admission medications   Medication Sig Start Date End Date Taking? Authorizing Provider  apixaban (ELIQUIS) 5 MG TABS tablet Take 1 tablet (5 mg total) by mouth 2 (two) times daily. 06/18/16   Rollene Rotunda, MD  cetirizine (ZYRTEC) 10 MG tablet Take 10 mg by mouth daily as needed for allergies.    Historical Provider, MD  cholecalciferol (VITAMIN D) 1000 UNITS tablet Take 1,000 Units by mouth daily.    Historical  Provider, MD  clonazePAM (KLONOPIN) 0.5 MG tablet Take 0.5 mg by mouth daily as needed for anxiety.    Historical Provider, MD  furosemide (LASIX) 40 MG tablet Take 1 tablet (40 mg total) by mouth daily. 07/01/16   Rollene Rotunda, MD  hydrOXYzine (ATARAX/VISTARIL) 25 MG tablet Take 1 tablet (25 mg total) by mouth every 6 (six) hours as needed for itching. 06/10/16   Mechele Claude, MD  meclizine (ANTIVERT) 12.5 MG tablet TAKE 1 TABLET BY MOUTH EVERY 12 TO 24 HOURS AS NEEDED Patient taking differently: TAKE 1 TABLET BY MOUTH EVERY 12 TO 24 HOURS AS NEEDED FOR VERTIGO 04/08/16   Mary-Margaret Daphine Deutscher, FNP  metoprolol  succinate (TOPROL XL) 25 MG 24 hr tablet Take 1 tablet (25 mg total) by mouth daily. 06/18/16   Rollene Rotunda, MD  naphazoline-pheniramine (NAPHCON-A) 0.025-0.3 % ophthalmic solution Place 1 drop into both eyes 4 (four) times daily as needed for irritation or allergies.    Historical Provider, MD  pravastatin (PRAVACHOL) 40 MG tablet Take 1 tablet (40 mg total) by mouth daily. 05/22/16   Johna Sheriff, MD  sildenafil (REVATIO) 20 MG tablet Take 1 tablet (20 mg total) by mouth 3 (three) times daily. For breathing difficulty from high blood pressure in the lungs 08/09/16   Rollene Rotunda, MD    Family History Family History  Problem Relation Age of Onset  . Asthma Mother   . COPD Mother   . Heart disease Mother   . Thrombosis Father   . Cancer Sister   . Heart disease Brother   . Heart attack Brother   . Stroke Neg Hx     Social History Social History  Substance Use Topics  . Smoking status: Never Smoker  . Smokeless tobacco: Never Used  . Alcohol use No     Allergies   Codeine and Simvastatin   Review of Systems Review of Systems  Constitutional: Negative for activity change, appetite change and fever.  HENT: Negative for congestion and nosebleeds.   Eyes: Negative for visual disturbance.  Respiratory: Positive for chest tightness and shortness of breath. Negative for cough.   Cardiovascular: Positive for chest pain.  Gastrointestinal: Negative for abdominal pain, nausea and vomiting.  Endocrine: Negative for polyphagia and polyuria.  Genitourinary: Negative for dysuria, vaginal bleeding and vaginal discharge.  Musculoskeletal: Negative for arthralgias and myalgias.  Neurological: Negative for dizziness, weakness, light-headedness and headaches.  A complete 10 system review of systems was obtained and all systems are negative except as noted in the HPI and PMH.     Physical Exam Updated Vital Signs BP 136/59 (BP Location: Left Arm)   Pulse 68   Temp 97.7 F (36.5  C) (Oral)   Resp 22   Ht 5\' 2"  (1.575 m)   Wt 140 lb (63.5 kg)   SpO2 98%   BMI 25.61 kg/m   Physical Exam  Constitutional: She is oriented to person, place, and time. She appears well-developed and well-nourished. No distress.  Mildly anxious appearing  HENT:  Head: Normocephalic and atraumatic.  Mouth/Throat: Oropharynx is clear and moist. No oropharyngeal exudate.  Eyes: Conjunctivae and EOM are normal. Pupils are equal, round, and reactive to light.  Neck: Normal range of motion. Neck supple.  No meningismus.  Cardiovascular: Normal rate, regular rhythm, normal heart sounds and intact distal pulses.   No murmur heard. Pulmonary/Chest: Effort normal and breath sounds normal. No respiratory distress. She exhibits no tenderness.  Abdominal: Soft. There is no tenderness. There  is no rebound and no guarding.  Musculoskeletal: Normal range of motion. She exhibits no edema or tenderness.  Neurological: She is alert and oriented to person, place, and time. No cranial nerve deficit. She exhibits normal muscle tone. Coordination normal.  No ataxia on finger to nose bilaterally. No pronator drift. 5/5 strength throughout. CN 2-12 intact.Equal grip strength. Sensation intact.   Skin: Skin is warm.  Psychiatric: She has a normal mood and affect. Her behavior is normal.  Nursing note and vitals reviewed.    ED Treatments / Results  Labs (all labs ordered are listed, but only abnormal results are displayed) Labs Reviewed  CBC - Abnormal; Notable for the following:       Result Value   Hemoglobin 15.3 (*)    HCT 46.5 (*)    All other components within normal limits  HEPATIC FUNCTION PANEL - Abnormal; Notable for the following:    Total Bilirubin 1.5 (*)    Indirect Bilirubin 1.2 (*)    All other components within normal limits  BASIC METABOLIC PANEL  LIPASE, BLOOD  I-STAT TROPOININ, ED    EKG  EKG Interpretation  Date/Time:  Saturday August 24 2016 05:42:41 EST Ventricular  Rate:  76 PR Interval:    QRS Duration: 96 QT Interval:  446 QTC Calculation: 502 R Axis:   71 Text Interpretation:  Sinus rhythm Ventricular premature complex Borderline prolonged PR interval Anteroseptal infarct, age indeterminate Prolonged QT interval No significant change was found Confirmed by Manus Gunning  MD, Malaki Koury [phone]) on 08/24/2016 6:06:25 AM       Radiology Dg Chest 2 View  Result Date: 08/24/2016 CLINICAL DATA:  Awakened this morning by chest pain and dyspnea EXAM: CHEST  2 VIEW COMPARISON:  05/10/2016 FINDINGS: Moderate cardiomegaly, unchanged. The lungs are clear. The pulmonary vasculature is normal. There is no pleural effusion. Hilar and mediastinal contours are unremarkable and unchanged. IMPRESSION: Stable cardiomegaly. No consolidation or effusion. Normal vasculature. Electronically Signed   By: Ellery Plunk M.D.   On: 08/24/2016 06:50    Procedures Procedures (including critical care time)  Medications Ordered in ED Medications  aspirin chewable tablet 324 mg (324 mg Oral Given 08/24/16 9179)     Initial Impression / Assessment and Plan / ED Course  I have reviewed the triage vital signs and the nursing notes.  Pertinent labs & imaging results that were available during my care of the patient were reviewed by me and considered in my medical decision making (see chart for details).  Clinical Course   Central chest pressure that has been constant for the past 2 hours. EKG does not show any acute ST changes.  Per Dr. Antoine Poche: "She has a history of normal coronary arteries by catheterization in 2002. Echocardiogram in 2015 demonstrated normal LV function with mild mitral regurgitation. Myoview in 2013 demonstrated normal perfusion.  Echo earlier this year demonstrated an EF of 35%.  She has moderate MR. "  Pain improving in the ED.  First troponin negative.  Heart score 5. With newly reduced EF, Dr. Antoine Poche was planning ischemic evaluation soon per  notes.  Pain resolved.  Observation admission d/w Dr. Laural Benes for cardiac rule out.   Final Clinical Impressions(s) / ED Diagnoses   Final diagnoses:  Chest pain, unspecified type    New Prescriptions New Prescriptions   No medications on file     Glynn Octave, MD 08/24/16 0825

## 2016-08-24 NOTE — Progress Notes (Signed)
Patient took the majority of her morning medications this morning while in the emergency department. She takes one of her medications at lunch and I will hold this until then.

## 2016-08-24 NOTE — H&P (Signed)
History and Physical  Kelly [name] UDJ:[id number] DOB: May 31, 1941 DOA: 08/24/2016  Referring physician: Manus Gunning PCP: Mechele Claude, MD  Outpatient Specialists:  1. Cardiology: [facility]  Chief Complaint: chest discomfort  HPI: Kelly [name] is a 76 y.o. female presented to ED with brief episode of central chest pressure that woke her from sleep around 4 AM it has been constant but improving. She has been under a lot of stress recently with familyl matters and having a lot of anxiety.  She reports that she took her medications at that time and felt better.  The pressure/pain radiates to her upper back and shoulders. It is associated with some shortness of breath. Denies nausea denies diaphoresis denies near-syncope or dizziness. She had a similar episode of pain yesterday that lasted a few minutes and has since resolved. She states that occasionally over the past several weeks. Patient has a history of nonischemic cardiomyopathy, atrial fibrillation on Eliquis, pulmonary hypertension on sildenifil, hyperlipidemia she did not take anything at home for the pain other than her usual medications. It has completely resolved.  She wants to sign out of hospital and go home.  I was able to convince her to stay to have her troponin tested.     Review of Systems: All systems reviewed and apart from history of presenting illness, are negative.  Past Medical History:  Diagnosis Date  . Depression with anxiety   . Dysrhythmia    AFib  . HLD (hyperlipidemia)   . HTN (hypertension)   . Migraine   . NICM (nonischemic cardiomyopathy) (HCC)    a. LHC 8/02: EF 55%, 2+ MR, normal cors;  b.  Echo 3/10: EF 50-55%, mild diast dysfn, mild MVP (ant leaflet), mild MR, mild TR, small pericard eff.;   c.  Myoview 3/10:  EF 69%, no ischemia;  d. Echo 9/13: EF 50-55%, Gr 1 diast dysfn, mild prolapse of ant MV leaflet;   e. Myoview 9/13: EF 64%, NL perfusion;  f.  Echo (11/15):  EF 50-55%, normal diastolic function,  mild MR  . Pulmonary HTN    Past Surgical History:  Procedure Laterality Date  . ABDOMINAL HYSTERECTOMY    . APPENDECTOMY    . CARDIOVERSION N/A 07/05/2016   Procedure: CARDIOVERSION;  Surgeon: Jonelle Sidle, MD;  Location: [facility];  Service: Cardiovascular;  Laterality: N/A;  . CHOLECYSTECTOMY    . EYE SURGERY Bilateral    cataract removal with lens implants  . IR GENERIC HISTORICAL  05/13/2016   IR RADIOLOGY PERIPHERAL GUIDED IV START 05/13/2016 Malachy Moan, MD [facility]  . IR GENERIC HISTORICAL  05/13/2016   IR US GUIDE VASC ACCESS RIGHT 05/13/2016 Malachy Moan, MD [facility]   Social History:  reports that she has never smoked. She has never used smokeless tobacco. She reports that she does not drink alcohol or use drugs.   Allergies  Allergen Reactions  . Codeine Nausea And Vomiting  . Simvastatin Cough    Made her cough    Family History  Problem Relation Age of Onset  . Asthma Mother   . COPD Mother   . Heart disease Mother   . Thrombosis Father   . Cancer Sister   . Heart disease Brother   . Heart attack Brother   . Stroke Neg Hx      Prior to Admission medications   Medication Sig Start Date End Date Taking? Authorizing Provider  apixaban (ELIQUIS) 5 MG TABS tablet Take 1 tablet (5 mg total)  by mouth 2 (two) times daily. 06/18/16   Rollene Rotunda, MD  cetirizine (ZYRTEC) 10 MG tablet Take 10 mg by mouth daily as needed for allergies.    Historical Provider, MD  cholecalciferol (VITAMIN D) 1000 UNITS tablet Take 1,000 Units by mouth daily.    Historical Provider, MD  clonazePAM (KLONOPIN) 0.5 MG tablet Take 0.5 mg by mouth daily as needed for anxiety.    Historical Provider, MD  furosemide (LASIX) 40 MG tablet Take 1 tablet (40 mg total) by mouth daily. 07/01/16   Rollene Rotunda, MD  hydrOXYzine (ATARAX/VISTARIL) 25 MG tablet Take 1 tablet (25 mg total) by mouth every 6 (six) hours as needed for itching. 06/10/16   Mechele Claude, MD  meclizine  (ANTIVERT) 12.5 MG tablet TAKE 1 TABLET BY MOUTH EVERY 12 TO 24 HOURS AS NEEDED Patient taking differently: TAKE 1 TABLET BY MOUTH EVERY 12 TO 24 HOURS AS NEEDED FOR VERTIGO 04/08/16   Mary-Margaret Daphine Deutscher, FNP  metoprolol succinate (TOPROL XL) 25 MG 24 hr tablet Take 1 tablet (25 mg total) by mouth daily. 06/18/16   Rollene Rotunda, MD  naphazoline-pheniramine (NAPHCON-A) 0.025-0.3 % ophthalmic solution Place 1 drop into both eyes 4 (four) times daily as needed for irritation or allergies.    Historical Provider, MD  pravastatin (PRAVACHOL) 40 MG tablet Take 1 tablet (40 mg total) by mouth daily. 05/22/16   Johna Sheriff, MD  sildenafil (REVATIO) 20 MG tablet Take 1 tablet (20 mg total) by mouth 3 (three) times daily. For breathing difficulty from high blood pressure in the lungs 08/09/16   Rollene Rotunda, MD   Physical Exam: Vitals:   08/24/16 0630 08/24/16 0700 08/24/16 0730 08/24/16 0825  BP: 103/84 114/60 114/72 101/72  Pulse: 65 69 73 64  Resp: 15 19 16 18   Temp:    97.8 F (36.6 C)  TempSrc:    Oral  SpO2: 99% 98% 100% 100%  Weight:      Height:         General exam: Moderately built and nourished patient, lying comfortably supine on the gurney in no obvious distress.  Head, eyes and ENT: Nontraumatic and normocephalic. Pupils equally reacting to light and accommodation. Oral mucosa moist.  Neck: Supple. No JVD, carotid bruit or thyromegaly.  Lymphatics: No lymphadenopathy.  Respiratory system: Clear to auscultation. No increased work of breathing.  Cardiovascular system: S1 and S2 heard, RRR. No JVD, murmurs, gallops, clicks or pedal edema.  Gastrointestinal system: Abdomen is nondistended, soft and nontender. Normal bowel sounds heard. No organomegaly or masses appreciated.  Central nervous system: Alert and oriented. No focal neurological deficits.  Extremities: Symmetric 5 x 5 power. Peripheral pulses symmetrically felt.   Skin: No rashes or acute  findings.  Musculoskeletal system: Negative exam.  Psychiatry: Pleasant and cooperative.   Labs on Admission:  Basic Metabolic Panel:  Recent Labs Lab 08/24/16 0602  NA 141  K 3.5  CL 102  CO2 28  GLUCOSE 98  BUN 18  CREATININE 0.80  CALCIUM 9.5   Liver Function Tests:  Recent Labs Lab 08/24/16 0602  AST 29  ALT 18  ALKPHOS 63  BILITOT 1.5*  PROT 7.9  ALBUMIN 4.5    Recent Labs Lab 08/24/16 0602  LIPASE 31   No results for input(s): AMMONIA in the last 168 hours. CBC:  Recent Labs Lab 08/24/16 0602  WBC 6.3  HGB 15.3*  HCT 46.5*  MCV 91.9  PLT 230   Cardiac Enzymes: No results for  input(s): CKTOTAL, CKMB, CKMBINDEX, TROPONINI in the last 168 hours.  BNP (last 3 results) No results for input(s): PROBNP in the last 8760 hours. CBG: No results for input(s): GLUCAP in the last 168 hours.  Radiological Exams on Admission: Dg Chest 2 View  Result Date: 08/24/2016 CLINICAL DATA:  Awakened this morning by chest pain and dyspnea EXAM: CHEST  2 VIEW COMPARISON:  05/10/2016 FINDINGS: Moderate cardiomegaly, unchanged. The lungs are clear. The pulmonary vasculature is normal. There is no pleural effusion. Hilar and mediastinal contours are unremarkable and unchanged. IMPRESSION: Stable cardiomegaly. No consolidation or effusion. Normal vasculature. Electronically Signed   By: Ellery Plunk M.D.   On: 08/24/2016 06:50    EKG: Independently reviewed.   Assessment/Plan Active Problems:   Pure hypercholesterolemia   NICM (nonischemic cardiomyopathy) (HCC)   Aortic atherosclerosis (HCC)   Persistent atrial fibrillation (HCC)   Chest pain   Chest pain at rest  Chest Pain - symptoms completely resolved now, suspect her symptoms were related to anxiety but given her cardiac history would cycle troponin.  First troponin has been negative, monitor on telemetry, check 2 more sets and plan to discharge later if negative.  Pt threatening to leave AMA this  morning.  I was able to convince her to stay and have full 3 sets of troponin and she likely could go home later if negative.  She says that she would follow up with Dr. Antoine Poche her cardiologist.     DVT Prophylaxis: heparin Code Status: full   Family Communication: husband  Disposition Plan: home later today if workup negative   Time spent:55 mins  Standley Dakins, MD [facility] Pager [phone]  If 7PM-7AM, please contact night-coverage [email or web address] Password [email or web address] 08/24/2016, 9:22 AM

## 2016-08-24 NOTE — ED Notes (Signed)
Pt given cup of water at this time  ?

## 2016-08-31 DIAGNOSIS — H578 Other specified disorders of eye and adnexa: Secondary | ICD-10-CM | POA: Diagnosis not present

## 2016-09-04 ENCOUNTER — Ambulatory Visit: Payer: Medicare Other | Admitting: Cardiology

## 2016-09-06 ENCOUNTER — Telehealth: Payer: Self-pay | Admitting: Family Medicine

## 2016-09-06 NOTE — Telephone Encounter (Signed)
Pt requesting appt for ED follow up appt given

## 2016-09-09 ENCOUNTER — Ambulatory Visit (INDEPENDENT_AMBULATORY_CARE_PROVIDER_SITE_OTHER): Payer: Medicare Other | Admitting: Family Medicine

## 2016-09-09 ENCOUNTER — Encounter: Payer: Self-pay | Admitting: Family Medicine

## 2016-09-09 VITALS — BP 112/67 | HR 82 | Temp 96.6°F | Ht 62.0 in | Wt 144.0 lb

## 2016-09-09 DIAGNOSIS — I4819 Other persistent atrial fibrillation: Secondary | ICD-10-CM

## 2016-09-09 DIAGNOSIS — R079 Chest pain, unspecified: Secondary | ICD-10-CM | POA: Diagnosis not present

## 2016-09-09 DIAGNOSIS — I272 Pulmonary hypertension, unspecified: Secondary | ICD-10-CM

## 2016-09-09 DIAGNOSIS — I481 Persistent atrial fibrillation: Secondary | ICD-10-CM | POA: Diagnosis not present

## 2016-09-09 DIAGNOSIS — R0602 Shortness of breath: Secondary | ICD-10-CM

## 2016-09-09 MED ORDER — CLONAZEPAM 0.5 MG PO TABS: 0.5000 mg | ORAL_TABLET | Freq: Every day | ORAL | 0 refills | Status: DC | PRN

## 2016-09-09 MED ORDER — SILDENAFIL CITRATE 20 MG PO TABS: 20.0000 mg | ORAL_TABLET | Freq: Three times a day (TID) | ORAL | 3 refills | Status: DC

## 2016-09-09 NOTE — Progress Notes (Signed)
Subjective:  Patient ID: Kelly [name], female    DOB: 1941-04-27  Age: 76 y.o. MRN: [id number]  CC: Hospitalization Follow-up (pt here today for follow up after being in the ED for SOB which she believes is anxiety)   HPI Kelly [name] presents for Patient was discharged from hospital 2 weeks ago after being ruled out for MI. She is having no pulmonary hypertension patient. She was taken off of her sildenafil due to difficulty obtaining the drug. Since that time she has noted increasing chest tightness and shortness of breath. A few days ago at 4 AM she called the emergency squad and they told her she had anxiety and needed to see her doctor for something for her anxiety. She continues to have dyspnea and intermittent chest tightness. This is not exertional. It is not positional. There is no significant edema. No rash. No fever chills or sweats.   History Kelly [name] has a past medical history of Depression with anxiety; Dysrhythmia; HLD (hyperlipidemia); HTN (hypertension); Migraine; NICM (nonischemic cardiomyopathy) (HCC); and Pulmonary HTN.   She has a past surgical history that includes Abdominal hysterectomy; Cholecystectomy; Appendectomy; ir generic historical (05/13/2016); ir generic historical (05/13/2016); Eye surgery (Bilateral); and Cardioversion (N/A, 07/05/2016).   Her family history includes Asthma in her mother; COPD in her mother; Cancer in her sister; Heart attack in her brother; Heart disease in her brother and mother; Thrombosis in her father.She reports that she has never smoked. She has never used smokeless tobacco. She reports that she does not drink alcohol or use drugs.    ROS Review of Systems  Constitutional: Negative for activity change, appetite change and fever.  HENT: Negative for congestion, rhinorrhea and sore throat.   Eyes: Negative for visual disturbance.  Respiratory: Positive for chest tightness (nonradiating) and shortness of breath. Negative for cough.     Cardiovascular: Positive for chest pain. Negative for palpitations.  Gastrointestinal: Negative for abdominal pain, diarrhea and nausea.  Genitourinary: Negative for dysuria.    Objective:  BP 112/67   Pulse 82   Temp (!) 96.6 F (35.9 C) (Oral)   Ht 5\' 2"  (1.575 m)   Wt 144 lb (65.3 kg)   BMI 26.34 kg/m   BP Readings from Last 3 Encounters:  09/09/16 112/67  08/24/16 101/72  08/02/16 118/76    Wt Readings from Last 3 Encounters:  09/09/16 144 lb (65.3 kg)  08/24/16 140 lb (63.5 kg)  08/02/16 143 lb 3.2 oz (65 kg)     Physical Exam  Dg Chest 2 View  Result Date: 08/24/2016 CLINICAL DATA:  Awakened this morning by chest pain and dyspnea EXAM: CHEST  2 VIEW COMPARISON:  05/10/2016 FINDINGS: Moderate cardiomegaly, unchanged. The lungs are clear. The pulmonary vasculature is normal. There is no pleural effusion. Hilar and mediastinal contours are unremarkable and unchanged. IMPRESSION: Stable cardiomegaly. No consolidation or effusion. Normal vasculature. Electronically Signed   By: Ellery Plunk M.D.   On: 08/24/2016 06:50    Assessment & Plan:   [name] was seen today for hospitalization follow-up.  Diagnoses and all orders for this visit:  SOB (shortness of breath) -     PR BREATHING CAPACITY TEST  Pulmonary HTN  Shortness of breath  Persistent atrial fibrillation (HCC)  Chest pain at rest  Other orders -     clonazePAM (KLONOPIN) 0.5 MG tablet; Take 1 tablet (0.5 mg total) by mouth daily as needed for anxiety. -     sildenafil (REVATIO) 20 MG tablet; Take  1 tablet (20 mg total) by mouth 3 (three) times daily. For breathing difficulty from high blood pressure in the lungs      I have changed [name]'s clonazePAM. I am also having her maintain her meclizine, hydrOXYzine, metoprolol succinate, apixaban, furosemide, naphazoline-pheniramine, and sildenafil.  Allergies as of 09/09/2016      Reactions   Codeine Nausea And Vomiting   Simvastatin Cough    Made her cough      Medication List       Accurate as of 09/09/16 10:37 PM. Always use your most recent med list.          apixaban 5 MG Tabs tablet Commonly known as:  ELIQUIS Take 1 tablet (5 mg total) by mouth 2 (two) times daily.   clonazePAM 0.5 MG tablet Commonly known as:  KLONOPIN Take 1 tablet (0.5 mg total) by mouth daily as needed for anxiety.   furosemide 40 MG tablet Commonly known as:  LASIX Take 1 tablet (40 mg total) by mouth daily.   hydrOXYzine 25 MG tablet Commonly known as:  ATARAX/VISTARIL Take 1 tablet (25 mg total) by mouth every 6 (six) hours as needed for itching.   meclizine 12.5 MG tablet Commonly known as:  ANTIVERT TAKE 1 TABLET BY MOUTH EVERY 12 TO 24 HOURS AS NEEDED   metoprolol succinate 25 MG 24 hr tablet Commonly known as:  TOPROL XL Take 1 tablet (25 mg total) by mouth daily.   naphazoline-pheniramine 0.025-0.3 % ophthalmic solution Commonly known as:  NAPHCON-A Place 1 drop into both eyes 4 (four) times daily as needed for irritation or allergies.   sildenafil 20 MG tablet Commonly known as:  REVATIO Take 1 tablet (20 mg total) by mouth 3 (three) times daily. For breathing difficulty from high blood pressure in the lungs      Resume Revatio.  Follow-up: Return in about 1 month (around 10/10/2016).  Mechele Claude, M.D.

## 2016-09-12 ENCOUNTER — Ambulatory Visit (INDEPENDENT_AMBULATORY_CARE_PROVIDER_SITE_OTHER): Payer: Medicare Other | Admitting: Cardiology

## 2016-09-12 ENCOUNTER — Encounter: Payer: Self-pay | Admitting: Cardiology

## 2016-09-12 VITALS — BP 135/74 | HR 72 | Ht 62.0 in | Wt 145.0 lb

## 2016-09-12 DIAGNOSIS — I4819 Other persistent atrial fibrillation: Secondary | ICD-10-CM

## 2016-09-12 DIAGNOSIS — I481 Persistent atrial fibrillation: Secondary | ICD-10-CM | POA: Diagnosis not present

## 2016-09-12 DIAGNOSIS — I420 Dilated cardiomyopathy: Secondary | ICD-10-CM | POA: Diagnosis not present

## 2016-09-12 DIAGNOSIS — R072 Precordial pain: Secondary | ICD-10-CM | POA: Diagnosis not present

## 2016-09-12 NOTE — Progress Notes (Signed)
Cardiology Office Note   Date:  09/13/2016   ID:  Kelly [name], DOB 1941/06/13, MRN [id number]  PCP:  Mechele Claude, MD  Cardiologist:   Rollene Rotunda, MD  Referring:  Mechele Claude, MD  Chief Complaint  Patient presents with  . Chest Pain     History of Present Illness: Kelly [name] is a 76 y.o. female who presents for follow up of NICM.  She has a history of normal coronary arteries by catheterization in 2002. Echocardiogram in 2015 demonstrated normal LV function with mild mitral regurgitation. Myoview in 2013 demonstrated normal perfusion.  Echo earlier this year demonstrated an EF of 35%.  She had atrial fib with rapid rate. She was cardioverted in November.  I saw her following that and she was doing relatively well.  She was admitted recently for chest pain.  She had negative enzymes and this was thought to be related to anxiety.  I reviewed the hospital records.   She said that this was chest tightness. She took 4 baby aspirin in the emergency room and went away. She has since had no further chest pain, neck or arm discomfort. She hasn't noticed any palpitations and has had no presyncope or syncope. She says she occasionally gets a little short of breath but it's much better than it was previously.  Past Medical History:  Diagnosis Date  . Depression with anxiety   . Dysrhythmia    AFib  . HLD (hyperlipidemia)   . HTN (hypertension)   . Migraine   . NICM (nonischemic cardiomyopathy) (HCC)    a. LHC 8/02: EF 55%, 2+ MR, normal cors;  b.  Echo 3/10: EF 50-55%, mild diast dysfn, mild MVP (ant leaflet), mild MR, mild TR, small pericard eff.;   c.  Myoview 3/10:  EF 69%, no ischemia;  d. Echo 9/13: EF 50-55%, Gr 1 diast dysfn, mild prolapse of ant MV leaflet;   e. Myoview 9/13: EF 64%, NL perfusion;  f.  Echo (11/15):  EF 50-55%, normal diastolic function, mild MR  . Pulmonary HTN     Past Surgical History:  Procedure Laterality Date  . ABDOMINAL HYSTERECTOMY    .  APPENDECTOMY    . CARDIOVERSION N/A 07/05/2016   Procedure: CARDIOVERSION;  Surgeon: Jonelle Sidle, MD;  Location: [facility];  Service: Cardiovascular;  Laterality: N/A;  . CHOLECYSTECTOMY    . EYE SURGERY Bilateral    cataract removal with lens implants  . IR GENERIC HISTORICAL  05/13/2016   IR RADIOLOGY PERIPHERAL GUIDED IV START 05/13/2016 Malachy Moan, MD [facility]  . IR GENERIC HISTORICAL  05/13/2016   IR US GUIDE VASC ACCESS RIGHT 05/13/2016 Malachy Moan, MD [facility]     Current Outpatient Prescriptions  Medication Sig Dispense Refill  . apixaban (ELIQUIS) 5 MG TABS tablet Take 1 tablet (5 mg total) by mouth 2 (two) times daily. 28 tablet 0  . clonazePAM (KLONOPIN) 0.5 MG tablet Take 1 tablet (0.5 mg total) by mouth daily as needed for anxiety. 30 tablet 0  . furosemide (LASIX) 40 MG tablet Take 1 tablet (40 mg total) by mouth daily. 30 tablet 9  . hydrOXYzine (ATARAX/VISTARIL) 25 MG tablet Take 1 tablet (25 mg total) by mouth every 6 (six) hours as needed for itching. 30 tablet 5  . meclizine (ANTIVERT) 12.5 MG tablet TAKE 1 TABLET BY MOUTH EVERY 12 TO 24 HOURS AS NEEDED (Patient taking differently: TAKE 1 TABLET BY MOUTH EVERY 12 TO 24 HOURS AS NEEDED FOR  VERTIGO) 40 tablet 0  . metoprolol succinate (TOPROL XL) 25 MG 24 hr tablet Take 1 tablet (25 mg total) by mouth daily. 30 tablet 6  . naphazoline-pheniramine (NAPHCON-A) 0.025-0.3 % ophthalmic solution Place 1 drop into both eyes 4 (four) times daily as needed for irritation or allergies.    . sildenafil (REVATIO) 20 MG tablet Take 1 tablet (20 mg total) by mouth 3 (three) times daily. For breathing difficulty from high blood pressure in the lungs 270 tablet 3   No current facility-administered medications for this visit.     Allergies:   Codeine and Simvastatin    ROS:  Please see the history of present illness.   Otherwise, review of systems are positive for none.   All other systems are reviewed and negative.      PHYSICAL EXAM: VS:  BP 135/74   Pulse 72   Ht 5\' 2"  (1.575 m)   Wt 145 lb (65.8 kg)   BMI 26.52 kg/m  , BMI Body mass index is 26.52 kg/m. GENERAL:  Well appearing HEENT:  Pupils equal round and reactive, fundi not visualized, oral mucosa unremarkable NECK:  Positive jugular venous distention, waveform within normal limits, carotid upstroke brisk and symmetric, no bruits, no thyromegaly LYMPHATICS:  No cervical, inguinal adenopathy LUNGS:  Clear to auscultation bilaterally BACK:  No CVA tenderness CHEST:  Unremarkable HEART:  PMI not displaced or sustained,S1 and S2 within normal limits, no S3,  no clicks, no rubs, no murmurs ABD:  Flat, positive bowel sounds normal in frequency in pitch, no bruits, no rebound, no guarding, no midline pulsatile mass, no hepatomegaly, no splenomegaly EXT:  2 plus pulses throughout, no edema, no cyanosis no clubbing   EKG:  EKG is  not ordered today.   Recent Labs: 07/03/2016: TSH 2.377 08/24/2016: ALT 18; BUN 18; Creatinine, Ser 0.83; Hemoglobin 14.9; Platelets 191; Potassium 3.5; Sodium 141    Lipid Panel    Component Value Date/Time   CHOL 236 (H) 02/16/2015 1209   TRIG 120 02/16/2015 1209   HDL 53 02/16/2015 1209   CHOLHDL 4.5 (H) 02/16/2015 1209   CHOLHDL 3 10/22/2010 0829   VLDL 14.6 10/22/2010 0829   LDLCALC 159 (H) 02/16/2015 1209   LDLDIRECT 151.3 08/16/2010 1118      Wt Readings from Last 3 Encounters:  09/12/16 145 lb (65.8 kg)  09/09/16 144 lb (65.3 kg)  08/24/16 140 lb (63.5 kg)      Other studies Reviewed: Additional studies/ records that were reviewed today include:  Hospital records Review of the above records demonstrates:      ASSESSMENT AND PLAN:  CHEST PAIN:  I reviewed the hospital records. She's no longer having the pain. There was no objective evidence of ischemia. At this point no further cardiovascular testing is indicated.  HTN:   The blood pressure is at target. No change in medications is  indicated. We will continue with therapeutic lifestyle changes (TLC).    CM:  She has had a moderately reduced ejection fraction.  I will follow up in May with another echo .   ATRIAL FIB:    [name] has a CHA2DS2 - VASc score of 4 with a risk of stroke of 4%.   She is status post cardioversion.  She will continue the meds as listed.    PULM HTN:  I reviewed the records and I do think that the pulmonary hypertension is probably secondary related to the valve disease and cardiomyopathy. I don't  think that sildenafil is indicated and we will reassess this when she has an echo again in May.  Current medicines are reviewed at length with the patient today.  The patient does not have concerns regarding medicines.  The following changes have been made:  None  Labs/ tests ordered today include:     No orders of the defined types were placed in this encounter.    Disposition:   FU with me May   Signed, Rollene Rotunda, MD  09/13/2016 4:50 PM    [facility]

## 2016-09-12 NOTE — Patient Instructions (Signed)
Medication Instructions:  Continue current medications  Labwork: None Ordered  Testing/Procedures: Your physician has requested that you have an echocardiogram in May. Echocardiography is a painless test that uses sound waves to create images of your heart. It provides your doctor with information about the size and shape of your heart and how well your heart's chambers and valves are working. This procedure takes approximately one hour. There are no restrictions for this procedure.  Follow-Up: Your physician recommends that you schedule a follow-up appointment in: June 2018   Any Other Special Instructions Will Be Listed Below (If Applicable).   If you need a refill on your cardiac medications before your next appointment, please call your pharmacy.

## 2016-09-13 ENCOUNTER — Encounter: Payer: Self-pay | Admitting: Cardiology

## 2016-09-18 ENCOUNTER — Other Ambulatory Visit: Payer: Self-pay | Admitting: Physician Assistant

## 2016-09-18 NOTE — Telephone Encounter (Signed)
Rx request sent to pharmacy.  

## 2016-09-23 ENCOUNTER — Ambulatory Visit: Payer: Medicare Other | Admitting: Family Medicine

## 2016-09-24 ENCOUNTER — Ambulatory Visit: Payer: Medicare Other | Admitting: Cardiology

## 2016-11-12 ENCOUNTER — Other Ambulatory Visit: Payer: Self-pay | Admitting: Family Medicine

## 2016-12-17 ENCOUNTER — Other Ambulatory Visit: Payer: Self-pay | Admitting: Cardiology

## 2016-12-17 NOTE — Telephone Encounter (Signed)
Rx request sent to pharmacy.  

## 2016-12-25 ENCOUNTER — Other Ambulatory Visit: Payer: Self-pay

## 2016-12-25 ENCOUNTER — Ambulatory Visit (HOSPITAL_COMMUNITY): Payer: Medicare Other | Attending: Cardiovascular Disease

## 2016-12-25 DIAGNOSIS — I429 Cardiomyopathy, unspecified: Secondary | ICD-10-CM | POA: Diagnosis not present

## 2017-01-10 ENCOUNTER — Other Ambulatory Visit: Payer: Self-pay | Admitting: Nurse Practitioner

## 2017-01-25 ENCOUNTER — Other Ambulatory Visit: Payer: Self-pay | Admitting: Family Medicine

## 2017-01-28 NOTE — Telephone Encounter (Signed)
Authorize 30 days only. Then contact the patient letting them know that they will need an appointment before any further prescriptions can be sent in. 

## 2017-01-29 NOTE — Telephone Encounter (Signed)
Rx phoned into pharmacy and pt is aware no further refills until she is seen by Dr Darlyn Read. She states she will call back to schedule when she has her calendar.

## 2017-02-07 ENCOUNTER — Ambulatory Visit (INDEPENDENT_AMBULATORY_CARE_PROVIDER_SITE_OTHER): Payer: Medicare Other | Admitting: Physician Assistant

## 2017-02-07 ENCOUNTER — Encounter: Payer: Self-pay | Admitting: Physician Assistant

## 2017-02-07 VITALS — BP 111/57 | HR 79 | Temp 96.9°F | Ht 62.0 in | Wt 149.0 lb

## 2017-02-07 DIAGNOSIS — G43809 Other migraine, not intractable, without status migrainosus: Secondary | ICD-10-CM | POA: Diagnosis not present

## 2017-02-07 MED ORDER — KETOROLAC TROMETHAMINE 60 MG/2ML IM SOLN
60.0000 mg | Freq: Once | INTRAMUSCULAR | Status: AC
  Administered 2017-02-07: 60 mg via INTRAMUSCULAR

## 2017-02-07 MED ORDER — BUTALBITAL-APAP-CAFFEINE 50-325-40 MG PO TABS: 1.0000 | ORAL_TABLET | Freq: Four times a day (QID) | ORAL | 0 refills | Status: DC | PRN

## 2017-02-07 NOTE — Patient Instructions (Signed)

## 2017-02-09 NOTE — Progress Notes (Signed)
BP (!) 111/57   Pulse 79   Temp (!) 96.9 F (36.1 C) (Oral)   Ht 5\' 2"  (1.575 m)   Wt 149 lb (67.6 kg)   BMI 27.25 kg/m    Subjective:    Patient ID: Kelly [name], female    DOB: 1940/08/29, 76 y.o.   MRN: [id number]  HPI: Kelly [name] is a 76 y.o. female presenting on 02/07/2017 for No chief complaint on file. Headache fpr 2 days, worsening this morning, no OTC has helped. Associated nausea, photo and phonophobia. Denies vomiting.  No specific trigger. She had migraines a lot as a younger woman. Denies any other neurologic deficit.  Relevant past medical, surgical, family and social history reviewed and updated as indicated. Allergies and medications reviewed and updated.  Past Medical History:  Diagnosis Date  . Depression with anxiety   . Dysrhythmia    AFib  . HLD (hyperlipidemia)   . HTN (hypertension)   . Migraine   . NICM (nonischemic cardiomyopathy) (HCC)    a. LHC 8/02: EF 55%, 2+ MR, normal cors;  b.  Echo 3/10: EF 50-55%, mild diast dysfn, mild MVP (ant leaflet), mild MR, mild TR, small pericard eff.;   c.  Myoview 3/10:  EF 69%, no ischemia;  d. Echo 9/13: EF 50-55%, Gr 1 diast dysfn, mild prolapse of ant MV leaflet;   e. Myoview 9/13: EF 64%, NL perfusion;  f.  Echo (11/15):  EF 50-55%, normal diastolic function, mild MR  . Pulmonary HTN (HCC)     Past Surgical History:  Procedure Laterality Date  . ABDOMINAL HYSTERECTOMY    . APPENDECTOMY    . CARDIOVERSION N/A 07/05/2016   Procedure: CARDIOVERSION;  Surgeon: Jonelle Sidle, MD;  Location: [facility];  Service: Cardiovascular;  Laterality: N/A;  . CHOLECYSTECTOMY    . EYE SURGERY Bilateral    cataract removal with lens implants  . IR GENERIC HISTORICAL  05/13/2016   IR RADIOLOGY PERIPHERAL GUIDED IV START 05/13/2016 Malachy Moan, MD [facility]  . IR GENERIC HISTORICAL  05/13/2016   IR US GUIDE VASC ACCESS RIGHT 05/13/2016 Malachy Moan, MD [facility]    Review of Systems  Constitutional:  Positive for fatigue. Negative for chills, diaphoresis and fever.  HENT: Negative.   Eyes: Negative.   Respiratory: Negative.   Cardiovascular: Negative for chest pain, palpitations and leg swelling.  Gastrointestinal: Negative.   Genitourinary: Negative.   Neurological: Positive for headaches. Negative for dizziness, seizures, light-headedness and numbness.    Allergies as of 02/07/2017      Reactions   Codeine Nausea And Vomiting   Simvastatin Cough   Made her cough      Medication List       Accurate as of 02/07/17 11:59 PM. Always use your most recent med list.          apixaban 5 MG Tabs tablet Commonly known as:  ELIQUIS Take 1 tablet (5 mg total) by mouth 2 (two) times daily.   butalbital-acetaminophen-caffeine 50-325-40 MG tablet Commonly known as:  FIORICET, ESGIC Take 1 tablet by mouth every 6 (six) hours as needed for headache or migraine.   clonazePAM 0.5 MG tablet Commonly known as:  KLONOPIN TAKE ONE TABLET BY MOUTH ONCE DAILY AS NEEDED FOR ANXIETY   furosemide 40 MG tablet Commonly known as:  LASIX TAKE ONE TABLET BY MOUTH ONCE DAILY   hydrOXYzine 25 MG tablet Commonly known as:  ATARAX/VISTARIL Take 1 tablet (25 mg total) by mouth  every 6 (six) hours as needed for itching.   meclizine 12.5 MG tablet Commonly known as:  ANTIVERT TAKE ONE TABLET BY MOUTH EVERY 12 TO 24 HOURS AS NEEDED   metoprolol succinate 25 MG 24 hr tablet Commonly known as:  TOPROL-XL TAKE ONE TABLET BY MOUTH ONCE DAILY          Objective:    BP (!) 111/57   Pulse 79   Temp (!) 96.9 F (36.1 C) (Oral)   Ht 5\' 2"  (1.575 m)   Wt 149 lb (67.6 kg)   BMI 27.25 kg/m   Allergies  Allergen Reactions  . Codeine Nausea And Vomiting  . Simvastatin Cough    Made her cough    Physical Exam  Constitutional: She is oriented to person, place, and time. She appears well-developed and well-nourished.  HENT:  Head: Normocephalic and atraumatic.  Right Ear: Tympanic membrane,  external ear and ear canal normal.  Left Ear: Tympanic membrane, external ear and ear canal normal.  Nose: Nose normal. No rhinorrhea.  Mouth/Throat: Oropharynx is clear and moist and mucous membranes are normal. No oropharyngeal exudate or posterior oropharyngeal erythema.  Eyes: Conjunctivae and EOM are normal. Pupils are equal, round, and reactive to light.  Neck: Normal range of motion. Neck supple.  Cardiovascular: Normal rate, regular rhythm, normal heart sounds and intact distal pulses.   Pulmonary/Chest: Effort normal and breath sounds normal.  Abdominal: Soft. Bowel sounds are normal.  Neurological: She is alert and oriented to person, place, and time. She has normal reflexes.  Skin: Skin is warm and dry. No rash noted.  Psychiatric: She has a normal mood and affect. Her behavior is normal. Judgment and thought content normal.  Nursing note and vitals reviewed.       Assessment & Plan:   1. Other migraine without status migrainosus, not intractable - ketorolac (TORADOL) injection 60 mg; Inject 2 mLs (60 mg total) into the muscle once. - butalbital-acetaminophen-caffeine (FIORICET, ESGIC) 50-325-40 MG tablet; Take 1 tablet by mouth every 6 (six) hours as needed for headache or migraine.  Dispense: 20 tablet; Refill: 0   Continue all other maintenance medications as listed above.  Follow up plan: Return if symptoms worsen or fail to improve.  Educational handout given for migraine  Remus Loffler PA-C [facility] [address]  [place], [place] [address] [phone]   02/09/2017, 10:02 PM

## 2017-02-10 ENCOUNTER — Other Ambulatory Visit: Payer: Self-pay | Admitting: Family Medicine

## 2017-02-10 ENCOUNTER — Ambulatory Visit (HOSPITAL_COMMUNITY)
Admission: RE | Admit: 2017-02-10 | Discharge: 2017-02-10 | Disposition: A | Discharge status: Home / Self Care | Payer: Medicare Other | Source: Ambulatory Visit | Attending: Family Medicine | Admitting: Family Medicine

## 2017-02-10 ENCOUNTER — Telehealth: Payer: Self-pay | Admitting: Family Medicine

## 2017-02-10 DIAGNOSIS — R519 Headache, unspecified: Secondary | ICD-10-CM

## 2017-02-10 DIAGNOSIS — I6782 Cerebral ischemia: Secondary | ICD-10-CM | POA: Diagnosis not present

## 2017-02-10 DIAGNOSIS — G319 Degenerative disease of nervous system, unspecified: Secondary | ICD-10-CM | POA: Insufficient documentation

## 2017-02-10 DIAGNOSIS — R51 Headache: Principal | ICD-10-CM

## 2017-02-10 MED ORDER — HYDROXYZINE HCL 25 MG PO TABS: 25.0000 mg | ORAL_TABLET | Freq: Four times a day (QID) | ORAL | 5 refills | Status: DC | PRN

## 2017-02-10 NOTE — Telephone Encounter (Signed)
Pt aware by VM  

## 2017-02-10 NOTE — Telephone Encounter (Signed)
I sent in the requested prescription 

## 2017-02-10 NOTE — Telephone Encounter (Signed)
Please contact the patient based on her symptoms I think she would be better served by having an MRI unless she has some type of metal prosthetic device that I'm unaware of. I went ahead and ordered an MRI and would like it set up stat.

## 2017-02-10 NOTE — Telephone Encounter (Signed)
Pt aware MRI has been ordered Pt wanting to know if Rx can be called in for her nerves Pt uses [facility]

## 2017-02-12 ENCOUNTER — Ambulatory Visit (INDEPENDENT_AMBULATORY_CARE_PROVIDER_SITE_OTHER): Payer: Medicare Other | Admitting: Family Medicine

## 2017-02-12 VITALS — BP 137/68 | HR 76 | Temp 98.1°F | Ht 62.0 in | Wt 151.0 lb

## 2017-02-12 DIAGNOSIS — F418 Other specified anxiety disorders: Secondary | ICD-10-CM | POA: Diagnosis not present

## 2017-02-12 DIAGNOSIS — G43919 Migraine, unspecified, intractable, without status migrainosus: Secondary | ICD-10-CM

## 2017-02-12 DIAGNOSIS — I481 Persistent atrial fibrillation: Secondary | ICD-10-CM

## 2017-02-12 DIAGNOSIS — E7800 Pure hypercholesterolemia, unspecified: Secondary | ICD-10-CM

## 2017-02-12 DIAGNOSIS — I4819 Other persistent atrial fibrillation: Secondary | ICD-10-CM

## 2017-02-12 LAB — CMP14+EGFR
A/G RATIO: 1.6 (ref 1.2–2.2)
ALK PHOS: 70 IU/L (ref 39–117)
ALT: 36 IU/L — AB (ref 0–32)
AST: 34 IU/L (ref 0–40)
Albumin: 4.4 g/dL (ref 3.5–4.8)
BILIRUBIN TOTAL: 0.5 mg/dL (ref 0.0–1.2)
BUN/Creatinine Ratio: 19 (ref 12–28)
BUN: 17 mg/dL (ref 8–27)
CHLORIDE: 101 mmol/L (ref 96–106)
CO2: 27 mmol/L (ref 20–29)
Calcium: 10.1 mg/dL (ref 8.7–10.3)
Creatinine, Ser: 0.89 mg/dL (ref 0.57–1.00)
GFR calc non Af Amer: 62 mL/min/{1.73_m2} (ref >59)
GFR, EST AFRICAN AMERICAN: 72 mL/min/{1.73_m2} (ref >59)
GLUCOSE: 86 mg/dL (ref 65–99)
Globulin, Total: 2.7 g/dL (ref 1.5–4.5)
POTASSIUM: 4.4 mmol/L (ref 3.5–5.2)
Sodium: 142 mmol/L (ref 134–144)
TOTAL PROTEIN: 7.1 g/dL (ref 6.0–8.5)

## 2017-02-12 LAB — CBC WITH DIFFERENTIAL/PLATELET
BASOS ABS: 0 10*3/uL (ref 0.0–0.2)
Basos: 0 %
EOS (ABSOLUTE): 0.1 10*3/uL (ref 0.0–0.4)
Eos: 2 %
Hematocrit: 44.6 % (ref 34.0–46.6)
Hemoglobin: 15.1 g/dL (ref 11.1–15.9)
IMMATURE GRANS (ABS): 0 10*3/uL (ref 0.0–0.1)
Immature Granulocytes: 0 %
LYMPHS: 29 %
Lymphocytes Absolute: 1.7 10*3/uL (ref 0.7–3.1)
MCH: 31.9 pg (ref 26.6–33.0)
MCHC: 33.9 g/dL (ref 31.5–35.7)
MCV: 94 fL (ref 79–97)
MONOS ABS: 0.3 10*3/uL (ref 0.1–0.9)
Monocytes: 5 %
NEUTROS ABS: 3.7 10*3/uL (ref 1.4–7.0)
NEUTROS PCT: 64 %
Platelets: 208 10*3/uL (ref 150–379)
RBC: 4.74 x10E6/uL (ref 3.77–5.28)
RDW: 13.5 % (ref 12.3–15.4)
WBC: 5.9 10*3/uL (ref 3.4–10.8)

## 2017-02-12 LAB — LIPID PANEL
CHOLESTEROL TOTAL: 221 mg/dL — AB (ref 100–199)
Chol/HDL Ratio: 5.1 ratio — ABNORMAL HIGH (ref 0.0–4.4)
HDL: 43 mg/dL (ref >39)
LDL Calculated: 145 mg/dL — ABNORMAL HIGH (ref 0–99)
TRIGLYCERIDES: 166 mg/dL — AB (ref 0–149)
VLDL CHOLESTEROL CAL: 33 mg/dL (ref 5–40)

## 2017-02-12 LAB — SEDIMENTATION RATE: Sed Rate: 2 mm/hr (ref 0–40)

## 2017-02-12 MED ORDER — CETIRIZINE HCL 10 MG PO TABS: 10.0000 mg | ORAL_TABLET | Freq: Every day | ORAL | 3 refills | Status: DC

## 2017-02-12 MED ORDER — TOPIRAMATE 25 MG PO TABS: 25.0000 mg | ORAL_TABLET | Freq: Every day | ORAL | 0 refills | Status: DC

## 2017-02-12 NOTE — Patient Instructions (Signed)
Take zyrtec daily to help with the itch. Increase the topiramate weekly as directed on the label.  Take hydroxyzine when the itch gets moderately severe.  Take Fioricet when the pain is intolerable.

## 2017-02-12 NOTE — Progress Notes (Signed)
Subjective:  Patient ID: Kelly [name], female    DOB: 05-Jul-1941  Age: 76 y.o. MRN: [id number]  CC: Results (pt here today to discuss MRI results )   HPI Kelly [name] presents for Headaches ongoing for several months. She had bouts of migraines as a young lady. She hasn't had any for several years until recently. She was treated last week for this and asked for an MRI to be performed. She describes the pain now is a moderately severe pressure that leads to an itch on the crown that spreads all over her head and eventually to the entire body. This will go on for days at a time. It's been persistent for over 2 months. She's been concerned that perhaps there was something in the brain such as bleeding due to her use of Eliquis for her atrial fibrillation. However the MRI as reviewed today reveals no sign of bleeding tumor aneurysm or other source for her pain. There is no focal lateralizing change. The itching that goes all over her body is not lateralizing.   History Mata has a past medical history of Depression with anxiety; Dysrhythmia; HLD (hyperlipidemia); HTN (hypertension); Migraine; NICM (nonischemic cardiomyopathy) ([place]); and Pulmonary HTN ([place]).   She has a past surgical history that includes Abdominal hysterectomy; Cholecystectomy; Appendectomy; ir generic historical (05/13/2016); ir generic historical (05/13/2016); Eye surgery (Bilateral); and Cardioversion (N/A, 07/05/2016).   Her family history includes Asthma in her mother; COPD in her mother; Cancer in her sister; Heart attack in her brother; Heart disease in her brother and mother; Thrombosis in her father.She reports that she has never smoked. She has never used smokeless tobacco. She reports that she does not drink alcohol or use drugs.    ROS Review of Systems  Constitutional: Negative for activity change, appetite change and fever.  HENT: Negative for congestion, rhinorrhea and sore throat.   Eyes: Negative for visual  disturbance.  Respiratory: Negative for cough and shortness of breath.   Cardiovascular: Negative for chest pain and palpitations.  Gastrointestinal: Negative for abdominal pain, diarrhea and nausea.  Genitourinary: Negative for dysuria.  Musculoskeletal: Negative for arthralgias and myalgias.  Neurological: Positive for dizziness, light-headedness and headaches.    Objective:  BP 137/68   Pulse 76   Temp 98.1 F (36.7 C) (Oral)   Ht 5' 2"  (1.575 m)   Wt 151 lb (68.5 kg)   BMI 27.62 kg/m   BP Readings from Last 3 Encounters:  02/12/17 137/68  02/07/17 (!) 111/57  09/12/16 135/74    Wt Readings from Last 3 Encounters:  02/12/17 151 lb (68.5 kg)  02/07/17 149 lb (67.6 kg)  09/12/16 145 lb (65.8 kg)     Physical Exam  Constitutional: She is oriented to person, place, and time. She appears well-developed and well-nourished. No distress.  HENT:  Head: Normocephalic and atraumatic.  Right Ear: External ear normal.  Left Ear: External ear normal.  Nose: Nose normal.  Mouth/Throat: Oropharynx is clear and moist.  Eyes: Conjunctivae and EOM are normal. Pupils are equal, round, and reactive to light.  Neck: Normal range of motion. Neck supple. No thyromegaly present.  Cardiovascular: Normal rate, regular rhythm and normal heart sounds.   No murmur heard. Pulmonary/Chest: Effort normal and breath sounds normal. No respiratory distress. She has no wheezes. She has no rales.  Abdominal: Soft. Bowel sounds are normal. She exhibits no distension. There is no tenderness.  Lymphadenopathy:    She has no cervical adenopathy.  Neurological: She  is alert and oriented to person, place, and time. She has normal reflexes.  Skin: Skin is warm and dry.  Psychiatric: She has a normal mood and affect. Her behavior is normal. Judgment and thought content normal.      Assessment & Plan:   Deserae was seen today for results.  Diagnoses and all orders for this visit:  Pure  hypercholesterolemia -     CBC with Differential/Platelet -     CMP14+EGFR -     Lipid panel  Depression with anxiety -     CBC with Differential/Platelet -     CMP14+EGFR  Intractable migraine without status migrainosus, unspecified migraine type -     CBC with Differential/Platelet -     CMP14+EGFR -     Sedimentation rate  Persistent atrial fibrillation (HCC) -     CBC with Differential/Platelet -     CMP14+EGFR  Other orders -     topiramate (TOPAMAX) 25 MG tablet; Take 1 tablet (25 mg total) by mouth daily. Take 1 daily for 1 week then 2 daily for 1 week then 3 daily for 1 week then 4 daily. Take them together at bedtime -     cetirizine (ZYRTEC) 10 MG tablet; Take 1 tablet (10 mg total) by mouth daily. For allergy symptoms       I am having Ms. Barreira start on topiramate and cetirizine. I am also having her maintain her apixaban, furosemide, metoprolol succinate, clonazePAM, butalbital-acetaminophen-caffeine, and hydrOXYzine.  Allergies as of 02/12/2017      Reactions   Codeine Nausea And Vomiting   Simvastatin Cough   Made her cough      Medication List       Accurate as of 02/12/17 11:59 PM. Always use your most recent med list.          apixaban 5 MG Tabs tablet Commonly known as:  ELIQUIS Take 1 tablet (5 mg total) by mouth 2 (two) times daily.   butalbital-acetaminophen-caffeine 50-325-40 MG tablet Commonly known as:  FIORICET, ESGIC Take 1 tablet by mouth every 6 (six) hours as needed for headache or migraine.   cetirizine 10 MG tablet Commonly known as:  ZYRTEC Take 1 tablet (10 mg total) by mouth daily. For allergy symptoms   clonazePAM 0.5 MG tablet Commonly known as:  KLONOPIN TAKE ONE TABLET BY MOUTH ONCE DAILY AS NEEDED FOR ANXIETY   furosemide 40 MG tablet Commonly known as:  LASIX TAKE ONE TABLET BY MOUTH ONCE DAILY   hydrOXYzine 25 MG tablet Commonly known as:  ATARAX/VISTARIL Take 1 tablet (25 mg total) by mouth every 6 (six)  hours as needed for itching.   meclizine 12.5 MG tablet Commonly known as:  ANTIVERT TAKE ONE TABLET BY MOUTH EVERY 12 TO 24 HOURS AS NEEDED   metoprolol succinate 25 MG 24 hr tablet Commonly known as:  TOPROL-XL TAKE ONE TABLET BY MOUTH ONCE DAILY   topiramate 25 MG tablet Commonly known as:  TOPAMAX Take 1 tablet (25 mg total) by mouth daily. Take 1 daily for 1 week then 2 daily for 1 week then 3 daily for 1 week then 4 daily. Take them together at bedtime        Follow-up: Return in about 4 weeks (around 03/12/2017) for headache.  Claretta Fraise, M.D.

## 2017-02-13 ENCOUNTER — Other Ambulatory Visit: Payer: Self-pay | Admitting: Nurse Practitioner

## 2017-02-17 NOTE — Progress Notes (Signed)
Cardiology Office Note   Date:  02/18/2017   ID:  Kelly [name], DOB Jun 27, 1941, MRN [id number]  PCP:  Kelly Claude, MD  Cardiologist:   Kelly Rotunda, MD  Referring:  Kelly Claude, MD  Chief Complaint  Patient presents with  . Atrial Fibrillation     History of Present Illness: Kelly [name] is a 76 y.o. female who presents for follow up of NICM.  She has a history of normal coronary arteries by catheterization in 2002. Echocardiogram in 2015 demonstrated normal LV function with mild mitral regurgitation. Myoview in 2013 demonstrated normal perfusion.  Echo  Has demonstrated an EF of 35%.  She has had atrial fib with rapid rate. She was cardioverted in November of 2017.  Her last echo in May 2018 demonstrated that her EF was back to 40% and pulmonary pressures, which had been elevated, were now normal.  She presents for follow up.  The patient denies any new symptoms such as chest discomfort, neck or arm discomfort. There has been no new shortness of breath, PND or orthopnea. There have been no reported palpitations, presyncope or syncope.  Past Medical History:  Diagnosis Date  . Depression with anxiety   . Dysrhythmia    AFib  . HLD (hyperlipidemia)   . HTN (hypertension)   . Migraine   . NICM (nonischemic cardiomyopathy) (HCC)    a. LHC 8/02: EF 55%, 2+ MR, normal cors;  b.  Echo 3/10: EF 50-55%, mild diast dysfn, mild MVP (ant leaflet), mild MR, mild TR, small pericard eff.;   c.  Myoview 3/10:  EF 69%, no ischemia;  d. Echo 9/13: EF 50-55%, Gr 1 diast dysfn, mild prolapse of ant MV leaflet;   e. Myoview 9/13: EF 64%, NL perfusion;  f.  Echo (11/15):  EF 50-55%, normal diastolic function, mild MR  . Pulmonary HTN (HCC)     Past Surgical History:  Procedure Laterality Date  . ABDOMINAL HYSTERECTOMY    . APPENDECTOMY    . CARDIOVERSION N/A 07/05/2016   Procedure: CARDIOVERSION;  Surgeon: Kelly Sidle, MD;  Location: [facility];  Service: Cardiovascular;   Laterality: N/A;  . CHOLECYSTECTOMY    . EYE SURGERY Bilateral    cataract removal with lens implants  . IR GENERIC HISTORICAL  05/13/2016   IR RADIOLOGY PERIPHERAL GUIDED IV START 05/13/2016 Malachy Moan, MD [facility]  . IR GENERIC HISTORICAL  05/13/2016   IR US GUIDE VASC ACCESS RIGHT 05/13/2016 Malachy Moan, MD [facility]     Current Outpatient Prescriptions  Medication Sig Dispense Refill  . apixaban (ELIQUIS) 5 MG TABS tablet Take 1 tablet (5 mg total) by mouth 2 (two) times daily. 28 tablet 0  . butalbital-acetaminophen-caffeine (FIORICET, ESGIC) 50-325-40 MG tablet Take 1 tablet by mouth every 6 (six) hours as needed for headache or migraine. 20 tablet 0  . cetirizine (ZYRTEC) 10 MG tablet Take 1 tablet (10 mg total) by mouth daily. For allergy symptoms 90 tablet 3  . clonazePAM (KLONOPIN) 0.5 MG tablet TAKE ONE TABLET BY MOUTH ONCE DAILY AS NEEDED FOR ANXIETY 30 tablet 0  . furosemide (LASIX) 40 MG tablet TAKE ONE TABLET BY MOUTH ONCE DAILY 90 tablet 1  . hydrOXYzine (ATARAX/VISTARIL) 25 MG tablet Take 1 tablet (25 mg total) by mouth every 6 (six) hours as needed for itching. 30 tablet 5  . meclizine (ANTIVERT) 12.5 MG tablet TAKE 1 TABLET BY MOUTH EVERY 12-24 HOURS AS NEEDED 40 tablet 2  . metoprolol  succinate (TOPROL-XL) 25 MG 24 hr tablet TAKE ONE TABLET BY MOUTH ONCE DAILY 30 tablet 6  . topiramate (TOPAMAX) 25 MG tablet Take 1 tablet (25 mg total) by mouth daily. Take 1 daily for 1 week then 2 daily for 1 week then 3 daily for 1 week then 4 daily. Take them together at bedtime 120 tablet 0   No current facility-administered medications for this visit.     Allergies:   Codeine and Simvastatin    ROS:  Please see the history of present illness.   Otherwise, review of systems are positive for none.   All other systems are reviewed and negative.    PHYSICAL EXAM: VS:  BP 122/74   Pulse 78   Ht 5\' 2"  (1.575 m)   Wt 147 lb (66.7 kg)   BMI 26.89 kg/m  , BMI Body  mass index is 26.89 kg/m.  GENERAL:  Well appearing NECK:  No jugular venous distention, waveform within normal limits, carotid upstroke brisk and symmetric, no bruits, no thyromegalyy LUNGS:  Clear to auscultation bilaterally BACK:  No CVA tenderness CHEST:  Unremarkable HEART:  PMI not displaced or sustained,S1 and S2 within normal limits, no S3, no S4, no clicks, no rubs, no murmurs ABD:  Flat, positive bowel sounds normal in frequency in pitch, no bruits, no rebound, no guarding, no midline pulsatile mass, no hepatomegaly, no splenomegaly EXT:  2 plus pulses throughout, no edema, no cyanosis no clubbing    EKG:  EKG is  ordered today. Sinus rhythm, rate 78, axis within normal limits, intervals within normal limits, premature ectopic complex, low voltage in the limb and chest leads unchanged change from previous.   Recent Labs: 07/03/2016: TSH 2.377 02/12/2017: ALT 36; BUN 17; Creatinine, Ser 0.89; Hemoglobin 15.1; Platelets 208; Potassium 4.4; Sodium 142    Lipid Panel    Component Value Date/Time   CHOL 221 (H) 02/12/2017 0920   TRIG 166 (H) 02/12/2017 0920   HDL 43 02/12/2017 0920   CHOLHDL 5.1 (H) 02/12/2017 0920   CHOLHDL 3 10/22/2010 0829   VLDL 14.6 10/22/2010 0829   LDLCALC 145 (H) 02/12/2017 0920   LDLDIRECT 151.3 08/16/2010 1118      Wt Readings from Last 3 Encounters:  02/18/17 147 lb (66.7 kg)  02/12/17 151 lb (68.5 kg)  02/07/17 149 lb (67.6 kg)      Other studies Reviewed: Additional studies/ records that were reviewed today include:  None Review of the above records demonstrates:     ASSESSMENT AND PLAN:  HTN:   The blood pressure is at target. No change in medications is indicated. We will continue with therapeutic lifestyle changes (TLC).  CM:  EF is mildly reduced and better than previous.  She will continue meds as listed.  ATRIAL FIB:     [name] has a CHA2DS2 - VASc score of 4 with a risk of stroke of 4%.   She is status post  cardioversion.  She tolerates anticoagulation and will continue.   PULM HTN:  This is stable or improved.  No change in therapy.   Current medicines are reviewed at length with the patient today.  The patient does not have concerns regarding medicines.  The following changes have been made:  None  Labs/ tests ordered today include:     No orders of the defined types were placed in this encounter.    Disposition:   FU with me in in one year.  Signed, Kelly Rotunda, MD  02/18/2017 1:55 PM    [facility]

## 2017-02-18 ENCOUNTER — Ambulatory Visit (INDEPENDENT_AMBULATORY_CARE_PROVIDER_SITE_OTHER): Payer: Medicare Other | Admitting: Cardiology

## 2017-02-18 ENCOUNTER — Encounter: Payer: Self-pay | Admitting: Cardiology

## 2017-02-18 VITALS — BP 122/74 | HR 78 | Ht 62.0 in | Wt 147.0 lb

## 2017-02-18 DIAGNOSIS — I272 Pulmonary hypertension, unspecified: Secondary | ICD-10-CM

## 2017-02-18 DIAGNOSIS — I4819 Other persistent atrial fibrillation: Secondary | ICD-10-CM

## 2017-02-18 DIAGNOSIS — I10 Essential (primary) hypertension: Secondary | ICD-10-CM | POA: Insufficient documentation

## 2017-02-18 DIAGNOSIS — I420 Dilated cardiomyopathy: Secondary | ICD-10-CM | POA: Diagnosis not present

## 2017-02-18 DIAGNOSIS — I481 Persistent atrial fibrillation: Secondary | ICD-10-CM

## 2017-02-18 NOTE — Patient Instructions (Signed)
Dr Hochrein recommends that you schedule a follow-up appointment in 12 months. You will receive a reminder letter in the mail two months in advance. If you don't receive a letter, please call our office to schedule the follow-up appointment.  If you need a refill on your cardiac medications before your next appointment, please call your pharmacy. 

## 2017-02-21 ENCOUNTER — Other Ambulatory Visit: Payer: Self-pay | Admitting: Physician Assistant

## 2017-02-21 ENCOUNTER — Other Ambulatory Visit: Payer: Self-pay | Admitting: Cardiology

## 2017-02-21 DIAGNOSIS — G43809 Other migraine, not intractable, without status migrainosus: Secondary | ICD-10-CM

## 2017-02-22 ENCOUNTER — Telehealth: Payer: Self-pay | Admitting: Family Medicine

## 2017-02-22 NOTE — Telephone Encounter (Signed)
Pt aware.

## 2017-02-22 NOTE — Telephone Encounter (Signed)
Pt can take benadryl as needed. She will need to make appt for Monday and follow up.

## 2017-02-22 NOTE — Telephone Encounter (Signed)
What symptoms do you have? Itching, she was up all night, has tried ice packs   How long have you been sick? Couple days  Have you been seen for this problem? Yes last week, was prescribed hydrOXYzine (ATARAX/VISTARIL) 25 MG tablet and it is not working  If your provider decides to give you a prescription, which pharmacy would you like for it to be sent to? [facility] [place]   Patient informed that this information will be sent to the clinical staff for review and that they should receive a follow up call.

## 2017-02-24 ENCOUNTER — Other Ambulatory Visit: Payer: Self-pay | Admitting: *Deleted

## 2017-02-24 DIAGNOSIS — G43809 Other migraine, not intractable, without status migrainosus: Secondary | ICD-10-CM

## 2017-02-24 NOTE — Telephone Encounter (Signed)
She had a paper Rx for #20 tabs printed and given to her at 6/22 appt. Did she ever have that filled? She should still have it if not. It is too early to get a new Rx.

## 2017-02-24 NOTE — Telephone Encounter (Signed)
Pt did have Rx from 6/22 filled Pt aware too early for refill  She was not happy with this Suggested she be seen again since she has had HA's frequently since her 6/22 visit for her to be out of medication That this Rx may not be working for her She will call back

## 2017-03-12 ENCOUNTER — Ambulatory Visit: Payer: Medicare Other | Admitting: Family Medicine

## 2017-03-20 ENCOUNTER — Other Ambulatory Visit: Payer: Self-pay | Admitting: Cardiology

## 2017-04-02 ENCOUNTER — Other Ambulatory Visit: Payer: Self-pay | Admitting: Family Medicine

## 2017-04-03 NOTE — Telephone Encounter (Signed)
Refill called to [facility] 

## 2017-04-18 ENCOUNTER — Encounter: Payer: Self-pay | Admitting: Family Medicine

## 2017-04-18 DIAGNOSIS — Z1231 Encounter for screening mammogram for malignant neoplasm of breast: Secondary | ICD-10-CM | POA: Diagnosis not present

## 2017-04-19 DIAGNOSIS — G3184 Mild cognitive impairment, so stated: Secondary | ICD-10-CM

## 2017-04-19 HISTORY — DX: Mild cognitive impairment of uncertain or unknown etiology: G31.84

## 2017-04-23 ENCOUNTER — Other Ambulatory Visit: Payer: Self-pay

## 2017-04-23 MED ORDER — APIXABAN 5 MG PO TABS: 5.0000 mg | ORAL_TABLET | Freq: Two times a day (BID) | ORAL | 2 refills | Status: DC

## 2017-04-24 DIAGNOSIS — Z1283 Encounter for screening for malignant neoplasm of skin: Secondary | ICD-10-CM | POA: Diagnosis not present

## 2017-04-24 DIAGNOSIS — L820 Inflamed seborrheic keratosis: Secondary | ICD-10-CM | POA: Diagnosis not present

## 2017-04-24 DIAGNOSIS — B078 Other viral warts: Secondary | ICD-10-CM | POA: Diagnosis not present

## 2017-04-24 DIAGNOSIS — L821 Other seborrheic keratosis: Secondary | ICD-10-CM | POA: Diagnosis not present

## 2017-04-25 ENCOUNTER — Other Ambulatory Visit: Payer: Self-pay | Admitting: *Deleted

## 2017-04-25 ENCOUNTER — Other Ambulatory Visit: Payer: Self-pay

## 2017-04-25 MED ORDER — APIXABAN 5 MG PO TABS: 5.0000 mg | ORAL_TABLET | Freq: Two times a day (BID) | ORAL | 2 refills | Status: DC

## 2017-04-25 MED ORDER — METOPROLOL SUCCINATE ER 25 MG PO TB24: 25.0000 mg | ORAL_TABLET | Freq: Every day | ORAL | 2 refills | Status: AC

## 2017-04-25 NOTE — Telephone Encounter (Signed)
Rx has been sent to the pharmacy electronically. ° °

## 2017-05-05 ENCOUNTER — Telehealth: Payer: Self-pay | Admitting: Cardiology

## 2017-05-05 MED ORDER — APIXABAN 5 MG PO TABS: 5.0000 mg | ORAL_TABLET | Freq: Two times a day (BID) | ORAL | 0 refills | Status: AC

## 2017-05-05 NOTE — Telephone Encounter (Signed)
SAMPLES AVAILABLE FOR PICK UP AS WELL AS PATIENT ASSISTANCE FORM TO FILL OUT HUSBAND VOICE AWARENESS. HE WILL ALSO CONTACT INSURANCE TO SEE WHAT IS LOWER TIER.

## 2017-05-05 NOTE — Telephone Encounter (Signed)
New message     Patient calling the office for samples of medication:   1.  What medication and dosage are you requesting samples for?  eliquis 5mg  bid  2.  Are you currently out of this medication? Pt is in the donut hole and cannot afford medication.  She will take discount cards or samples

## 2017-05-06 ENCOUNTER — Other Ambulatory Visit: Payer: Self-pay

## 2017-05-06 MED ORDER — TOPIRAMATE 25 MG PO TABS: 25.0000 mg | ORAL_TABLET | Freq: Every day | ORAL | 0 refills | Status: DC

## 2017-05-12 ENCOUNTER — Ambulatory Visit (INDEPENDENT_AMBULATORY_CARE_PROVIDER_SITE_OTHER): Payer: Medicare Other | Admitting: Family Medicine

## 2017-05-12 ENCOUNTER — Encounter: Payer: Self-pay | Admitting: Family Medicine

## 2017-05-12 VITALS — BP 125/74 | HR 76 | Temp 96.8°F | Ht 62.0 in | Wt 150.0 lb

## 2017-05-12 DIAGNOSIS — G43919 Migraine, unspecified, intractable, without status migrainosus: Secondary | ICD-10-CM | POA: Diagnosis not present

## 2017-05-12 DIAGNOSIS — I10 Essential (primary) hypertension: Secondary | ICD-10-CM

## 2017-05-12 NOTE — Progress Notes (Signed)
Subjective:  Patient ID: Kelly [name], female    DOB: [birth year]  Age: 76 y.o. MRN: [id number]  CC: Headache (pt here today c/o headaches still and seems a bit confused today. She doesn't remember discuss the MRI at her last visit or being told over the phone the results which are both documented in her chart.)   HPI Kelly [name] presents for Headache 2 mornings week. It's there when she wakes up. She feels a pressure like there is a Over her scalp. This will last few minutes and then she'll start feeling itching all over her scalp. She takes a couple of Tylenol Extra Strength and that relieves it usually. Occasionally she'll use ibuprofen but she is concerned because her son tells her that it hurt his liver.Patient does not remember the office visit documented previously where her MRI was discussed.She says that she has an anti-itch medicine at home that helps some with the itching referring apparently either to hydroxyzine or cetirizine.   Depression screen [facility] 2/9 05/12/2017 02/12/2017 02/07/2017  Decreased Interest 0 0 0  Down, Depressed, Hopeless 0 0 0  PHQ - 2 Score 0 0 0  Altered sleeping - - -  Tired, decreased energy - - -  Change in appetite - - -  Feeling bad or failure about yourself  - - -  Trouble concentrating - - -  Moving slowly or fidgety/restless - - -  Suicidal thoughts - - -  PHQ-9 Score - - -    History Kelly [name] has a past medical history of Depression with anxiety; Dysrhythmia; HLD (hyperlipidemia); HTN (hypertension); Migraine; NICM (nonischemic cardiomyopathy) (HCC); and Pulmonary HTN (HCC).   She has a past surgical history that includes Abdominal hysterectomy; Cholecystectomy; Appendectomy; ir generic historical (05/13/2016); ir generic historical (05/13/2016); Eye surgery (Bilateral); and Cardioversion (N/A, 07/05/2016).   Her family history includes Asthma in her mother; COPD in her mother; Cancer in her sister; Heart attack in her brother; Heart disease in  her brother and mother; Thrombosis in her father.She reports that she has never smoked. She has never used smokeless tobacco. She reports that she does not drink alcohol or use drugs.    ROS Review of Systems  Constitutional: Negative for activity change, appetite change and fever.  HENT: Negative for congestion, rhinorrhea and sore throat.   Eyes: Negative for visual disturbance.  Respiratory: Negative for cough and shortness of breath.   Cardiovascular: Negative for chest pain and palpitations.  Gastrointestinal: Negative for abdominal pain, diarrhea and nausea.  Genitourinary: Negative for dysuria.  Musculoskeletal: Negative for arthralgias and myalgias.    Objective:  BP 125/74   Pulse 76   Temp (!) 96.8 F (36 C) (Oral)   Ht 5\' 2"  (1.575 m)   Wt 150 lb (68 kg)   BMI 27.44 kg/m   BP Readings from Last 3 Encounters:  05/12/17 125/74  02/18/17 122/74  02/12/17 137/68    Wt Readings from Last 3 Encounters:  05/12/17 150 lb (68 kg)  02/18/17 147 lb (66.7 kg)  02/12/17 151 lb (68.5 kg)     Physical Exam  Constitutional: She is oriented to person, place, and time. She appears well-developed and well-nourished. No distress.  HENT:  Head: Normocephalic and atraumatic.  Eyes: Pupils are equal, round, and reactive to light. Conjunctivae are normal.  Neck: Normal range of motion. Neck supple. No thyromegaly present.  Cardiovascular: Normal rate, regular rhythm and normal heart sounds.   No murmur heard. Pulmonary/Chest: Effort normal and  breath sounds normal. No respiratory distress. She has no wheezes. She has no rales.  Abdominal: Soft. Bowel sounds are normal. She exhibits no distension. There is no tenderness.  Musculoskeletal: Normal range of motion.  Lymphadenopathy:    She has no cervical adenopathy.  Neurological: She is alert and oriented to person, place, and time.  Skin: Skin is warm and dry.  Psychiatric: She has a normal mood and affect. Her speech is  normal and behavior is normal. Thought content normal. Cognition and memory are impaired (early impairment).      Assessment & Plan:   Kelly [name] was seen today for headache.  Diagnoses and all orders for this visit:  Intractable migraine without status migrainosus, unspecified migraine type  Essential hypertension    The memory is perhaps minimally impaired but certainly not to the point of medication. It may become necessary to talk her into not taking the clonazepam and/or hydroxyzine as well as the Zyrtec. However they do seem to relieve her itching and anxiety symptoms.   I am having Kelly [name] maintain her butalbital-acetaminophen-caffeine, hydrOXYzine, cetirizine, meclizine, furosemide, clonazePAM, metoprolol succinate, apixaban, and topiramate.  Allergies as of 05/12/2017      Reactions   Codeine Nausea And Vomiting   Simvastatin Cough   Made her cough      Medication List       Accurate as of 05/12/17  3:32 PM. Always use your most recent med list.          apixaban 5 MG Tabs tablet Commonly known as:  ELIQUIS Take 1 tablet (5 mg total) by mouth 2 (two) times daily.   butalbital-acetaminophen-caffeine 50-325-40 MG tablet Commonly known as:  FIORICET, ESGIC Take 1 tablet by mouth every 6 (six) hours as needed for headache or migraine.   cetirizine 10 MG tablet Commonly known as:  ZYRTEC Take 1 tablet (10 mg total) by mouth daily. For allergy symptoms   clonazePAM 0.5 MG tablet Commonly known as:  KLONOPIN TAKE 1 TABLET BY MOUTH ONCE DAILY AS NEEDED   furosemide 40 MG tablet Commonly known as:  LASIX TAKE ONE TABLET BY MOUTH ONCE DAILY   hydrOXYzine 25 MG tablet Commonly known as:  ATARAX/VISTARIL Take 1 tablet (25 mg total) by mouth every 6 (six) hours as needed for itching.   meclizine 12.5 MG tablet Commonly known as:  ANTIVERT TAKE 1 TABLET BY MOUTH EVERY 12-24 HOURS AS NEEDED   metoprolol succinate 25 MG 24 hr tablet Commonly known as:   TOPROL-XL Take 1 tablet (25 mg total) by mouth daily.   topiramate 25 MG tablet Commonly known as:  TOPAMAX Take 1 tablet (25 mg total) by mouth daily. Take 1 daily for 1 week then 2 daily for 1 week then 3 daily for 1 week then 4 daily. Take them together at bedtime        Follow-up: Return in about 3 months (around 08/11/2017).  Mechele Claude, M.D.

## 2017-05-13 ENCOUNTER — Telehealth: Payer: Self-pay | Admitting: Family Medicine

## 2017-05-13 NOTE — Telephone Encounter (Signed)
Change was made, all other pharmacies other than [facility] were taken out of patient chart

## 2017-05-29 ENCOUNTER — Ambulatory Visit (INDEPENDENT_AMBULATORY_CARE_PROVIDER_SITE_OTHER): Payer: Medicare Other

## 2017-05-29 DIAGNOSIS — Z23 Encounter for immunization: Secondary | ICD-10-CM | POA: Diagnosis not present

## 2017-05-30 ENCOUNTER — Emergency Department (HOSPITAL_COMMUNITY)
Admission: EM | Admit: 2017-05-30 | Discharge: 2017-05-30 | Disposition: A | Discharge status: Home / Self Care | Payer: Medicare Other | Attending: Emergency Medicine | Admitting: Emergency Medicine

## 2017-05-30 ENCOUNTER — Emergency Department (HOSPITAL_COMMUNITY): Payer: Medicare Other

## 2017-05-30 ENCOUNTER — Encounter (HOSPITAL_COMMUNITY): Payer: Self-pay | Admitting: Emergency Medicine

## 2017-05-30 DIAGNOSIS — Z7901 Long term (current) use of anticoagulants: Secondary | ICD-10-CM | POA: Insufficient documentation

## 2017-05-30 DIAGNOSIS — I10 Essential (primary) hypertension: Secondary | ICD-10-CM | POA: Insufficient documentation

## 2017-05-30 DIAGNOSIS — Z79899 Other long term (current) drug therapy: Secondary | ICD-10-CM | POA: Insufficient documentation

## 2017-05-30 DIAGNOSIS — H81399 Other peripheral vertigo, unspecified ear: Secondary | ICD-10-CM | POA: Diagnosis not present

## 2017-05-30 DIAGNOSIS — R42 Dizziness and giddiness: Secondary | ICD-10-CM | POA: Diagnosis not present

## 2017-05-30 HISTORY — DX: Mild cognitive impairment, so stated: G31.84

## 2017-05-30 HISTORY — DX: Dizziness and giddiness: R42

## 2017-05-30 LAB — CBC
HCT: 45.2 % (ref 36.0–46.0)
Hemoglobin: 15.3 g/dL — ABNORMAL HIGH (ref 12.0–15.0)
MCH: 32.1 pg (ref 26.0–34.0)
MCHC: 33.8 g/dL (ref 30.0–36.0)
MCV: 94.8 fL (ref 78.0–100.0)
PLATELETS: 162 10*3/uL (ref 150–400)
RBC: 4.77 MIL/uL (ref 3.87–5.11)
RDW: 13.5 % (ref 11.5–15.5)
WBC: 5.5 10*3/uL (ref 4.0–10.5)

## 2017-05-30 LAB — DIFFERENTIAL
Basophils Absolute: 0 10*3/uL (ref 0.0–0.1)
Basophils Relative: 0 %
EOS PCT: 1 %
Eosinophils Absolute: 0.1 10*3/uL (ref 0.0–0.7)
LYMPHS PCT: 28 %
Lymphs Abs: 1.6 10*3/uL (ref 0.7–4.0)
MONO ABS: 0.3 10*3/uL (ref 0.1–1.0)
Monocytes Relative: 5 %
Neutro Abs: 3.6 10*3/uL (ref 1.7–7.7)
Neutrophils Relative %: 66 %

## 2017-05-30 LAB — COMPREHENSIVE METABOLIC PANEL
ALK PHOS: 52 U/L (ref 38–126)
ALT: 18 U/L (ref 14–54)
ANION GAP: 12 (ref 5–15)
AST: 24 U/L (ref 15–41)
Albumin: 4.1 g/dL (ref 3.5–5.0)
BILIRUBIN TOTAL: 1.4 mg/dL — AB (ref 0.3–1.2)
BUN: 16 mg/dL (ref 6–20)
CALCIUM: 9.3 mg/dL (ref 8.9–10.3)
CO2: 26 mmol/L (ref 22–32)
Chloride: 103 mmol/L (ref 101–111)
Creatinine, Ser: 0.98 mg/dL (ref 0.44–1.00)
GFR, EST NON AFRICAN AMERICAN: 54 mL/min — AB (ref >60)
Glucose, Bld: 89 mg/dL (ref 65–99)
Potassium: 3.6 mmol/L (ref 3.5–5.1)
SODIUM: 141 mmol/L (ref 135–145)
TOTAL PROTEIN: 6.8 g/dL (ref 6.5–8.1)

## 2017-05-30 LAB — TROPONIN I

## 2017-05-30 MED ORDER — MECLIZINE HCL 12.5 MG PO TABS
12.5000 mg | ORAL_TABLET | Freq: Once | ORAL | Status: AC
  Administered 2017-05-30: 12.5 mg via ORAL
  Filled 2017-05-30: qty 1

## 2017-05-30 MED ORDER — SODIUM CHLORIDE 0.9 % IV BOLUS (SEPSIS): 500.0000 mL | Freq: Once | INTRAVENOUS | Status: DC

## 2017-05-30 NOTE — ED Provider Notes (Signed)
[facility] DEPT Provider Note   CSN: [id number] Arrival date & time: 05/30/17  1349     History   Chief Complaint Chief Complaint  Patient presents with  . Dizziness    HPI Kelly [name] is a 76 y.o. female.  HPI  Pt was seen at 1510. Per pt and her family, c/o gradual onset and persistence of multiple intermittent episodes of "swimmy headedness" for the past 3 days. Has been associated with nausea and feeling "off balance." Pt describes her symptoms as "my vertigo." States she has been taking antivert "not very regularly" and without improvement. Denies CP/palpitations, no SOB/cough, no abd pain, no vomiting/diarrhea, no injury, no visual changes, no focal motor weakness, no tingling/numbness in extremities, no slurred speech, no facial droop.    Past Medical History:  Diagnosis Date  . Depression with anxiety   . Dysrhythmia    AFib  . HLD (hyperlipidemia)   . HTN (hypertension)   . Migraine   . Mild cognitive impairment 04/2017  . NICM (nonischemic cardiomyopathy) (HCC)    a. LHC 8/02: EF 55%, 2+ MR, normal cors;  b.  Echo 3/10: EF 50-55%, mild diast dysfn, mild MVP (ant leaflet), mild MR, mild TR, small pericard eff.;   c.  Myoview 3/10:  EF 69%, no ischemia;  d. Echo 9/13: EF 50-55%, Gr 1 diast dysfn, mild prolapse of ant MV leaflet;   e. Myoview 9/13: EF 64%, NL perfusion;  f.  Echo (11/15):  EF 50-55%, normal diastolic function, mild MR  . Pulmonary HTN (HCC)   . Vertigo     Patient Active Problem List   Diagnosis Date Noted  . Essential hypertension 02/18/2017  . Persistent atrial fibrillation (HCC)   . Skin lesion of left leg 06/03/2016  . Aortic atherosclerosis (HCC) 05/22/2016  . Dyspnea 05/10/2016  . Depression with anxiety 12/12/2010  . Pulmonary HTN (HCC) 12/12/2010  . Migraine 12/12/2010  . NICM (nonischemic cardiomyopathy) (HCC) 08/16/2010  . Pure hypercholesterolemia 07/21/2009    Past Surgical History:  Procedure Laterality Date  .  ABDOMINAL HYSTERECTOMY    . APPENDECTOMY    . CARDIOVERSION N/A 07/05/2016   Procedure: CARDIOVERSION;  Surgeon: Jonelle Sidle, MD;  Location: [facility];  Service: Cardiovascular;  Laterality: N/A;  . CHOLECYSTECTOMY    . EYE SURGERY Bilateral    cataract removal with lens implants  . IR GENERIC HISTORICAL  05/13/2016   IR RADIOLOGY PERIPHERAL GUIDED IV START 05/13/2016 Malachy Moan, MD [facility]  . IR GENERIC HISTORICAL  05/13/2016   IR US GUIDE VASC ACCESS RIGHT 05/13/2016 Malachy Moan, MD [facility]    OB History    No data available       Home Medications    Prior to Admission medications   Medication Sig Start Date End Date Taking? Authorizing Provider  apixaban (ELIQUIS) 5 MG TABS tablet Take 1 tablet (5 mg total) by mouth 2 (two) times daily. 05/05/17  Yes Rollene Rotunda, MD  butalbital-acetaminophen-caffeine (FIORICET, ESGIC) [id number] MG tablet Take 1 tablet by mouth every 6 (six) hours as needed for headache or migraine. 02/07/17  Yes Remus Loffler, PA-C  cetirizine (ZYRTEC) 10 MG tablet Take 1 tablet (10 mg total) by mouth daily. For allergy symptoms 02/12/17  Yes Stacks, Broadus John, MD  clonazePAM (KLONOPIN) 0.5 MG tablet TAKE 1 TABLET BY MOUTH ONCE DAILY AS NEEDED Patient taking differently: TAKE 0.5 MG TABLET BY MOUTH ONCE DAILY AS NEEDED FOR ANXIETY 04/02/17  Yes Mechele Claude, MD  furosemide (LASIX) 40 MG tablet TAKE ONE TABLET BY MOUTH ONCE DAILY Patient taking differently: TAKE 40 MG TABLET BY MOUTH ONCE DAILY 03/20/17  Yes Rollene Rotunda, MD  hydrOXYzine (ATARAX/VISTARIL) 25 MG tablet Take 1 tablet (25 mg total) by mouth every 6 (six) hours as needed for itching. 02/10/17  Yes Stacks, Broadus John, MD  meclizine (ANTIVERT) 12.5 MG tablet TAKE 1 TABLET BY MOUTH EVERY 12-24 HOURS AS NEEDED 02/14/17  Yes Mechele Claude, MD  metoprolol succinate (TOPROL-XL) 25 MG 24 hr tablet Take 1 tablet (25 mg total) by mouth daily. 04/25/17  Yes Rollene Rotunda, MD  topiramate  (TOPAMAX) 25 MG tablet Take 1 tablet (25 mg total) by mouth daily. Take 1 daily for 1 week then 2 daily for 1 week then 3 daily for 1 week then 4 daily. Take them together at bedtime Patient taking differently: Take 25 mg by mouth every morning. Take 1 daily for 1 week then 2 daily for 1 week then 3 daily for 1 week then 4 daily. Take them together at bedtime (original directions) 05/06/17  Yes Mechele Claude, MD    Family History Family History  Problem Relation Age of Onset  . Asthma Mother   . COPD Mother   . Heart disease Mother   . Thrombosis Father   . Cancer Sister   . Heart disease Brother   . Heart attack Brother   . Stroke Neg Hx     Social History Social History  Substance Use Topics  . Smoking status: Never Smoker  . Smokeless tobacco: Never Used  . Alcohol use No     Allergies   Patient has no known allergies.   Review of Systems Review of Systems ROS: Statement: All systems negative except as marked or noted in the HPI; Constitutional: Negative for fever and chills. ; ; Eyes: Negative for eye pain, redness and discharge. ; ; ENMT: Negative for ear pain, hoarseness, nasal congestion, sinus pressure and sore throat. ; ; Cardiovascular: Negative for chest pain, palpitations, diaphoresis, dyspnea and peripheral edema. ; ; Respiratory: Negative for cough, wheezing and stridor. ; ; Gastrointestinal: +nausea. Negative for vomiting, diarrhea, abdominal pain, blood in stool, hematemesis, jaundice and rectal bleeding. . ; ; Genitourinary: Negative for dysuria, flank pain and hematuria. ; ; Musculoskeletal: Negative for back pain and neck pain. Negative for swelling and trauma.; ; Skin: Negative for pruritus, rash, abrasions, blisters, bruising and skin lesion.; ; Neuro: +vertigo. Negative for headache, lightheadedness and neck stiffness. Negative for weakness, altered level of consciousness, altered mental status, extremity weakness, paresthesias, involuntary movement, seizure and  syncope.       Physical Exam Updated Vital Signs BP (!) 113/58 (BP Location: Right Arm)   Pulse 90   Temp 97.6 F (36.4 C) (Oral)   Resp 18   Ht 5\' 2"  (1.575 m)   Wt 68 kg (150 lb)   SpO2 98%   BMI 27.44 kg/m   16:20 Orthostatic Vital Signs MW  Orthostatic Lying   BP- Lying: 111/58  Pulse- Lying: 74      Orthostatic Sitting  BP- Sitting: 92/78  Pulse- Sitting: 84      Orthostatic Standing at 0 minutes  BP- Standing at 0 minutes:  109/98  Pulse- Standing at 0 minutes: 91     Physical Exam 1515: Physical examination:  Nursing notes reviewed; Vital signs and O2 SAT reviewed;  Constitutional: Well developed, Well nourished, Well hydrated, In no acute distress; Head:  Normocephalic, atraumatic; Eyes: EOMI, PERRL, No  scleral icterus; ENMT: Mouth and pharynx normal, Mucous membranes moist; Neck: Supple, Full range of motion, No lymphadenopathy; Cardiovascular: Irregular rate and rhythm, No gallop; Respiratory: Breath sounds clear & equal bilaterally, No wheezes.  Speaking full sentences with ease, Normal respiratory effort/excursion; Chest: Nontender, Movement normal; Abdomen: Soft, Nontender, Nondistended, Normal bowel sounds; Genitourinary: No CVA tenderness; Extremities: Pulses normal, No tenderness, No edema, No calf edema or asymmetry.; Neuro: AA&Ox3, Major CN grossly intact. Speech clear.  No facial droop.  +left horizontal end gaze fatigable nystagmus. Grips equal. Strength 5/5 equal bilat UE's and LE's.  DTR 2/4 equal bilat UE's and LE's.  No gross sensory deficits.  Normal cerebellar testing bilat UE's (finger-nose) and LE's (heel-shin)..; Skin: Color normal, Warm, Dry.   ED Treatments / Results  Labs (all labs ordered are listed, but only abnormal results are displayed)   EKG  EKG Interpretation  Date/Time:  Friday May 30 2017 14:09:01 EDT Ventricular Rate:  97 PR Interval:    QRS Duration: 88 QT Interval:  362 QTC Calculation: 459 R Axis:   84 Text  Interpretation:  Atrial fibrillation Low voltage QRS Cannot rule out Anteroseptal infarct , age undetermined When compared with ECG of 08/24/2016 QT has shortened Atrial fibrillation has replaced Normal sinus rhythm Confirmed by Samuel Jester [phone]) on 05/30/2017 3:20:44 PM       Radiology   Procedures Procedures (including critical care time)  Medications Ordered in ED Medications  sodium chloride 0.9 % bolus 500 mL (not administered)  meclizine (ANTIVERT) tablet 12.5 mg (not administered)     Initial Impression / Assessment and Plan / ED Course  I have reviewed the triage vital signs and the nursing notes.  Pertinent labs & imaging results that were available during my care of the patient were reviewed by me and considered in my medical decision making (see chart for details).  MDM Reviewed: previous chart, nursing note and vitals Reviewed previous: labs and ECG Interpretation: labs, ECG and MRI   Results for orders placed or performed during the hospital encounter of 05/30/17  CBC  Result Value Ref Range   WBC 5.5 4.0 - 10.5 K/uL   RBC 4.77 3.87 - 5.11 MIL/uL   Hemoglobin 15.3 (H) 12.0 - 15.0 g/dL   HCT 62.1 30.8 - 65.7 %   MCV 94.8 78.0 - 100.0 fL   MCH 32.1 26.0 - 34.0 pg   MCHC 33.8 30.0 - 36.0 g/dL   RDW 84.6 96.2 - 95.2 %   Platelets 162 150 - 400 K/uL  Differential  Result Value Ref Range   Neutrophils Relative % 66 %   Neutro Abs 3.6 1.7 - 7.7 K/uL   Lymphocytes Relative 28 %   Lymphs Abs 1.6 0.7 - 4.0 K/uL   Monocytes Relative 5 %   Monocytes Absolute 0.3 0.1 - 1.0 K/uL   Eosinophils Relative 1 %   Eosinophils Absolute 0.1 0.0 - 0.7 K/uL   Basophils Relative 0 %   Basophils Absolute 0.0 0.0 - 0.1 K/uL  Comprehensive metabolic panel  Result Value Ref Range   Sodium 141 135 - 145 mmol/L   Potassium 3.6 3.5 - 5.1 mmol/L   Chloride 103 101 - 111 mmol/L   CO2 26 22 - 32 mmol/L   Glucose, Bld 89 65 - 99 mg/dL   BUN 16 6 - 20 mg/dL   Creatinine, Ser  8.41 0.44 - 1.00 mg/dL   Calcium 9.3 8.9 - 32.4 mg/dL   Total Protein 6.8 6.5 - 8.1  g/dL   Albumin 4.1 3.5 - 5.0 g/dL   AST 24 15 - 41 U/L   ALT 18 14 - 54 U/L   Alkaline Phosphatase 52 38 - 126 U/L   Total Bilirubin 1.4 (H) 0.3 - 1.2 mg/dL   GFR calc non Af Amer 54 (L) >60 mL/min   GFR calc Af Amer >60 >60 mL/min   Anion gap 12 5 - 15  Troponin I  Result Value Ref Range   Troponin I <0.03 <0.03 ng/mL   Ct Head Wo Contrast Result Date: 05/30/2017 CLINICAL DATA:  Dizziness since yesterday with some nausea and imbalance. EXAM: CT HEAD WITHOUT CONTRAST TECHNIQUE: Contiguous axial images were obtained from the base of the skull through the vertex without intravenous contrast. COMPARISON:  Brain MR obtained today and on 02/10/2017. FINDINGS: Brain: Diffusely enlarged ventricles and subarachnoid spaces. Patchy white matter low density in both cerebral hemispheres. No intracranial hemorrhage, mass lesion or CT evidence of acute infarction. Vascular: No hyperdense vessel or unexpected calcification. Skull: Normal. Negative for fracture or focal lesion. Sinuses/Orbits: Unremarkable. Other: None. IMPRESSION: 1. No acute abnormality. 2. Mild to moderate diffuse cerebral and cerebellar atrophy. 3. Moderate chronic small vessel white matter ischemic changes in both cerebral hemispheres. Electronically Signed   By: Beckie Salts M.D.   On: 05/30/2017 16:12   Mr Brain Wo Contrast (neuro Protocol) Result Date: 05/30/2017 CLINICAL DATA:  Episodic vertigo, periphery. Dizziness since yesterday. EXAM: MRI HEAD WITHOUT CONTRAST TECHNIQUE: Multiplanar, multiecho pulse sequences of the brain and surrounding structures were obtained without intravenous contrast. COMPARISON:  02/10/2017 FINDINGS: Brain: No acute infarction, hemorrhage, hydrocephalus, extra-axial collection or mass lesion. No new or focal finding in the brainstem, CP angle cisterns, or temporal bones to explain the history. There is chronic small vessel  ischemia seen in the cerebral white matter and pons, mild to mA for age. Mild for age cerebral volume loss that is nonspecific. Vascular: Major flow voids are preserved, including in the vertebrobasilar circulation. Skull and upper cervical spine: Negative for marrow lesion. Sinuses/Orbits: Chronic minimal opacification of right mastoid air cells. Negative nasopharynx. Bilateral cataract resection. IMPRESSION: 1. No acute finding or change from 02/10/2017. 2. Mild to moderate chronic microvascular ischemia. Electronically Signed   By: Marnee Spring M.D.   On: 05/30/2017 16:07    1640:  Pt denies orthostasis during VS and refuses IVF. Pt has tol PO well while in the ED without N/V. Pt has ambulated with steady gait, NAD. Pt states she feels better after meds and wants to go home now. Continue to tx symptomatically at this time; will not change dose of antivert at this time with pt's meds list having multiple sedating meds.  Dx and testing d/w pt and family.  Questions answered.  Verb understanding, agreeable to d/c home with outpt f/u.   Final Clinical Impressions(s) / ED Diagnoses   Final diagnoses:  None    New Prescriptions New Prescriptions   No medications on file     Samuel Jester, DO 06/04/17 1509

## 2017-05-30 NOTE — Telephone Encounter (Signed)
Patient concerned she may have allergic reaction to flu vaccine.  She reports BP of 89/56 and feeling a bit of dizziness.   She has no other symptoms at this time. She is also alone and unable to drive while waiting for her husband to return.  She has taken meclizine for dizziness and three baby aspirin to help with any heart issues,  (has had heart problems).   Advised patient to call 911 if she has no improvement and to call back here if we can help with any thing.

## 2017-05-30 NOTE — ED Triage Notes (Signed)
Pt c/o dizziness since yesterday with some nausea and feeling off balance. Pt reports she takes meclizine for vertigo but it has not helped and this feels worse. Pt also reports ha.

## 2017-05-30 NOTE — ED Notes (Signed)
Pt ambulated to bathroom in the hall with no difficulty

## 2017-05-30 NOTE — Discharge Instructions (Signed)
Take your prescriptions as previously directed.  Increase your fluid intake (ie:  Gatoraide) for the next few days, as discussed. Call your regular medical doctor on Monday to schedule a follow up appointment next week.  Return to the Emergency Department immediately sooner if worsening.

## 2017-06-02 ENCOUNTER — Telehealth: Payer: Self-pay | Admitting: Family Medicine

## 2017-06-02 NOTE — Telephone Encounter (Signed)
appt scheduled for ER follow up

## 2017-06-02 NOTE — Telephone Encounter (Signed)
I already read the E.D. Report. Came in this AM

## 2017-06-03 ENCOUNTER — Ambulatory Visit (INDEPENDENT_AMBULATORY_CARE_PROVIDER_SITE_OTHER): Payer: Medicare Other | Admitting: Family Medicine

## 2017-06-03 ENCOUNTER — Encounter: Payer: Self-pay | Admitting: Family Medicine

## 2017-06-03 VITALS — BP 106/61 | HR 66 | Temp 97.0°F | Ht 62.0 in | Wt 151.0 lb

## 2017-06-03 DIAGNOSIS — R42 Dizziness and giddiness: Secondary | ICD-10-CM

## 2017-06-03 DIAGNOSIS — R0602 Shortness of breath: Secondary | ICD-10-CM | POA: Diagnosis not present

## 2017-06-03 DIAGNOSIS — G43919 Migraine, unspecified, intractable, without status migrainosus: Secondary | ICD-10-CM | POA: Diagnosis not present

## 2017-06-03 DIAGNOSIS — F418 Other specified anxiety disorders: Secondary | ICD-10-CM

## 2017-06-03 MED ORDER — CLONAZEPAM 0.5 MG PO TABS: 0.5000 mg | ORAL_TABLET | Freq: Two times a day (BID) | ORAL | 0 refills | Status: DC

## 2017-06-03 NOTE — Progress Notes (Signed)
Subjective:  Patient ID: Kelly [name], female    DOB: 04/18/1941  Age: 76 y.o. MRN: [id number]  CC: Hospitalization Follow-up (pt here today following up after going to the ED at [facility] for dizziness and headache)   HPI Kelly [name] presents for Follow-up from the emergency department. She was there 4 days ago Friday, October 12 for dizziness that she described as her vertigo. She says she is having a daily headache she awakens with it in the morning and feels like her head is spinning. Her husband says she just obviously doesn't feel good from his observation. She says that meclizine is not helping hydroxyzine is not helping and she is still itching but not taking the cetirizine because it didn't help. She hasn't taken butalbital in a long time. Of note is that she puts ice packs on her head in order to get relief. She describes the pain as a pressure like someone's pushing it Down over her head similar to last visit. That note was reviewed. She does get some relief with extra strength Tylenol and ice packs. She also rubs a cortisone cream on her forehead and that seems to help with itching somewhat. It does eventually go away. It may come back later in the day but even if it doesn't he will definitely be there the following morning. The pain is described as 5/10 ranging as high as 7/10. She did not take the topiramate when prescribed in the past. She is taking some clonazepam with partial relief. The dizziness is definitely described as a spinning sensation.  Depression screen [facility] 2/9 06/03/2017 05/12/2017 02/12/2017  Decreased Interest 0 0 0  Down, Depressed, Hopeless 0 0 0  PHQ - 2 Score 0 0 0  Altered sleeping - - -  Tired, decreased energy - - -  Change in appetite - - -  Feeling bad or failure about yourself  - - -  Trouble concentrating - - -  Moving slowly or fidgety/restless - - -  Suicidal thoughts - - -  PHQ-9 Score - - -    History Kelly [name] has a past medical history of  Depression with anxiety; Dysrhythmia; HLD (hyperlipidemia); HTN (hypertension); Migraine; Mild cognitive impairment (04/2017); NICM (nonischemic cardiomyopathy) ([place]); Pulmonary HTN ([place]); and Vertigo.   She has a past surgical history that includes Abdominal hysterectomy; Cholecystectomy; Appendectomy; ir generic historical (05/13/2016); ir generic historical (05/13/2016); Eye surgery (Bilateral); and Cardioversion (N/A, 07/05/2016).   Her family history includes Asthma in her mother; COPD in her mother; Cancer in her sister; Heart attack in her brother; Heart disease in her brother and mother; Thrombosis in her father.She reports that she has never smoked. She has never used smokeless tobacco. She reports that she does not drink alcohol or use drugs.    ROS Review of Systems  Constitutional: Positive for activity change. Negative for appetite change and fever.  HENT: Negative for congestion, rhinorrhea and sore throat.   Eyes: Negative for visual disturbance.  Respiratory: Positive for shortness of breath (particularly when she lays down at night). Negative for cough.   Cardiovascular: Negative for chest pain and palpitations.  Gastrointestinal: Negative for abdominal pain, diarrhea and nausea.  Genitourinary: Negative for dysuria.  Musculoskeletal: Negative for arthralgias and myalgias.  Neurological: Positive for dizziness and headaches.    Objective:  BP 106/61   Pulse 66   Temp (!) 97 F (36.1 C) (Oral)   Ht 5' 2"  (1.575 m)   Wt 151 lb (68.5 kg)  BMI 27.62 kg/m   BP Readings from Last 3 Encounters:  06/03/17 106/61  05/30/17 (!) 113/58  05/12/17 125/74    Wt Readings from Last 3 Encounters:  06/03/17 151 lb (68.5 kg)  05/30/17 150 lb (68 kg)  05/12/17 150 lb (68 kg)     Physical Exam  Constitutional: She is oriented to person, place, and time. She appears well-developed and well-nourished. No distress.  HENT:  Head: Normocephalic and atraumatic.  Right Ear:  External ear normal.  Left Ear: External ear normal.  Nose: Nose normal.  Mouth/Throat: Oropharynx is clear and moist.  Eyes: Pupils are equal, round, and reactive to light. Conjunctivae and EOM are normal.  Neck: Normal range of motion. Neck supple. No thyromegaly present.  Cardiovascular: Normal rate, regular rhythm and normal heart sounds.   No murmur heard. Pulmonary/Chest: Effort normal and breath sounds normal. No respiratory distress. She has no wheezes. She has no rales.  Abdominal: Soft. Bowel sounds are normal. She exhibits no distension. There is no tenderness.  Lymphadenopathy:    She has no cervical adenopathy.  Neurological: She is alert and oriented to person, place, and time. She has normal reflexes.  Skin: Skin is warm and dry.  Psychiatric: She has a normal mood and affect. Her behavior is normal. Judgment and thought content normal.      Assessment & Plan:   Kelly [name] was seen today for hospitalization follow-up.  Diagnoses and all orders for this visit:  Shortness of breath -     Brain natriuretic peptide -     CMP14+EGFR  Depression with anxiety  Intractable migraine without status migrainosus, unspecified migraine type  Vertigo  Other orders -     clonazePAM (KLONOPIN) 0.5 MG tablet; Take 1 tablet (0.5 mg total) by mouth 2 (two) times daily.       I have changed Kelly [name]'s clonazePAM. I am also having her maintain her butalbital-acetaminophen-caffeine, hydrOXYzine, cetirizine, meclizine, furosemide, metoprolol succinate, apixaban, and topiramate.  Allergies as of 06/03/2017   No Known Allergies     Medication List       Accurate as of 06/03/17  2:35 PM. Always use your most recent med list.          apixaban 5 MG Tabs tablet Commonly known as:  ELIQUIS Take 1 tablet (5 mg total) by mouth 2 (two) times daily.   butalbital-acetaminophen-caffeine 50-325-40 MG tablet Commonly known as:  FIORICET, ESGIC Take 1 tablet by mouth every 6  (six) hours as needed for headache or migraine.   cetirizine 10 MG tablet Commonly known as:  ZYRTEC Take 1 tablet (10 mg total) by mouth daily. For allergy symptoms   clonazePAM 0.5 MG tablet Commonly known as:  KLONOPIN Take 1 tablet (0.5 mg total) by mouth 2 (two) times daily.   furosemide 40 MG tablet Commonly known as:  LASIX TAKE ONE TABLET BY MOUTH ONCE DAILY   hydrOXYzine 25 MG tablet Commonly known as:  ATARAX/VISTARIL Take 1 tablet (25 mg total) by mouth every 6 (six) hours as needed for itching.   meclizine 12.5 MG tablet Commonly known as:  ANTIVERT TAKE 1 TABLET BY MOUTH EVERY 12-24 HOURS AS NEEDED   metoprolol succinate 25 MG 24 hr tablet Commonly known as:  TOPROL-XL Take 1 tablet (25 mg total) by mouth daily.   topiramate 25 MG tablet Commonly known as:  TOPAMAX Take 1 tablet (25 mg total) by mouth daily. Take 1 daily for 1 week then 2 daily for 1  week then 3 daily for 1 week then 4 daily. Take them together at bedtime        Follow-up: Return in about 7 days (around 06/10/2017) for headache, vertigo.  Claretta Fraise, M.D.

## 2017-06-04 ENCOUNTER — Telehealth: Payer: Self-pay | Admitting: Family Medicine

## 2017-06-04 ENCOUNTER — Other Ambulatory Visit: Payer: Self-pay | Admitting: Family Medicine

## 2017-06-04 DIAGNOSIS — G43809 Other migraine, not intractable, without status migrainosus: Secondary | ICD-10-CM

## 2017-06-04 LAB — CMP14+EGFR
ALBUMIN: 4.5 g/dL (ref 3.5–4.8)
ALT: 27 IU/L (ref 0–32)
AST: 32 IU/L (ref 0–40)
Albumin/Globulin Ratio: 2 (ref 1.2–2.2)
Alkaline Phosphatase: 64 IU/L (ref 39–117)
BUN/Creatinine Ratio: 15 (ref 12–28)
BUN: 18 mg/dL (ref 8–27)
Bilirubin Total: 1.1 mg/dL (ref 0.0–1.2)
CALCIUM: 10.5 mg/dL — AB (ref 8.7–10.3)
CHLORIDE: 99 mmol/L (ref 96–106)
CO2: 25 mmol/L (ref 20–29)
Creatinine, Ser: 1.19 mg/dL — ABNORMAL HIGH (ref 0.57–1.00)
GFR calc non Af Amer: 44 mL/min/{1.73_m2} — ABNORMAL LOW (ref >59)
GFR, EST AFRICAN AMERICAN: 51 mL/min/{1.73_m2} — AB (ref >59)
GLOBULIN, TOTAL: 2.3 g/dL (ref 1.5–4.5)
Glucose: 92 mg/dL (ref 65–99)
Potassium: 4.3 mmol/L (ref 3.5–5.2)
Sodium: 143 mmol/L (ref 134–144)
Total Protein: 6.8 g/dL (ref 6.0–8.5)

## 2017-06-04 LAB — BRAIN NATRIURETIC PEPTIDE: BNP: 241.7 pg/mL — ABNORMAL HIGH (ref 0.0–100.0)

## 2017-06-04 MED ORDER — BUTALBITAL-APAP-CAFFEINE 50-325-40 MG PO TABS: 1.0000 | ORAL_TABLET | Freq: Four times a day (QID) | ORAL | 0 refills | Status: DC | PRN

## 2017-06-04 NOTE — Telephone Encounter (Signed)
Please review and advise.

## 2017-06-04 NOTE — Telephone Encounter (Signed)
RX called into [facility]  Pt notified

## 2017-06-04 NOTE — Telephone Encounter (Signed)
I entered an order for a refill. However, since it is controlled it will need to be called in. Thanks, WS

## 2017-06-06 ENCOUNTER — Ambulatory Visit (INDEPENDENT_AMBULATORY_CARE_PROVIDER_SITE_OTHER): Payer: Medicare Other | Admitting: Family Medicine

## 2017-06-06 ENCOUNTER — Telehealth: Payer: Self-pay | Admitting: Cardiology

## 2017-06-06 ENCOUNTER — Encounter: Payer: Self-pay | Admitting: Family Medicine

## 2017-06-06 DIAGNOSIS — H8143 Vertigo of central origin, bilateral: Secondary | ICD-10-CM | POA: Diagnosis not present

## 2017-06-06 DIAGNOSIS — IMO0001 Reserved for inherently not codable concepts without codable children: Secondary | ICD-10-CM

## 2017-06-06 MED ORDER — CLONAZEPAM 0.5 MG PO TABS: 0.5000 mg | ORAL_TABLET | Freq: Three times a day (TID) | ORAL | 0 refills | Status: DC | PRN

## 2017-06-06 NOTE — Telephone Encounter (Signed)
Pt is not getting any better,she have absolutely no energy and her balance is off. He wonder if pt needs to be seen or needs to go to the hospital.

## 2017-06-06 NOTE — Progress Notes (Signed)
Subjective:  Patient ID: Kelly [name], female    DOB: 1940-10-29  Age: 76 y.o. MRN: [id number]  CC: Dizziness (pt here today c/o being dizzy or "feeling drunk")   HPI Kelly [name] presents for Continued sense of feeling drunk when she tries to walk. The room isn't spinning as much but she does feel off balance and there is some sense of motion remaining. She does not have any nausea. There has been no focal neurologic change specifically no weakness of an extremity or of her face. No visual changes. There is some tinnitus. Cannot tell which year probably both. Yesterday she had her worst headache ever. She does get partially relieved by using the Bupap. However, her husband is concerned that each her meds can cause dizziness. Of note is that her blood work done 3 days ago showed that she has minimal if any suggestion of congestive heart failure. And her exam that day was negative. However she continues to use furosemide. She does not get any relief from the meclizine or hydroxyzine and has discontinued those. She did start the topiramate but has only taken it twice.  Depression screen [facility] 2/9 06/06/2017 06/03/2017 05/12/2017  Decreased Interest 0 0 0  Down, Depressed, Hopeless - 0 0  PHQ - 2 Score 0 0 0  Altered sleeping - - -  Tired, decreased energy - - -  Change in appetite - - -  Feeling bad or failure about yourself  - - -  Trouble concentrating - - -  Moving slowly or fidgety/restless - - -  Suicidal thoughts - - -  PHQ-9 Score - - -    History Kelly [name] has a past medical history of Depression with anxiety; Dysrhythmia; HLD (hyperlipidemia); HTN (hypertension); Migraine; Mild cognitive impairment (04/2017); NICM (nonischemic cardiomyopathy) (H[facility]); Pulmonary HTN ([facility]); and Vertigo.   She has a past surgical history that includes Abdominal hysterectomy; Cholecystectomy; Appendectomy; ir generic historical (05/13/2016); ir generic historical (05/13/2016); Eye surgery (Bilateral); and  Cardioversion (N/A, 07/05/2016).   Her family history includes Asthma in her mother; COPD in her mother; Cancer in her sister; Heart attack in her brother; Heart disease in her brother and mother; Thrombosis in her father.She reports that she has never smoked. She has never used smokeless tobacco. She reports that she does not drink alcohol or use drugs.    ROS Review of Systems  Constitutional: Negative for activity change, appetite change and fever.  HENT: Negative for congestion, rhinorrhea and sore throat.   Eyes: Negative for visual disturbance.  Respiratory: Negative for cough and shortness of breath.   Cardiovascular: Negative for chest pain and palpitations.  Gastrointestinal: Negative for abdominal pain, diarrhea and nausea.  Genitourinary: Negative for dysuria.  Musculoskeletal: Negative for arthralgias and myalgias.  Neurological: Positive for dizziness and light-headedness. Negative for tremors, seizures, syncope, speech difficulty, weakness and numbness.  Hematological: Does not bruise/bleed easily.  Psychiatric/Behavioral: Positive for agitation and dysphoric mood. The patient is nervous/anxious.     Objective:  BP 124/80   Pulse 95   Temp (!) 96.9 F (36.1 C) (Oral)   Ht 5\' 2"  (1.575 m)   Wt 152 lb (68.9 kg)   BMI 27.80 kg/m   BP Readings from Last 3 Encounters:  06/06/17 124/80  06/03/17 106/61  05/30/17 (!) 113/58    Wt Readings from Last 3 Encounters:  06/06/17 152 lb (68.9 kg)  06/03/17 151 lb (68.5 kg)  05/30/17 150 lb (68 kg)     Physical Exam  Constitutional: She is oriented to person, place, and time. She appears well-developed and well-nourished.  Patient looks tired and distressed   HENT:  Head: Normocephalic and atraumatic.  Right Ear: External ear normal.  Left Ear: External ear normal.  Nose: Nose normal.  Mouth/Throat: Oropharynx is clear and moist.  Eyes: Pupils are equal, round, and reactive to light. Conjunctivae and EOM are normal.   Neck: Normal range of motion. Neck supple. No thyromegaly present.  Cardiovascular: Normal rate, regular rhythm and normal heart sounds.   No murmur heard. Pulmonary/Chest: Effort normal and breath sounds normal. No respiratory distress. She has no wheezes. She has no rales.  Abdominal: Soft. Bowel sounds are normal. She exhibits no distension. There is no tenderness.  Musculoskeletal: Normal range of motion. She exhibits no edema.  Lymphadenopathy:    She has no cervical adenopathy.  Neurological: She is alert and oriented to person, place, and time. She has normal reflexes. No cranial nerve deficit. She exhibits normal muscle tone.  Skin: Skin is warm and dry.  Psychiatric: Her behavior is normal. Thought content normal.      Assessment & Plan:   Kelly [name] was seen today for dizziness.  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       I have discontinued Kelly [name]'s furosemide. I have also changed her clonazePAM. Additionally, I am having her maintain her hydrOXYzine, cetirizine, meclizine, metoprolol succinate, apixaban, topiramate, and butalbital-acetaminophen-caffeine.  Allergies as of 06/06/2017   No Known Allergies     Medication List       Accurate as of 06/06/17  5:17 PM. Always use your most recent med list.          apixaban 5 MG Tabs tablet Commonly known as:  ELIQUIS Take 1 tablet (5 mg total) by mouth 2 (two) times daily.   butalbital-acetaminophen-caffeine 50-325-40 MG tablet Commonly known as:  FIORICET, ESGIC Take 1 tablet by mouth every 6 (six) hours as needed for headache or migraine.   cetirizine 10 MG tablet Commonly known as:  ZYRTEC Take 1 tablet (10 mg total) by mouth daily. For allergy symptoms   clonazePAM 0.5 MG  tablet Commonly known as:  KLONOPIN Take 1 tablet (0.5 mg total) by mouth 3 (three) times daily as needed for anxiety.   hydrOXYzine 25 MG tablet Commonly known as:  ATARAX/VISTARIL Take 1 tablet (25 mg total) by mouth every 6 (six) hours as needed for itching.   meclizine 12.5 MG tablet Commonly known as:  ANTIVERT TAKE 1 TABLET BY MOUTH EVERY 12-24 HOURS AS NEEDED   metoprolol succinate 25 MG 24 hr tablet Commonly known as:  TOPROL-XL Take 1 tablet (25 mg total) by mouth daily.   topiramate 25 MG tablet Commonly known as:  TOPAMAX Take 1 tablet (25 mg total) by mouth daily. Take 1 daily for 1 week then 2 daily for 1 week then 3 daily for 1 week then 4 daily. Take them together at bedtime        Follow-up: Return in about 4 days (around 06/10/2017) for Vertigo/dizziness/headache.  Mechele ClaudeWarren Avonda Toso, M.D.

## 2017-06-06 NOTE — Telephone Encounter (Signed)
S/w Husband (DPR)  He states that "once again" pt c/o constant headache, dizziness and intermittent Nausea he states that this happened before and "he went to [facility] and they did noting and sent them home". BP yesterday 127/72 HR 97 now 114/67.   I read the ED noted on 05-30-17 it lists DX as vertigo. This is not cardiac, call PCP -or- go to ER again. Verbalizing understanding, he will call PCP and go to the ER if needed

## 2017-06-10 ENCOUNTER — Encounter: Payer: Self-pay | Admitting: Family Medicine

## 2017-06-10 ENCOUNTER — Ambulatory Visit (INDEPENDENT_AMBULATORY_CARE_PROVIDER_SITE_OTHER): Payer: Medicare Other | Admitting: Family Medicine

## 2017-06-10 ENCOUNTER — Other Ambulatory Visit: Payer: Self-pay

## 2017-06-10 VITALS — BP 109/72 | HR 60 | Temp 96.7°F | Ht 62.0 in | Wt 154.0 lb

## 2017-06-10 DIAGNOSIS — I481 Persistent atrial fibrillation: Secondary | ICD-10-CM

## 2017-06-10 DIAGNOSIS — R42 Dizziness and giddiness: Secondary | ICD-10-CM | POA: Diagnosis not present

## 2017-06-10 DIAGNOSIS — I10 Essential (primary) hypertension: Secondary | ICD-10-CM

## 2017-06-10 DIAGNOSIS — H81399 Other peripheral vertigo, unspecified ear: Secondary | ICD-10-CM

## 2017-06-10 DIAGNOSIS — I4819 Other persistent atrial fibrillation: Secondary | ICD-10-CM

## 2017-06-10 MED ORDER — CLONAZEPAM 0.5 MG PO TABS: 0.5000 mg | ORAL_TABLET | Freq: Two times a day (BID) | ORAL | 0 refills | Status: DC

## 2017-06-10 MED ORDER — DICLOFENAC SODIUM 75 MG PO TBEC: 75.0000 mg | DELAYED_RELEASE_TABLET | Freq: Two times a day (BID) | ORAL | 0 refills | Status: DC

## 2017-06-10 NOTE — Progress Notes (Signed)
Subjective:  Patient ID: Kelly [name], female    DOB: 01/17/1941  Age: 76 y.o. MRN: [id number]  CC: Follow-up (pt here today for 1 week follow up of vertigo which isn't any better and still waiting on MRi)   HPI Kelly [name] presents for Follow-up of the dizziness. She says it has gone but it is now replaced with lower back pain. She has been taking the clonazepam 3 times daily and has discontinued several drugs as noted in previous office visit. She does continue to take her Eliquis and metoprolol as well as continuing to titrate her topiramate. She has no other medications currently. She denies edema since discontinuing the furosemide. The low back pain is moderate in severity. Present constantly for the last couple of days. And is 5-6/10. It is a dull ache with occasional sharp pain. It goes away when she rests in bed. It is exacerbated by activity.  [facility] 06/10/2017 06/06/2017 06/03/2017  Decreased Interest 0 0 0  Down, Depressed, Hopeless 0 - 0  PHQ - 2 Score 0 0 0  Altered sleeping - - -  Tired, decreased energy - - -  Change in appetite - - -  Feeling bad or failure about yourself  - - -  Trouble concentrating - - -  Moving slowly or fidgety/restless - - -  Suicidal thoughts - - -  PHQ-9 Score - - -    History Kelly [name] has a past medical history of Depression with anxiety; Dysrhythmia; HLD (hyperlipidemia); HTN (hypertension); Migraine; Mild cognitive impairment (04/2017); NICM (nonischemic cardiomyopathy) ([facility]); Pulmonary HTN ([facility]); and Vertigo.   She has a past surgical history that includes Abdominal hysterectomy; Cholecystectomy; Appendectomy; ir generic historical (05/13/2016); ir generic historical (05/13/2016); Eye surgery (Bilateral); and Cardioversion (N/A, 07/05/2016).   Her family history includes Asthma in her mother; COPD in her mother; Cancer in her sister; Heart attack in her brother; Heart disease in her brother and mother; Thrombosis in her  father.She reports that she has never smoked. She has never used smokeless tobacco. She reports that she does not drink alcohol or use drugs.    ROS Review of Systems  Constitutional: Negative for activity change, appetite change and fever.  HENT: Negative for congestion, rhinorrhea and sore throat.   Eyes: Negative for visual disturbance.  Respiratory: Negative for cough and shortness of breath.   Cardiovascular: Negative for chest pain and palpitations.  Gastrointestinal: Negative for abdominal pain, diarrhea and nausea.  Genitourinary: Negative for dysuria.  Musculoskeletal: Positive for arthralgias, back pain and myalgias. Negative for joint swelling.  Neurological: Positive for dizziness.    Objective:  BP 109/72   Pulse 60   Temp (!) 96.7 F (35.9 C) (Oral)   Ht 5\' 2"  (1.575 m)   Wt 154 lb (69.9 kg)   BMI 28.17 kg/m   BP Readings from Last 3 Encounters:  06/10/17 109/72  06/06/17 124/80  06/03/17 106/61    Wt Readings from Last 3 Encounters:  06/10/17 154 lb (69.9 kg)  06/06/17 152 lb (68.9 kg)  06/03/17 151 lb (68.5 kg)     Physical Exam  Constitutional: She is oriented to person, place, and time. She appears well-developed and well-nourished. No distress.  HENT:  Head: Normocephalic and atraumatic.  Right Ear: External ear normal.  Left Ear: External ear normal.  Nose: Nose normal.  Mouth/Throat: Oropharynx is clear and moist.  Eyes: Pupils are equal, round, and reactive to light. Conjunctivae and EOM are normal.  Neck: Normal range of motion. Neck supple. No thyromegaly present.  Cardiovascular: Normal rate, regular rhythm and normal heart sounds.   No murmur heard. Pulmonary/Chest: Effort normal and breath sounds normal. No respiratory distress. She has no wheezes. She has no rales.  Abdominal: Soft. Bowel sounds are normal. She exhibits no distension. There is no tenderness.  Lymphadenopathy:    She has no cervical adenopathy.  Neurological: She is  alert and oriented to person, place, and time. She has normal reflexes.  Patient is has a drugged appearance with a blank stare but is able to answer questions appropriately.  Skin: Skin is warm and dry.  Psychiatric: Judgment and thought content normal. Her affect is blunt. Her speech is delayed. She is slowed. Cognition and memory are normal.      Assessment & Plan:   Kelly [name] was seen today for follow-up.  Diagnoses and all orders for this visit:  Persistent atrial fibrillation (HCC)  Essential hypertension  Vertigo -     Ambulatory referral to Neurology  Other orders -     clonazePAM (KLONOPIN) 0.5 MG tablet; Take 1 tablet (0.5 mg total) by mouth 2 (two) times daily. -     diclofenac (VOLTAREN) 75 MG EC tablet; Take 1 tablet (75 mg total) by mouth 2 (two) times daily.    I believe the discontinuation of furosemide and increase of the clonazepam had been effective but have unfortunately overshot. Therefore her clonazepam has been reduced to twice a day as of today. Back pain will be treated as noted below.   I have discontinued Kelly [name]'s hydrOXYzine, cetirizine, meclizine, and butalbital-acetaminophen-caffeine. I have also changed her clonazePAM. Additionally, I am having her start on diclofenac. Lastly, I am having her maintain her metoprolol succinate, apixaban, and topiramate.  Allergies as of 06/10/2017   No Known Allergies     Medication List       Accurate as of 06/10/17  5:53 PM. Always use your most recent med list.          apixaban 5 MG Tabs tablet Commonly known as:  ELIQUIS Take 1 tablet (5 mg total) by mouth 2 (two) times daily.   clonazePAM 0.5 MG tablet Commonly known as:  KLONOPIN Take 1 tablet (0.5 mg total) by mouth 2 (two) times daily.   diclofenac 75 MG EC tablet Commonly known as:  VOLTAREN Take 1 tablet (75 mg total) by mouth 2 (two) times daily.   metoprolol succinate 25 MG 24 hr tablet Commonly known as:  TOPROL-XL Take 1 tablet  (25 mg total) by mouth daily.   topiramate 25 MG tablet Commonly known as:  TOPAMAX Take 1 tablet (25 mg total) by mouth daily. Take 1 daily for 1 week then 2 daily for 1 week then 3 daily for 1 week then 4 daily. Take them together at bedtime        Follow-up: Return in about 7 days (around 06/17/2017).  Mechele Claude, M.D.

## 2017-06-11 ENCOUNTER — Other Ambulatory Visit: Payer: Self-pay | Admitting: *Deleted

## 2017-06-11 ENCOUNTER — Encounter: Payer: Self-pay | Admitting: Neurology

## 2017-06-11 MED ORDER — TOPIRAMATE 25 MG PO TABS: 25.0000 mg | ORAL_TABLET | Freq: Every day | ORAL | 0 refills | Status: DC

## 2017-06-14 DIAGNOSIS — H10013 Acute follicular conjunctivitis, bilateral: Secondary | ICD-10-CM | POA: Diagnosis not present

## 2017-06-17 ENCOUNTER — Ambulatory Visit: Payer: Medicare Other | Admitting: Family Medicine

## 2017-06-17 ENCOUNTER — Other Ambulatory Visit: Payer: Self-pay | Admitting: *Deleted

## 2017-06-17 DIAGNOSIS — H10013 Acute follicular conjunctivitis, bilateral: Secondary | ICD-10-CM | POA: Diagnosis not present

## 2017-06-18 ENCOUNTER — Encounter: Payer: Self-pay | Admitting: Family Medicine

## 2017-06-18 ENCOUNTER — Ambulatory Visit (INDEPENDENT_AMBULATORY_CARE_PROVIDER_SITE_OTHER): Payer: Medicare Other | Admitting: Family Medicine

## 2017-06-18 VITALS — BP 108/73 | HR 80 | Ht 62.0 in | Wt 158.0 lb

## 2017-06-18 DIAGNOSIS — I272 Pulmonary hypertension, unspecified: Secondary | ICD-10-CM

## 2017-06-18 DIAGNOSIS — I481 Persistent atrial fibrillation: Secondary | ICD-10-CM | POA: Diagnosis not present

## 2017-06-18 DIAGNOSIS — R42 Dizziness and giddiness: Secondary | ICD-10-CM | POA: Diagnosis not present

## 2017-06-18 DIAGNOSIS — I4819 Other persistent atrial fibrillation: Secondary | ICD-10-CM

## 2017-06-18 DIAGNOSIS — R0602 Shortness of breath: Secondary | ICD-10-CM

## 2017-06-18 MED ORDER — CLONAZEPAM 0.5 MG PO TABS: ORAL_TABLET | ORAL | 0 refills | Status: DC

## 2017-06-18 NOTE — Progress Notes (Signed)
Subjective:  Patient ID: Kelly [name], female    DOB: 01/08/1941  Age: 76 y.o. MRN: [id number]  CC: Follow-up (pt here today for follow up and her Neuro appt isn't until Feb but she has her MRI tomorrow [facility])   HPI Kelly [name] presents for Continued problems with dizziness. At this point she is not having vertigo overtly. However she does feel off balance when she walks. She does not have any symptoms turn her head or moving while seated or laying down. She feels a bit woozy at times. She is not having headaches currently. She has not yet started the topiramate. Her husband tells me the prescription just got filled yesterday and he's to take it up at the pharmacy today. The back pain has resolved completely. She does have some ongoing dyspnea on exertion consult consistent with her history of pulmonary hypertension. However this is at or near baseline.  Patient in for follow-up of atrial fibrillation. Patient denies any recent bouts of chest pain or palpitations. Additionally, patient is taking anticoagulants. Patient denies any recent excessive bleeding episodes including epistaxis, bleeding from the gums, genitalia, rectal bleeding or hematuria. Additionally there has been no excessive bruising.  [facility] 2[facility] 06/18/2017 06/10/2017 06/06/2017  Decreased Interest 0 0 0  Down, Depressed, Hopeless 0 0 -  PHQ - 2 Score 0 0 0  Altered sleeping - - -  Tired, decreased energy - - -  Change in appetite - - -  Feeling bad or failure about yourself  - - -  Trouble concentrating - - -  Moving slowly or fidgety/restless - - -  Suicidal thoughts - - -  PHQ-9 Score - - -    History Kelly RoundsSally has a past medical history of Depression with anxiety; Dysrhythmia; HLD (hyperlipidemia); HTN (hypertension); Migraine; Mild cognitive impairment (04/2017); NICM (nonischemic cardiomyopathy) (HCC); Pulmonary HTN (HCC); and Vertigo.   She has a past surgical history that includes  Abdominal hysterectomy; Cholecystectomy; Appendectomy; ir generic historical (05/13/2016); ir generic historical (05/13/2016); Eye surgery (Bilateral); and Cardioversion (N/A, 07/05/2016).   Her family history includes Asthma in her mother; COPD in her mother; Cancer in her sister; Heart attack in her brother; Heart disease in her brother and mother; Thrombosis in her father.She reports that she has never smoked. She has never used smokeless tobacco. She reports that she does not drink alcohol or use drugs.    ROS Review of Systems  Constitutional: Negative for activity change, appetite change and fever.  HENT: Negative for congestion, rhinorrhea and sore throat.   Eyes: Negative for visual disturbance.  Respiratory: Negative for cough and shortness of breath.   Cardiovascular: Negative for chest pain and palpitations.  Gastrointestinal: Negative for abdominal pain, diarrhea and nausea.  Genitourinary: Negative for dysuria.  Musculoskeletal: Negative for arthralgias and myalgias.    Objective:  BP 108/73   Pulse 80   Ht 5\' 2"  (1.575 m)   Wt 158 lb (71.7 kg)   BMI 28.90 kg/m   BP Readings from Last 3 Encounters:  06/18/17 108/73  06/10/17 109/72  06/06/17 124/80    Wt Readings from Last 3 Encounters:  06/18/17 158 lb (71.7 kg)  06/10/17 154 lb (69.9 kg)  06/06/17 152 lb (68.9 kg)     Physical Exam  Constitutional: She is oriented to person, place, and time. She appears well-developed and well-nourished. No distress.  HENT:  Head: Normocephalic and atraumatic.  Eyes: Pupils are equal, round, and reactive to light.  Conjunctivae are normal.  Neck: Normal range of motion. Neck supple. No thyromegaly present.  Cardiovascular: Normal rate, regular rhythm and normal heart sounds.   No murmur heard. Pulmonary/Chest: Effort normal and breath sounds normal. No respiratory distress. She has no wheezes. She has no rales.  Musculoskeletal: Normal range of motion.  Gait is slow. It is  deliberate. However there is no instability.  Lymphadenopathy:    She has no cervical adenopathy.  Neurological: She is alert and oriented to person, place, and time.  Skin: Skin is warm and dry.  Psychiatric: Her behavior is normal. Judgment and thought content normal. Her affect is blunt. Her speech is delayed.      Assessment & Plan:   Kelly [name] was seen today for follow-up.  Diagnoses and all orders for this visit:  Dizziness  Shortness of breath  Pulmonary HTN (HCC)  Persistent atrial fibrillation (HCC)  Other orders -     clonazePAM (KLONOPIN) 0.5 MG tablet; Take 1/2 tablet QAM and 1 tablet QHS.    Back pain has resolved. Diclofenac discontinued. Patient denies any abnormal bleeding.   I have discontinued Kelly [name] diclofenac. I have also changed her clonazePAM. Additionally, I am having her maintain her metoprolol succinate, apixaban, topiramate, erythromycin, furosemide, and trifluridine.  Allergies as of 06/18/2017   No Known Allergies     Medication List       Accurate as of 06/18/17 11:58 AM. Always use your most recent med list.          apixaban 5 MG Tabs tablet Commonly known as:  ELIQUIS Take 1 tablet (5 mg total) by mouth 2 (two) times daily.   clonazePAM 0.5 MG tablet Commonly known as:  KLONOPIN Take 1/2 tablet QAM and 1 tablet QHS.   erythromycin ophthalmic ointment   furosemide 40 MG tablet Commonly known as:  LASIX   metoprolol succinate 25 MG 24 hr tablet Commonly known as:  TOPROL-XL Take 1 tablet (25 mg total) by mouth daily.   topiramate 25 MG tablet Commonly known as:  TOPAMAX Take 1 tablet (25 mg total) by mouth daily. Take 1 daily for 1 week then 2 daily for 1 week then 3 daily for 1 week then 4 daily. Take them together at bedtime   trifluridine 1 % ophthalmic solution Commonly known as:  VIROPTIC        Follow-up: Return in about 2 weeks (around 07/02/2017).  Mechele Claude, M.D.

## 2017-06-19 ENCOUNTER — Ambulatory Visit (HOSPITAL_COMMUNITY)
Admission: RE | Admit: 2017-06-19 | Discharge: 2017-06-19 | Disposition: A | Discharge status: Home / Self Care | Payer: Medicare Other | Source: Ambulatory Visit | Attending: Family Medicine | Admitting: Family Medicine

## 2017-06-19 DIAGNOSIS — G319 Degenerative disease of nervous system, unspecified: Secondary | ICD-10-CM | POA: Diagnosis not present

## 2017-06-19 DIAGNOSIS — H81399 Other peripheral vertigo, unspecified ear: Secondary | ICD-10-CM | POA: Diagnosis present

## 2017-06-19 DIAGNOSIS — I6782 Cerebral ischemia: Secondary | ICD-10-CM | POA: Insufficient documentation

## 2017-06-19 DIAGNOSIS — R42 Dizziness and giddiness: Secondary | ICD-10-CM | POA: Diagnosis not present

## 2017-06-20 ENCOUNTER — Telehealth: Payer: Self-pay | Admitting: Family Medicine

## 2017-06-21 NOTE — Telephone Encounter (Signed)
Pt was notified of results yesterday by Ernest Mallick.

## 2017-06-23 ENCOUNTER — Telehealth: Payer: Self-pay | Admitting: Family Medicine

## 2017-06-23 NOTE — Telephone Encounter (Signed)
Patient aware of results.

## 2017-07-01 ENCOUNTER — Ambulatory Visit (INDEPENDENT_AMBULATORY_CARE_PROVIDER_SITE_OTHER): Payer: Medicare Other | Admitting: Family Medicine

## 2017-07-01 ENCOUNTER — Encounter: Payer: Self-pay | Admitting: Family Medicine

## 2017-07-01 VITALS — BP 97/70 | HR 100 | Temp 94.1°F | Wt 153.2 lb

## 2017-07-01 DIAGNOSIS — R42 Dizziness and giddiness: Secondary | ICD-10-CM

## 2017-07-01 MED ORDER — TOPIRAMATE 100 MG PO TABS: 100.0000 mg | ORAL_TABLET | Freq: Every day | ORAL | 0 refills | Status: DC

## 2017-07-01 MED ORDER — CLONAZEPAM 0.5 MG PO TABS: ORAL_TABLET | ORAL | 0 refills | Status: DC

## 2017-07-01 NOTE — Progress Notes (Signed)
Subjective:  Patient ID: Kelly MannersSally F Mikulski, female    DOB: 07/02/1941  Age: 76 y.o. MRN: [id number]  CC: Dizziness (2 wk follow-up)   HPI Kelly MannersSally F Terhune presents for follow-up of her dizziness.  She no longer has the dizziness or headache that she is groggy and staggery because she is so drugged.  She has continued to feel this way in spite of cutting back on the clonazepam.  Of note is that her symptoms resolved immediately with the start of the clonazepam but were replaced by the drug feeling that her husband is very concerned about today.  He says that she staggers so much she is afraid she is going to fall.  She denies dizziness and headache but admits that she could sleep all the time.  They also have over the last 2 weeks titrated her topiramate up to 100 mg even though they were supposed to go in 1 week increments.  That means that they should only be at 50 mg and starting the 75 mg soon.  [facility] 06/18/2017 06/10/2017 06/06/2017  Decreased Interest 0 0 0  Down, Depressed, Hopeless 0 0 -  PHQ - 2 Score 0 0 0  Altered sleeping - - -  Tired, decreased energy - - -  Change in appetite - - -  Feeling bad or failure about yourself  - - -  Trouble concentrating - - -  Moving slowly or fidgety/restless - - -  Suicidal thoughts - - -  PHQ-9 Score - - -    History [name] has a past medical history of Depression with anxiety, Dysrhythmia, HLD (hyperlipidemia), HTN (hypertension), Migraine, Mild cognitive impairment (04/2017), NICM (nonischemic cardiomyopathy) (HCC), Pulmonary HTN (HCC), and Vertigo.   She has a past surgical history that includes Abdominal hysterectomy; Cholecystectomy; Appendectomy; ir generic historical (05/13/2016); ir generic historical (05/13/2016); Eye surgery (Bilateral); and CARDIOVERSION (N/A, 07/05/2016).   Her family history includes Asthma in her mother; COPD in her mother; Cancer in her sister; Heart attack in her brother; Heart disease in her  brother and mother; Thrombosis in her father.She reports that  has never smoked. she has never used smokeless tobacco. She reports that she does not drink alcohol or use drugs.    ROS Review of Systems  Constitutional: Positive for activity change and fatigue (Groggy and tired a lot). Negative for appetite change and fever.  HENT: Negative for congestion, rhinorrhea and sore throat.   Eyes: Negative for visual disturbance.  Respiratory: Negative for cough and shortness of breath.   Cardiovascular: Negative for chest pain and palpitations.  Gastrointestinal: Negative for abdominal pain, diarrhea and nausea.  Genitourinary: Negative for dysuria.  Musculoskeletal: Negative for arthralgias and myalgias.    Objective:  BP 97/70   Pulse 100   Temp (!) 94.1 F (34.5 C) (Oral)   Wt 153 lb 3.2 oz (69.5 kg)   BMI 28.02 kg/m   BP Readings from Last 3 Encounters:  07/01/17 97/70  06/18/17 108/73  06/10/17 109/72    Wt Readings from Last 3 Encounters:  07/01/17 153 lb 3.2 oz (69.5 kg)  06/18/17 158 lb (71.7 kg)  06/10/17 154 lb (69.9 kg)     Physical Exam  Constitutional: She is oriented to person, place, and time. She appears well-developed and well-nourished. No distress.  Eyes: EOM are normal. Pupils are equal, round, and reactive to light. Right eye exhibits no discharge. Left eye exhibits no discharge. No scleral icterus.  Neck: Normal range of  motion. Neck supple.  Cardiovascular: Normal rate and regular rhythm.  Pulmonary/Chest: Breath sounds normal.  Musculoskeletal: Normal range of motion.  Neurological: She is alert and oriented to person, place, and time.  Skin: Skin is warm and dry.  Psychiatric: Thought content normal. Her affect is blunt. Her speech is delayed. She is slowed. Cognition and memory are normal.  Patient has a bit of a blank stare and I do not think that this is as deep as it was before but she still has a drugged look.      Assessment & Plan:    Claudio was seen today for dizziness.  Diagnoses and all orders for this visit:  Dizziness -     Ambulatory referral to Neurology  Other orders -     Discontinue: clonazePAM (KLONOPIN) 0.5 MG tablet; Take 1/2 tablet QAM and 1 tablet QHS. -     topiramate (TOPAMAX) 100 MG tablet; Take 1 tablet (100 mg total) at bedtime by mouth. -     clonazePAM (KLONOPIN) 0.5 MG tablet; Take 1/2 tablet twice a day for 1 week and 1/2 tablet QHS.       I have discontinued Sage Kinlaw. Totton's clonazePAM. I have also changed her clonazePAM. Additionally, I am having her start on topiramate. Lastly, I am having her maintain her metoprolol succinate, apixaban, topiramate, erythromycin, furosemide, and trifluridine.  Allergies as of 07/01/2017   No Known Allergies     Medication List        Accurate as of 07/01/17  2:20 PM. Always use your most recent med list.          apixaban 5 MG Tabs tablet Commonly known as:  ELIQUIS Take 1 tablet (5 mg total) by mouth 2 (two) times daily.   clonazePAM 0.5 MG tablet Commonly known as:  KLONOPIN Take 1/2 tablet twice a day for 1 week and 1/2 tablet QHS.   erythromycin ophthalmic ointment   furosemide 40 MG tablet Commonly known as:  LASIX   metoprolol succinate 25 MG 24 hr tablet Commonly known as:  TOPROL-XL Take 1 tablet (25 mg total) by mouth daily.   topiramate 25 MG tablet Commonly known as:  TOPAMAX Take 1 tablet (25 mg total) by mouth daily. Take 1 daily for 1 week then 2 daily for 1 week then 3 daily for 1 week then 4 daily. Take them together at bedtime   topiramate 100 MG tablet Commonly known as:  TOPAMAX Take 1 tablet (100 mg total) at bedtime by mouth.   trifluridine 1 % ophthalmic solution Commonly known as:  VIROPTIC      Her neurology appointment is still 3 months away.  I think I can get her in with Dr. Isabell Jarvis at [facility] neurology much quicker than this.  I am going to try to redirect her referral today.  I have no problem  with her seeing the doctors at the [facility] except that I would really like to see her seen sooner.  She is going to taper down to 1/2 tablet at bedtime only of the clonazepam over the next several days.  Leave the topiramate as is.  Follow-up in 2 weeks  Follow-up: Return in about 2 weeks (around 07/15/2017).  Mechele Claude, M.D.

## 2017-07-07 ENCOUNTER — Telehealth: Payer: Self-pay | Admitting: Family Medicine

## 2017-07-15 NOTE — Telephone Encounter (Signed)
Letter sent with appointment date/time 

## 2017-07-16 ENCOUNTER — Ambulatory Visit (INDEPENDENT_AMBULATORY_CARE_PROVIDER_SITE_OTHER): Payer: Medicare Other | Admitting: Family Medicine

## 2017-07-16 ENCOUNTER — Encounter: Payer: Self-pay | Admitting: Family Medicine

## 2017-07-16 VITALS — BP 106/64 | HR 94 | Ht 62.0 in | Wt 154.0 lb

## 2017-07-16 DIAGNOSIS — G43919 Migraine, unspecified, intractable, without status migrainosus: Secondary | ICD-10-CM

## 2017-07-16 DIAGNOSIS — R42 Dizziness and giddiness: Secondary | ICD-10-CM | POA: Diagnosis not present

## 2017-07-16 NOTE — Progress Notes (Signed)
Subjective:  Patient ID: Kelly [name], female    DOB: 04/19/1941  Age: 76 y.o. MRN: [id number]  CC: Follow-up (pt here today for 2 week recheck after starting Topamax and increasing to 100mg  tablet QHS.)   HPI Kelly [name] presents for patient states today that her headaches have remitted and she has become more alert and functional.  However she is tired a lot still.  She is tolerating her Topamax at bedtime.  She is not off balance as much although she does have some spells according to her husband.  She is not able to return to her usual activities as yet.  She is alert and able to converse and does not feel drugged like she has in her most recent visits.  Depression screen [facility] 2/9 07/16/2017 06/18/2017 06/10/2017  Decreased Interest 0 0 0  Down, Depressed, Hopeless 0 0 0  PHQ - 2 Score 0 0 0  Altered sleeping - - -  Tired, decreased energy - - -  Change in appetite - - -  Feeling bad or failure about yourself  - - -  Trouble concentrating - - -  Moving slowly or fidgety/restless - - -  Suicidal thoughts - - -  PHQ-9 Score - - -    History Kelly [name] has a past medical history of Depression with anxiety, Dysrhythmia, HLD (hyperlipidemia), HTN (hypertension), Migraine, Mild cognitive impairment (04/2017), NICM (nonischemic cardiomyopathy) (HCC), Pulmonary HTN (HCC), and Vertigo.   She has a past surgical history that includes Abdominal hysterectomy; Cholecystectomy; Appendectomy; ir generic historical (05/13/2016); ir generic historical (05/13/2016); Eye surgery (Bilateral); and Cardioversion (N/A, 07/05/2016).   Her family history includes Asthma in her mother; COPD in her mother; Cancer in her sister; Heart attack in her brother; Heart disease in her brother and mother; Thrombosis in her father.She reports that  has never smoked. she has never used smokeless tobacco. She reports that she does not drink alcohol or use drugs.    ROS Review of Systems  Constitutional: Negative  for activity change, appetite change and fever.  HENT: Negative for congestion, rhinorrhea and sore throat.   Eyes: Negative for visual disturbance.  Respiratory: Negative for cough and shortness of breath.   Cardiovascular: Negative for chest pain and palpitations.  Gastrointestinal: Negative for abdominal pain, diarrhea and nausea.  Genitourinary: Negative for dysuria.  Musculoskeletal: Negative for arthralgias and myalgias.    Objective:  BP 106/64   Pulse 94   Ht 5\' 2"  (1.575 m)   Wt 154 lb (69.9 kg)   BMI 28.17 kg/m   BP Readings from Last 3 Encounters:  07/16/17 106/64  07/01/17 97/70  06/18/17 108/73    Wt Readings from Last 3 Encounters:  07/16/17 154 lb (69.9 kg)  07/01/17 153 lb 3.2 oz (69.5 kg)  06/18/17 158 lb (71.7 kg)     Physical Exam  Constitutional: She is oriented to person, place, and time. She appears well-developed and well-nourished. No distress.  HENT:  Head: Normocephalic and atraumatic.  Eyes: Conjunctivae are normal. Pupils are equal, round, and reactive to light.  Neck: Normal range of motion. Neck supple. No thyromegaly present.  Cardiovascular: Normal rate, regular rhythm and normal heart sounds.  No murmur heard. Pulmonary/Chest: Effort normal and breath sounds normal. No respiratory distress. She has no wheezes. She has no rales.  Abdominal: Soft.  Musculoskeletal: Normal range of motion.  Lymphadenopathy:    She has no cervical adenopathy.  Neurological: She is alert and oriented to person, place,  and time.  Skin: Skin is warm and dry.  Psychiatric: She has a normal mood and affect. Her behavior is normal. Thought content normal.      Assessment & Plan:   Kelly [name] was seen today for follow-up.  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 I am also having  her maintain her metoprolol succinate, apixaban, furosemide, topiramate, and clonazePAM.  Allergies as of 07/16/2017   No Known Allergies     Medication List        Accurate as of 07/16/17  5:43 PM. Always use your most recent med list.          apixaban 5 MG Tabs tablet Commonly known as:  ELIQUIS Take 1 tablet (5 mg total) by mouth 2 (two) times daily.   clonazePAM 0.5 MG tablet Commonly known as:  KLONOPIN Take 1/2 tablet twice a day for 1 week and 1/2 tablet QHS.   furosemide 40 MG tablet Commonly known as:  LASIX   metoprolol succinate 25 MG 24 hr tablet Commonly known as:  TOPROL-XL Take 1 tablet (25 mg total) by mouth daily.   topiramate 100 MG tablet Commonly known as:  TOPAMAX Take 1 tablet (100 mg total) at bedtime by mouth.        Follow-up: Return in about 6 weeks (around 08/27/2017).  Mechele ClaudeWarren Kasey Ewings, M.D.

## 2017-07-21 DIAGNOSIS — G603 Idiopathic progressive neuropathy: Secondary | ICD-10-CM | POA: Diagnosis not present

## 2017-07-21 DIAGNOSIS — R413 Other amnesia: Secondary | ICD-10-CM | POA: Diagnosis not present

## 2017-07-21 DIAGNOSIS — R451 Restlessness and agitation: Secondary | ICD-10-CM | POA: Diagnosis not present

## 2017-07-22 ENCOUNTER — Other Ambulatory Visit: Payer: Medicare Other

## 2017-07-22 DIAGNOSIS — Z79899 Other long term (current) drug therapy: Secondary | ICD-10-CM

## 2017-07-22 DIAGNOSIS — E531 Pyridoxine deficiency: Secondary | ICD-10-CM | POA: Diagnosis not present

## 2017-07-22 DIAGNOSIS — E559 Vitamin D deficiency, unspecified: Secondary | ICD-10-CM

## 2017-07-22 DIAGNOSIS — E538 Deficiency of other specified B group vitamins: Secondary | ICD-10-CM

## 2017-07-24 LAB — VITAMIN B1: Thiamine: 144.7 nmol/L (ref 66.5–200.0)

## 2017-07-25 LAB — T3 UPTAKE
Free Thyroxine Index: 1.8 (ref 1.2–4.9)
T3 UPTAKE RATIO: 27 % (ref 24–39)

## 2017-07-25 LAB — HGB A1C W/O EAG: HEMOGLOBIN A1C: 5.7 % — AB (ref 4.8–5.6)

## 2017-07-25 LAB — VITAMIN E
Vitamin E (Alpha Tocopherol): 8.2 mg/L — ABNORMAL LOW (ref 9.0–29.0)
Vitamin E(Gamma Tocopherol): 1.8 mg/L (ref 0.5–4.9)

## 2017-07-25 LAB — PE (RFX IFE), S
A/G RATIO SPE: 1.4 (ref 0.7–1.7)
ALBUMIN ELP: 3.8 g/dL (ref 2.9–4.4)
Alpha 1: 0.2 g/dL (ref 0.0–0.4)
Alpha 2: 0.5 g/dL (ref 0.4–1.0)
BETA: 0.9 g/dL (ref 0.7–1.3)
GAMMA GLOBULIN: 1.1 g/dL (ref 0.4–1.8)
Globulin, Total: 2.7 g/dL (ref 2.2–3.9)
TOTAL PROTEIN: 6.5 g/dL (ref 6.0–8.5)

## 2017-07-25 LAB — T4: T4, Total: 6.5 ug/dL (ref 4.5–12.0)

## 2017-07-25 LAB — ANTIEXTRACTABLE NUCLEAR AG: ENA RNP Ab: 0.9 AI (ref 0.0–0.9)

## 2017-07-25 LAB — VITAMIN B6: Vitamin B6: 5.5 ug/L (ref 2.0–32.8)

## 2017-07-25 LAB — ANA: Anti Nuclear Antibody(ANA): NEGATIVE

## 2017-07-25 LAB — TSH: TSH: 3.49 u[IU]/mL (ref 0.450–4.500)

## 2017-07-25 LAB — VITAMIN D 25 HYDROXY (VIT D DEFICIENCY, FRACTURES): Vit D, 25-Hydroxy: 32.8 ng/mL (ref 30.0–100.0)

## 2017-07-25 LAB — B12 AND FOLATE PANEL
FOLATE: 14.6 ng/mL (ref >3.0)
VITAMIN B 12: 708 pg/mL (ref 232–1245)

## 2017-07-30 ENCOUNTER — Other Ambulatory Visit: Payer: Self-pay

## 2017-07-30 DIAGNOSIS — R413 Other amnesia: Secondary | ICD-10-CM | POA: Diagnosis not present

## 2017-07-30 DIAGNOSIS — G603 Idiopathic progressive neuropathy: Secondary | ICD-10-CM | POA: Diagnosis not present

## 2017-07-30 DIAGNOSIS — R451 Restlessness and agitation: Secondary | ICD-10-CM | POA: Diagnosis not present

## 2017-07-30 MED ORDER — TOPIRAMATE 100 MG PO TABS: 100.0000 mg | ORAL_TABLET | Freq: Every day | ORAL | 0 refills | Status: DC

## 2017-08-03 ENCOUNTER — Other Ambulatory Visit: Payer: Self-pay

## 2017-08-03 ENCOUNTER — Emergency Department (HOSPITAL_COMMUNITY): Payer: Medicare Other

## 2017-08-03 ENCOUNTER — Encounter (HOSPITAL_COMMUNITY): Payer: Self-pay | Admitting: Emergency Medicine

## 2017-08-03 ENCOUNTER — Observation Stay (HOSPITAL_COMMUNITY)
Admission: EM | Admit: 2017-08-03 | Discharge: 2017-08-19 | Disposition: E | Payer: Medicare Other | Attending: Family Medicine | Admitting: Family Medicine

## 2017-08-03 DIAGNOSIS — I5032 Chronic diastolic (congestive) heart failure: Secondary | ICD-10-CM | POA: Insufficient documentation

## 2017-08-03 DIAGNOSIS — I110 Hypertensive heart disease with heart failure: Secondary | ICD-10-CM | POA: Diagnosis not present

## 2017-08-03 DIAGNOSIS — I4891 Unspecified atrial fibrillation: Secondary | ICD-10-CM | POA: Diagnosis not present

## 2017-08-03 DIAGNOSIS — R778 Other specified abnormalities of plasma proteins: Secondary | ICD-10-CM | POA: Insufficient documentation

## 2017-08-03 DIAGNOSIS — I700 Atherosclerosis of aorta: Secondary | ICD-10-CM | POA: Diagnosis present

## 2017-08-03 DIAGNOSIS — E785 Hyperlipidemia, unspecified: Secondary | ICD-10-CM | POA: Diagnosis present

## 2017-08-03 DIAGNOSIS — F0390 Unspecified dementia without behavioral disturbance: Secondary | ICD-10-CM | POA: Diagnosis not present

## 2017-08-03 DIAGNOSIS — I272 Pulmonary hypertension, unspecified: Secondary | ICD-10-CM | POA: Diagnosis present

## 2017-08-03 DIAGNOSIS — F418 Other specified anxiety disorders: Secondary | ICD-10-CM | POA: Diagnosis not present

## 2017-08-03 DIAGNOSIS — R079 Chest pain, unspecified: Secondary | ICD-10-CM | POA: Diagnosis not present

## 2017-08-03 DIAGNOSIS — I428 Other cardiomyopathies: Secondary | ICD-10-CM | POA: Diagnosis not present

## 2017-08-03 DIAGNOSIS — J9690 Respiratory failure, unspecified, unspecified whether with hypoxia or hypercapnia: Secondary | ICD-10-CM | POA: Insufficient documentation

## 2017-08-03 DIAGNOSIS — K219 Gastro-esophageal reflux disease without esophagitis: Secondary | ICD-10-CM | POA: Diagnosis not present

## 2017-08-03 DIAGNOSIS — Z79899 Other long term (current) drug therapy: Secondary | ICD-10-CM | POA: Insufficient documentation

## 2017-08-03 DIAGNOSIS — Z66 Do not resuscitate: Secondary | ICD-10-CM | POA: Diagnosis not present

## 2017-08-03 DIAGNOSIS — R0602 Shortness of breath: Secondary | ICD-10-CM | POA: Diagnosis not present

## 2017-08-03 DIAGNOSIS — Z791 Long term (current) use of non-steroidal anti-inflammatories (NSAID): Secondary | ICD-10-CM | POA: Insufficient documentation

## 2017-08-03 DIAGNOSIS — R7989 Other specified abnormal findings of blood chemistry: Secondary | ICD-10-CM | POA: Diagnosis present

## 2017-08-03 DIAGNOSIS — E7800 Pure hypercholesterolemia, unspecified: Secondary | ICD-10-CM | POA: Insufficient documentation

## 2017-08-03 DIAGNOSIS — N179 Acute kidney failure, unspecified: Secondary | ICD-10-CM | POA: Diagnosis not present

## 2017-08-03 DIAGNOSIS — I481 Persistent atrial fibrillation: Secondary | ICD-10-CM | POA: Diagnosis not present

## 2017-08-03 DIAGNOSIS — J449 Chronic obstructive pulmonary disease, unspecified: Secondary | ICD-10-CM | POA: Diagnosis not present

## 2017-08-03 DIAGNOSIS — E876 Hypokalemia: Secondary | ICD-10-CM | POA: Diagnosis present

## 2017-08-03 DIAGNOSIS — R072 Precordial pain: Secondary | ICD-10-CM | POA: Diagnosis not present

## 2017-08-03 DIAGNOSIS — I10 Essential (primary) hypertension: Secondary | ICD-10-CM | POA: Diagnosis present

## 2017-08-03 DIAGNOSIS — R0689 Other abnormalities of breathing: Secondary | ICD-10-CM

## 2017-08-03 HISTORY — DX: Unspecified dementia, unspecified severity, without behavioral disturbance, psychotic disturbance, mood disturbance, and anxiety: F03.90

## 2017-08-03 LAB — BASIC METABOLIC PANEL
Anion gap: 13 (ref 5–15)
BUN: 28 mg/dL — AB (ref 6–20)
CHLORIDE: 107 mmol/L (ref 101–111)
CO2: 23 mmol/L (ref 22–32)
Calcium: 9.7 mg/dL (ref 8.9–10.3)
Creatinine, Ser: 1.36 mg/dL — ABNORMAL HIGH (ref 0.44–1.00)
GFR calc non Af Amer: 36 mL/min — ABNORMAL LOW (ref >60)
GFR, EST AFRICAN AMERICAN: 42 mL/min — AB (ref >60)
Glucose, Bld: 147 mg/dL — ABNORMAL HIGH (ref 65–99)
POTASSIUM: 3.2 mmol/L — AB (ref 3.5–5.1)
SODIUM: 143 mmol/L (ref 135–145)

## 2017-08-03 LAB — CBC
HEMATOCRIT: 44.5 % (ref 36.0–46.0)
Hemoglobin: 14 g/dL (ref 12.0–15.0)
MCH: 30 pg (ref 26.0–34.0)
MCHC: 31.5 g/dL (ref 30.0–36.0)
MCV: 95.5 fL (ref 78.0–100.0)
Platelets: 258 10*3/uL (ref 150–400)
RBC: 4.66 MIL/uL (ref 3.87–5.11)
RDW: 15.3 % (ref 11.5–15.5)
WBC: 7.3 10*3/uL (ref 4.0–10.5)

## 2017-08-03 LAB — TROPONIN I
Troponin I: 0.03 ng/mL (ref <0.03)
Troponin I: 0.03 ng/mL (ref <0.03)
Troponin I: 0.03 ng/mL (ref <0.03)

## 2017-08-03 MED ORDER — POTASSIUM CHLORIDE CRYS ER 20 MEQ PO TBCR
40.0000 meq | EXTENDED_RELEASE_TABLET | ORAL | Status: AC
  Administered 2017-08-03 – 2017-08-04 (×2): 40 meq via ORAL
  Filled 2017-08-03 (×2): qty 2

## 2017-08-03 MED ORDER — PANTOPRAZOLE SODIUM 40 MG PO TBEC
40.0000 mg | DELAYED_RELEASE_TABLET | Freq: Every day | ORAL | Status: DC
  Administered 2017-08-03 – 2017-08-04 (×2): 40 mg via ORAL
  Filled 2017-08-03 (×2): qty 1

## 2017-08-03 MED ORDER — SODIUM CHLORIDE 0.9 % IV SOLN
INTRAVENOUS | Status: AC
  Administered 2017-08-03: 23:00:00 via INTRAVENOUS

## 2017-08-03 MED ORDER — METOPROLOL SUCCINATE ER 25 MG PO TB24
25.0000 mg | ORAL_TABLET | Freq: Every day | ORAL | Status: DC
  Administered 2017-08-04: 25 mg via ORAL
  Filled 2017-08-03: qty 1

## 2017-08-03 MED ORDER — MEMANTINE HCL 10 MG PO TABS
5.0000 mg | ORAL_TABLET | Freq: Every day | ORAL | Status: DC
  Administered 2017-08-04: 5 mg via ORAL
  Filled 2017-08-03: qty 1

## 2017-08-03 MED ORDER — ONDANSETRON HCL 4 MG/2ML IJ SOLN
4.0000 mg | Freq: Four times a day (QID) | INTRAMUSCULAR | Status: DC | PRN
Start: 2017-08-03 — End: 2017-08-05
  Administered 2017-08-03 – 2017-08-04 (×2): 4 mg via INTRAVENOUS
  Filled 2017-08-03 (×2): qty 2

## 2017-08-03 MED ORDER — IOPAMIDOL (ISOVUE-370) INJECTION 76%
75.0000 mL | Freq: Once | INTRAVENOUS | Status: AC | PRN
  Administered 2017-08-03: 70 mL via INTRAVENOUS

## 2017-08-03 MED ORDER — HYDRALAZINE HCL 25 MG PO TABS: 25.0000 mg | ORAL_TABLET | Freq: Four times a day (QID) | ORAL | Status: DC | PRN

## 2017-08-03 MED ORDER — ACETAMINOPHEN 325 MG PO TABS: 650.0000 mg | ORAL_TABLET | ORAL | Status: DC | PRN

## 2017-08-03 MED ORDER — ASPIRIN 81 MG PO CHEW
324.0000 mg | CHEWABLE_TABLET | Freq: Once | ORAL | Status: AC
  Administered 2017-08-03: 324 mg via ORAL
  Filled 2017-08-03: qty 4

## 2017-08-03 MED ORDER — APIXABAN 5 MG PO TABS
5.0000 mg | ORAL_TABLET | Freq: Two times a day (BID) | ORAL | Status: DC
  Administered 2017-08-03 – 2017-08-04 (×2): 5 mg via ORAL
  Filled 2017-08-03 (×2): qty 1

## 2017-08-03 MED ORDER — DOXEPIN HCL 25 MG PO CAPS
25.0000 mg | ORAL_CAPSULE | Freq: Two times a day (BID) | ORAL | Status: DC
  Administered 2017-08-03 – 2017-08-04 (×2): 25 mg via ORAL
  Filled 2017-08-03 (×7): qty 1

## 2017-08-03 MED ORDER — SODIUM CHLORIDE 0.9 % IV BOLUS (SEPSIS)
500.0000 mL | Freq: Once | INTRAVENOUS | Status: AC
  Administered 2017-08-03: 500 mL via INTRAVENOUS

## 2017-08-03 MED ORDER — METOPROLOL TARTRATE 5 MG/5ML IV SOLN
5.0000 mg | Freq: Once | INTRAVENOUS | Status: AC
  Administered 2017-08-03: 5 mg via INTRAVENOUS
  Filled 2017-08-03: qty 5

## 2017-08-03 NOTE — H&P (Signed)
TRH H&P    Patient Demographics:    Kelly [name], is a 76 y.o. female  MRN: [id number]  DOB - [birth year]  Admit Date - 08/27/2017  Referring MD/NP/PA: Dr. Lynelle Doctor  Outpatient Primary MD for the patient is Mechele Claude, MD  Patient coming from: home  Chief Complaint  Patient presents with  . Chest Pain      HPI:    Kelly [name]  is a 76 y.o. female, with history of atrial fibrillation on anti-coagulation with eliquis, non-ischemic cardiomyopathy if 40 of 45% as per Echo in May 2018 with grade one diastolic dysfunction, dementia, came to hospital with chest pain. Chest pain episodes have been intermittent and are associated with shortness of breath. Patient also has dyspnea on exertion. Patient has been belching a lot and has had poor PO intake for past few days. Chest pain lasts only for a few minutes. She complains of nausea but no vomiting. She denies abdominal pain. No dysuria or urgency or frequency of urination. No headache or blurred vision. Did not pass out.  In the ED, she was found to have mild elevation of troponin .03. Patient became hypotensive after she received IV metoprolol, CTA chest was done which was negative for aortic dissection.Cardiomegaly with right heart enlargement. Right atrium is in proportion more prominent than is the right ventricle.    Review of systems:      All other systems reviewed and are negative.   With Past History of the following :    Past Medical History:  Diagnosis Date  . Dementia   . Depression with anxiety   . Dysrhythmia    AFib  . HLD (hyperlipidemia)   . HTN (hypertension)   . Migraine   . Mild cognitive impairment 04/2017  . NICM (nonischemic cardiomyopathy) (HCC)    a. LHC 8/02: EF 55%, 2+ MR, normal cors;  b.  Echo 3/10: EF 50-55%, mild diast dysfn, mild MVP (ant leaflet), mild MR, mild TR, small pericard eff.;   c.  Myoview 3/10:   EF 69%, no ischemia;  d. Echo 9/13: EF 50-55%, Gr 1 diast dysfn, mild prolapse of ant MV leaflet;   e. Myoview 9/13: EF 64%, NL perfusion;  f.  Echo (11/15):  EF 50-55%, normal diastolic function, mild MR  . Pulmonary HTN (HCC)   . Vertigo       Past Surgical History:  Procedure Laterality Date  . ABDOMINAL HYSTERECTOMY    . APPENDECTOMY    . CARDIOVERSION N/A 07/05/2016   Procedure: CARDIOVERSION;  Surgeon: Jonelle Sidle, MD;  Location: [facility];  Service: Cardiovascular;  Laterality: N/A;  . CHOLECYSTECTOMY    . EYE SURGERY Bilateral    cataract removal with lens implants  . IR GENERIC HISTORICAL  05/13/2016   IR RADIOLOGY PERIPHERAL GUIDED IV START 05/13/2016 Malachy Moan, MD [facility]  . IR GENERIC HISTORICAL  05/13/2016   IR US GUIDE VASC ACCESS RIGHT 05/13/2016 Malachy Moan, MD [facility]      Social History:      Social History  Tobacco Use  . Smoking status: Never Smoker  . Smokeless tobacco: Never Used  Substance Use Topics  . Alcohol use: No       Family History :     Family History  Problem Relation Age of Onset  . Asthma Mother   . COPD Mother   . Heart disease Mother   . Thrombosis Father   . Cancer Sister   . Heart disease Brother   . Heart attack Brother   . Stroke Neg Hx       Home Medications:   Prior to Admission medications   Medication Sig Start Date End Date Taking? Authorizing Provider  apixaban (ELIQUIS) 5 MG TABS tablet Take 1 tablet (5 mg total) by mouth 2 (two) times daily. 05/05/17  Yes Rollene Rotunda, MD  doxepin (SINEQUAN) 25 MG capsule Take 1 capsule by mouth 2 (two) times daily. 07/31/17  Yes [provider]  memantine (NAMENDA) 5 MG tablet Take 1 tablet by mouth daily. 08/01/17  Yes [provider]  metoprolol succinate (TOPROL-XL) 25 MG 24 hr tablet Take 1 tablet (25 mg total) by mouth daily. 04/25/17  Yes Rollene Rotunda, MD     Allergies:    No Known Allergies   Physical Exam:    Vitals  Blood pressure 110/75, pulse 76, temperature 97.6 F (36.4 C), temperature source Oral, resp. rate (!) 22, height 5\' 2"  (1.575 m), weight 69.9 kg (154 lb), SpO2 97 %.  1.  General: appears in no acute distress  2. Psychiatric:  Intact judgement and  insight, awake alert, oriented x 3.  3. Neurologic: No focal neurological deficits, all cranial nerves intact.Strength 5/5 all 4 extremities, sensation intact all 4 extremities, plantars down going.  4. Eyes :  anicteric sclerae, moist conjunctivae with no lid lag. PERRLA.  5. ENMT:  Oropharynx clear with moist mucous membranes and good dentition  6. Neck:  supple, no cervical lymphadenopathy appriciated, No thyromegaly.  + Jugular venous distention  7. Respiratory : Normal respiratory effort, good air movement bilaterally,clear to  auscultation bilaterally  8. Cardiovascular : RRR, no gallops, rubs or murmurs, no leg edema  9. Gastrointestinal:  Positive bowel sounds, abdomen soft, non-tender to palpation,no hepatosplenomegaly, no rigidity or guarding       10. Skin:  No cyanosis, normal texture and turgor, no rash, lesions or ulcers  11.Musculoskeletal:  Good muscle tone,  joints appear normal , no effusions,  normal range of motion    Data Review:    CBC Recent Labs  Lab 07/30/2017 1543  WBC 7.3  HGB 14.0  HCT 44.5  PLT 258  MCV 95.5  MCH 30.0  MCHC 31.5  RDW 15.3   ------------------------------------------------------------------------------------------------------------------  Chemistries  Recent Labs  Lab 08/02/2017 1543  NA 143  K 3.2*  CL 107  CO2 23  GLUCOSE 147*  BUN 28*  CREATININE 1.36*  CALCIUM 9.7   ------------------------------------------------------------------------------------------------------------------  ------------------------------------------------------------------------------------------------------------------ GFR: Estimated Creatinine Clearance: 31.2 mL/min  (A) (by C-G formula based on SCr of 1.36 mg/dL (H)). Liver Function Tests: No results for input(s): AST, ALT, ALKPHOS, BILITOT, PROT, ALBUMIN in the last 168 hours. No results for input(s): LIPASE, AMYLASE in the last 168 hours. No results for input(s): AMMONIA in the last 168 hours. Coagulation Profile: No results for input(s): INR, PROTIME in the last 168 hours. Cardiac Enzymes: Recent Labs  Lab 07/20/2017 1605 07/20/2017 1830  TROPONINI 0.03* 0.03*   BNP (last 3 results) No results for input(s): PROBNP in the last  8760 hours. HbA1C: No results for input(s): HGBA1C in the last 72 hours. CBG: No results for input(s): GLUCAP in the last 168 hours. Lipid Profile: No results for input(s): CHOL, HDL, LDLCALC, TRIG, CHOLHDL, LDLDIRECT in the last 72 hours. Thyroid Function Tests: No results for input(s): TSH, T4TOTAL, FREET4, T3FREE, THYROIDAB in the last 72 hours. Anemia Panel: No results for input(s): VITAMINB12, FOLATE, FERRITIN, TIBC, IRON, RETICCTPCT in the last 72 hours.  --------------------------------------------------------------------------------------------------------------- Urine analysis:    Component Value Date/Time   BILIRUBINUR neg 02/16/2015 1210   PROTEINUR neg 02/16/2015 1210   UROBILINOGEN negative 02/16/2015 1210   NITRITE neg 02/16/2015 1210   LEUKOCYTESUR moderate (2+) (A) 02/16/2015 1210      Imaging Results:    Dg Chest 2 View  Result Date: 07/29/2017 CLINICAL DATA:  Chest pain EXAM: CHEST  2 VIEW COMPARISON:  08/24/2016 FINDINGS: Cardiomegaly. There is hyperinflation of the lungs compatible with COPD. Heart and mediastinal contours are within normal limits. No focal opacities or effusions. No acute bony abnormality. IMPRESSION: Cardiomegaly.  COPD.  No active disease. Electronically Signed   By: Charlett NoseKevin  Dover M.D.   On: 08/12/2017 16:24   Ct Angio Chest Aorta W And/or Wo Contrast  Result Date: 07/24/2017 CLINICAL DATA:  Shortness of breath and chest  pain EXAM: CT ANGIOGRAPHY CHEST WITH CONTRAST TECHNIQUE: Initially, axial CT images were obtained through the chest without intravenous contrast material administration. Multidetector CT imaging of the chest was performed using the standard protocol during bolus administration of intravenous contrast. Multiplanar CT image reconstructions and MIPs were obtained to evaluate the vascular anatomy. CONTRAST:  70mL ISOVUE-370 IOPAMIDOL (ISOVUE-370) INJECTION 76% COMPARISON:  Chest CT angiogram May 11, 2016; chest radiograph August 13, 2017 FINDINGS: Cardiovascular: On the noncontrast enhanced study, there is no intramural hematoma in the thoracic aorta. There is no appreciable thoracic aortic aneurysm or dissection. The visualized great vessels appear unremarkable. There are foci of atherosclerotic calcification in the aorta. There is right heart enlargement. There is no appreciable pericardial effusion. There is no appreciable pulmonary embolus. Mediastinum/Nodes: Thyroid appears unremarkable. There is no appreciable thoracic adenopathy. No esophageal lesions are evident. Lungs/Pleura: There are areas of mild atelectatic change in the lingula and left lower lobe. There is no edema or consolidation. Areas of patchy mosaic opacity noted on prior CT are no longer appreciable. There is currently no pleural effusion or pleural thickening. Upper Abdomen: There is reflux of contrast into the inferior vena cava and hepatic veins. There are surgical clips in the gallbladder fossa. There are foci of atherosclerotic calcification in the aorta and proximal renal arteries. Musculoskeletal: There are no blastic or lytic bone lesions. Review of the MIP images confirms the above findings. IMPRESSION: 1. No thoracic aortic aneurysm or dissection. There are foci of atherosclerotic calcification in the aorta. 2.  No evident pulmonary embolus. 3. Cardiomegaly with right heart enlargement. Right atrium is in proportion more prominent  than is the right ventricle. 4. Reflux of contrast in the inferior vena cava and hepatic veins suggests increase in right heart pressure. 5. Areas of mild atelectatic change in the lingula and left lower lobe. No lung edema or consolidation. 6.  No evident adenopathy. Aortic Atherosclerosis (ICD10-I70.0). Electronically Signed   By: Bretta BangWilliam  Woodruff III M.D.   On: 07/21/2017 19:49    My personal review of EKG: Rhythm-atrial fibrillation    Assessment & Plan:    Active Problems:   Chest pain   1. Chest pain  -appears atypical as patient  has been having belching and acid reflux symptoms. Will place under observation and telemetry. Cycle troponin Q6 hours times three. Chest pain has resolved at this time. 2. Acute kidney injury-likely from poor people intake, will start dental IV hydration with normal saline at 75 ML per hour for 12 hours. Will be cautious with fluid replacement as patient has history of grade one diastolic dysfunction. Follow BMP in am. 3. Hypokalemia-potassium 3.2, replace potassium and check BMP in am. 4. Chronic diastolic CHF/non-ischemic cardiomyopathy-patient's last echo from May 2018 showed EF in the range of 40 to 45%, mild diffuse hypokinesis. Today's CTA just shows reflux of contrast in the inferior vena cava and hepatic vein suggest increasing the right heart pressure. Will obtain echocardiogram in a.m. Will consult cardiology for further recommendations. 5. Gerd-start Protonix 40 mg PO daily. Will continue PPI as outpatient 6. Atrial fibrillation-heart rate is controlled, continue metoprolol, and eliquis for anticoagulation. 7. Hypertension-blood pressure is elevated, continue metoprolol. Start hydralazine 25 mg PO Q6 hours PRN for BP more than 160/100      DVT Prophylaxis-   eliquis  AM Labs Ordered, also please review Full Orders  Family Communication: Admission, patients condition and plan of care including tests being ordered have been discussed with the  patient, husband and son at bedside who indicate understanding and agree with the plan and Code Status.  Code Status: DNR  Admission status: observation  Time spent in minutes : 60 minutes   Meredeth Ide M.D on Sep 01, 2017 at 9:07 PM  Between 7am to 7pm - Pager - [phone]. After 7pm go to [email or web address] - password [facility]  [facility] - Office  [phone]

## 2017-08-03 NOTE — ED Notes (Signed)
Date and time results received: 08/21/17 1646   Test: Troponin  Critical Value: 0.03  Name of Provider Notified: Dr. Lynelle Doctor  Orders Received? Or Actions Taken?: No new orders given at this time.

## 2017-08-03 NOTE — ED Triage Notes (Signed)
Patient c/o mid-sternal chest pain, non-radiating. Per patient pain x2 days with shortness of breath and nausea. Patient has hx of enlarged heart and arrhythmia (Afib). Denies and fevers, swelling, or dizziness. Per patient nonproductive cough.

## 2017-08-03 NOTE — ED Notes (Signed)
Pt provided water to drink per request for something to drink

## 2017-08-03 NOTE — ED Notes (Signed)
Pt would not answer this nurse when asked about her symptoms, pt referred to having a lot of problems and has seen many doctors for issues.  Pt denies cp at present.

## 2017-08-03 NOTE — ED Provider Notes (Signed)
[facility] EMERGENCY DEPARTMENT Provider Note   CSN: [id number] Arrival date & time: 08-04-17  1528     History   Chief Complaint Chief Complaint  Patient presents with  . Chest Pain    HPI Kelly [name] is a 76 y.o. female.  HPI Patient presents to the emergency room for evaluation of chest pain.  Patient has a history of nonischemic cardiomyopathy.  She also has history of atrial fibrillation.  Cording to the patient as well as her family members who are assisting with a history of the patient has been having trouble with intermittent shortness of breath, and midsternal chest pain for the last couple of days.  The symptoms come and go and last may be several minutes at a time.  She has had some nausea as well.  Patient denied any trouble with fevers or dizziness.  She had a slight cough but is been nonproductive.  Patient does have a history of atrial fibrillation and nonischemic cardiomyopathy. Past Medical History:  Diagnosis Date  . Dementia   . Depression with anxiety   . Dysrhythmia    AFib  . HLD (hyperlipidemia)   . HTN (hypertension)   . Migraine   . Mild cognitive impairment 04/2017  . NICM (nonischemic cardiomyopathy) (HCC)    a. LHC 8/02: EF 55%, 2+ MR, normal cors;  b.  Echo 3/10: EF 50-55%, mild diast dysfn, mild MVP (ant leaflet), mild MR, mild TR, small pericard eff.;   c.  Myoview 3/10:  EF 69%, no ischemia;  d. Echo 9/13: EF 50-55%, Gr 1 diast dysfn, mild prolapse of ant MV leaflet;   e. Myoview 9/13: EF 64%, NL perfusion;  f.  Echo (11/15):  EF 50-55%, normal diastolic function, mild MR  . Pulmonary HTN (HCC)   . Vertigo     Patient Active Problem List   Diagnosis Date Noted  . Dizziness 06/03/2017  . Essential hypertension 02/18/2017  . Persistent atrial fibrillation (HCC)   . Skin lesion of left leg 06/03/2016  . Aortic atherosclerosis (HCC) 05/22/2016  . Dyspnea 05/10/2016  . Depression with anxiety 12/12/2010  . Pulmonary HTN (HCC) 12/12/2010   . Migraine 12/12/2010  . NICM (nonischemic cardiomyopathy) (HCC) 08/16/2010  . Pure hypercholesterolemia 07/21/2009    Past Surgical History:  Procedure Laterality Date  . ABDOMINAL HYSTERECTOMY    . APPENDECTOMY    . CARDIOVERSION N/A 07/05/2016   Procedure: CARDIOVERSION;  Surgeon: Jonelle Sidle, MD;  Location: [facility];  Service: Cardiovascular;  Laterality: N/A;  . CHOLECYSTECTOMY    . EYE SURGERY Bilateral    cataract removal with lens implants  . IR GENERIC HISTORICAL  05/13/2016   IR RADIOLOGY PERIPHERAL GUIDED IV START 05/13/2016 Malachy Moan, MD [facility]  . IR GENERIC HISTORICAL  05/13/2016   IR US GUIDE VASC ACCESS RIGHT 05/13/2016 Malachy Moan, MD [facility]    OB History    No data available       Home Medications    Prior to Admission medications   Medication Sig Start Date End Date Taking? Authorizing Provider  apixaban (ELIQUIS) 5 MG TABS tablet Take 1 tablet (5 mg total) by mouth 2 (two) times daily. 05/05/17  Yes Rollene Rotunda, MD  doxepin (SINEQUAN) 25 MG capsule Take 1 capsule by mouth 2 (two) times daily. 07/31/17  Yes [provider]  memantine (NAMENDA) 5 MG tablet Take 1 tablet by mouth daily. 08/01/17  Yes [provider]  metoprolol succinate (TOPROL-XL) 25 MG 24  hr tablet Take 1 tablet (25 mg total) by mouth daily. 04/25/17  Yes Rollene RotundaHochrein, James, MD    Family History Family History  Problem Relation Age of Onset  . Asthma Mother   . COPD Mother   . Heart disease Mother   . Thrombosis Father   . Cancer Sister   . Heart disease Brother   . Heart attack Brother   . Stroke Neg Hx     Social History Social History   Tobacco Use  . Smoking status: Never Smoker  . Smokeless tobacco: Never Used  Substance Use Topics  . Alcohol use: No  . Drug use: No     Allergies   Patient has no known allergies.   Review of Systems Review of Systems  All other systems reviewed and are negative.    Physical  Exam Updated Vital Signs BP 110/75   Pulse 76   Temp 97.6 F (36.4 C) (Oral)   Resp (!) 21   Ht 1.575 m (5\' 2" )   Wt 69.9 kg (154 lb)   SpO2 97%   BMI 28.17 kg/m   Physical Exam  Constitutional: She appears well-developed and well-nourished. No distress.  HENT:  Head: Normocephalic and atraumatic.  Right Ear: External ear normal.  Left Ear: External ear normal.  Eyes: Conjunctivae are normal. Right eye exhibits no discharge. Left eye exhibits no discharge. No scleral icterus.  Neck: Neck supple. No tracheal deviation present.  Cardiovascular: Normal rate, regular rhythm and intact distal pulses.  Pulmonary/Chest: Effort normal and breath sounds normal. No stridor. No respiratory distress. She has no wheezes. She has no rales.  Abdominal: Soft. Bowel sounds are normal. She exhibits no distension. There is no tenderness. There is no rebound and no guarding.  Musculoskeletal: She exhibits no edema or tenderness.  Neurological: She is alert. She has normal strength. No cranial nerve deficit (no facial droop, extraocular movements intact, no slurred speech) or sensory deficit. She exhibits normal muscle tone. She displays no seizure activity. Coordination normal.  Skin: Skin is warm and dry. No rash noted.  Psychiatric: She has a normal mood and affect.  Nursing note and vitals reviewed.    ED Treatments / Results  Labs (all labs ordered are listed, but only abnormal results are displayed) Labs Reviewed  BASIC METABOLIC PANEL - Abnormal; Notable for the following components:      Result Value   Potassium 3.2 (*)    Glucose, Bld 147 (*)    BUN 28 (*)    Creatinine, Ser 1.36 (*)    GFR calc non Af Amer 36 (*)    GFR calc Af Amer 42 (*)    All other components within normal limits  TROPONIN I - Abnormal; Notable for the following components:   Troponin I 0.03 (*)    All other components within normal limits  TROPONIN I - Abnormal; Notable for the following components:    Troponin I 0.03 (*)    All other components within normal limits  CBC    EKG  EKG Interpretation  Date/Time:  Sunday August 03 2017 15:36:23 EST Ventricular Rate:  109 PR Interval:    QRS Duration: 112 QT Interval:  290 QTC Calculation: 390 R Axis:   -55 Text Interpretation:  Atrial fibrillation with rapid ventricular response with premature ventricular or aberrantly conducted complexes Left axis deviation Low voltage QRS Inferior infarct , age undetermined Cannot rule out Anteroseptal infarct , age undetermined Abnormal ECG SINCE LAST TRACING HEART RATE HAS  INCREASED Pt was in a fib on 30 May 2017 Reconfirmed by Linwood Dibbles ([id number]) on 2017/08/08 4:17:04 PM       Radiology Dg Chest 2 View  Result Date: 08-08-17 CLINICAL DATA:  Chest pain EXAM: CHEST  2 VIEW COMPARISON:  08/24/2016 FINDINGS: Cardiomegaly. There is hyperinflation of the lungs compatible with COPD. Heart and mediastinal contours are within normal limits. No focal opacities or effusions. No acute bony abnormality. IMPRESSION: Cardiomegaly.  COPD.  No active disease. Electronically Signed   By: Charlett Nose M.D.   On: 2017/08/08 16:24   Ct Angio Chest Aorta W And/or Wo Contrast  Result Date: Aug 08, 2017 CLINICAL DATA:  Shortness of breath and chest pain EXAM: CT ANGIOGRAPHY CHEST WITH CONTRAST TECHNIQUE: Initially, axial CT images were obtained through the chest without intravenous contrast material administration. Multidetector CT imaging of the chest was performed using the standard protocol during bolus administration of intravenous contrast. Multiplanar CT image reconstructions and MIPs were obtained to evaluate the vascular anatomy. CONTRAST:  70mL ISOVUE-370 IOPAMIDOL (ISOVUE-370) INJECTION 76% COMPARISON:  Chest CT angiogram May 11, 2016; chest radiograph August 13, 2017 FINDINGS: Cardiovascular: On the noncontrast enhanced study, there is no intramural hematoma in the thoracic aorta. There is no appreciable  thoracic aortic aneurysm or dissection. The visualized great vessels appear unremarkable. There are foci of atherosclerotic calcification in the aorta. There is right heart enlargement. There is no appreciable pericardial effusion. There is no appreciable pulmonary embolus. Mediastinum/Nodes: Thyroid appears unremarkable. There is no appreciable thoracic adenopathy. No esophageal lesions are evident. Lungs/Pleura: There are areas of mild atelectatic change in the lingula and left lower lobe. There is no edema or consolidation. Areas of patchy mosaic opacity noted on prior CT are no longer appreciable. There is currently no pleural effusion or pleural thickening. Upper Abdomen: There is reflux of contrast into the inferior vena cava and hepatic veins. There are surgical clips in the gallbladder fossa. There are foci of atherosclerotic calcification in the aorta and proximal renal arteries. Musculoskeletal: There are no blastic or lytic bone lesions. Review of the MIP images confirms the above findings. IMPRESSION: 1. No thoracic aortic aneurysm or dissection. There are foci of atherosclerotic calcification in the aorta. 2.  No evident pulmonary embolus. 3. Cardiomegaly with right heart enlargement. Right atrium is in proportion more prominent than is the right ventricle. 4. Reflux of contrast in the inferior vena cava and hepatic veins suggests increase in right heart pressure. 5. Areas of mild atelectatic change in the lingula and left lower lobe. No lung edema or consolidation. 6.  No evident adenopathy. Aortic Atherosclerosis (ICD10-I70.0). Electronically Signed   By: Bretta Bang III M.D.   On: August 08, 2017 19:49    Procedures Procedures (including critical care time)  Medications Ordered in ED Medications  metoprolol tartrate (LOPRESSOR) injection 5 mg (5 mg Intravenous Given 08-08-2017 1716)  aspirin chewable tablet 324 mg (324 mg Oral Given 2017-08-08 1716)  sodium chloride 0.9 % bolus 500 mL (500 mLs  Intravenous New Bag/Given 08-Aug-2017 1900)  iopamidol (ISOVUE-370) 76 % injection 75 mL (70 mLs Intravenous Contrast Given 08-08-2017 1913)     Initial Impression / Assessment and Plan / ED Course  I have reviewed the triage vital signs and the nursing notes.  Pertinent labs & imaging results that were available during my care of the patient were reviewed by me and considered in my medical decision making (see chart for details).  Clinical Course as of Aug 03 2010  Sun Aug 03, 2017  1730 Patient was feeling well.  She wanted to go home.  Her initial troponin was 0.03.  Plan is to repeat troponin and reassess  [JK]  1853 I was notified that pt's blood pressure is low.  She is complaining of chest pain again.  BP in the high 80s.  Will give a fluid bolus.   Repeat trop is pending.  Will CT scan her chest to rule out aortic disease.  [JK]    Clinical Course User Index [JK] Linwood Dibbles, MD    Patient presents to the emergency room for evaluation of chest pain.  She has a history of atrial fibrillation and nonischemic cardiomyopathy.  Patient has a slightly elevated troponin at 0.03.  Patient had an episode of hypotension.  Because of her persistent pain I was concerned about the possibility of aortic dissection.  CT Angie was performed.  No acute abnormalities noted.  There is suggestion of increased pressures in the right side of heart.  I will consult the medical service for admission, serial cardiac enzymes and possible cardiac consultation tomorrow  Final Clinical Impressions(s) / ED Diagnoses   Final diagnoses:  Chest pain, unspecified type  Atrial fibrillation, unspecified type [facility])      Linwood Dibbles, MD 07/22/2017 2011

## 2017-08-03 NOTE — ED Notes (Signed)
Pt restless in bed, when asked what's wrong, pt replies that she wants to go home.  Explained to pt needing trop repeated.  Pt will not cooperative in keeping her arm still with BP readings.  Pt's husband and son in room.

## 2017-08-03 NOTE — ED Notes (Signed)
Pt complaining and wants to go home, explained to pt that more bloodwork to be done and results needed

## 2017-08-04 ENCOUNTER — Other Ambulatory Visit: Payer: Self-pay

## 2017-08-04 ENCOUNTER — Observation Stay (HOSPITAL_BASED_OUTPATIENT_CLINIC_OR_DEPARTMENT_OTHER): Payer: Medicare Other

## 2017-08-04 DIAGNOSIS — R778 Other specified abnormalities of plasma proteins: Secondary | ICD-10-CM | POA: Diagnosis present

## 2017-08-04 DIAGNOSIS — I4891 Unspecified atrial fibrillation: Secondary | ICD-10-CM | POA: Diagnosis not present

## 2017-08-04 DIAGNOSIS — I272 Pulmonary hypertension, unspecified: Secondary | ICD-10-CM

## 2017-08-04 DIAGNOSIS — R0609 Other forms of dyspnea: Secondary | ICD-10-CM

## 2017-08-04 DIAGNOSIS — R079 Chest pain, unspecified: Secondary | ICD-10-CM | POA: Diagnosis not present

## 2017-08-04 DIAGNOSIS — E876 Hypokalemia: Secondary | ICD-10-CM | POA: Diagnosis present

## 2017-08-04 DIAGNOSIS — F0390 Unspecified dementia without behavioral disturbance: Secondary | ICD-10-CM | POA: Diagnosis present

## 2017-08-04 DIAGNOSIS — I340 Nonrheumatic mitral (valve) insufficiency: Secondary | ICD-10-CM

## 2017-08-04 DIAGNOSIS — R7989 Other specified abnormal findings of blood chemistry: Secondary | ICD-10-CM | POA: Diagnosis present

## 2017-08-04 DIAGNOSIS — E785 Hyperlipidemia, unspecified: Secondary | ICD-10-CM | POA: Diagnosis present

## 2017-08-04 DIAGNOSIS — R748 Abnormal levels of other serum enzymes: Secondary | ICD-10-CM | POA: Diagnosis not present

## 2017-08-04 LAB — BASIC METABOLIC PANEL
ANION GAP: 13 (ref 5–15)
BUN: 30 mg/dL — ABNORMAL HIGH (ref 6–20)
CHLORIDE: 108 mmol/L (ref 101–111)
CO2: 18 mmol/L — ABNORMAL LOW (ref 22–32)
Calcium: 8.6 mg/dL — ABNORMAL LOW (ref 8.9–10.3)
Creatinine, Ser: 1.62 mg/dL — ABNORMAL HIGH (ref 0.44–1.00)
GFR calc Af Amer: 34 mL/min — ABNORMAL LOW (ref >60)
GFR, EST NON AFRICAN AMERICAN: 29 mL/min — AB (ref >60)
GLUCOSE: 110 mg/dL — AB (ref 65–99)
POTASSIUM: 4.1 mmol/L (ref 3.5–5.1)
SODIUM: 139 mmol/L (ref 135–145)

## 2017-08-04 LAB — ECHOCARDIOGRAM COMPLETE
AVLVOTPG: 1 mmHg
CHL CUP DOP CALC LVOT VTI: 7.8 cm
CHL CUP RV SYS PRESS: 20 mmHg
CHL CUP STROKE VOLUME: 15 mL
CHL CUP TV REG PEAK VELOCITY: 171 cm/s
E decel time: 127 msec
FS: 10 % — AB (ref 28–44)
Height: 62 in
IVS/LV PW RATIO, ED: 1
LA ID, A-P, ES: 35 mm
LADIAMINDEX: 1.98 cm/m2
LAVOL: 60.2 mL
LAVOLA4C: 56.1 mL
LAVOLIN: 34 mL/m2
LDCA: 2.84 cm2
LEFT ATRIUM END SYS DIAM: 35 mm
LV PW d: 8.16 mm — AB (ref 0.6–1.1)
LV SIMPSON'S DISK: 23
LV dias vol index: 36 mL/m2
LV dias vol: 64 mL (ref 46–106)
LVOT diameter: 19 mm
LVOT peak vel: 52.2 cm/s
LVOTSV: 22 mL
LVSYSVOL: 50 mL — AB (ref 14–42)
LVSYSVOLIN: 28 mL/m2
MV Dec: 127
MVPG: 2 mmHg
MVPKEVEL: 77.5 m/s
RV TAPSE: 6.99 mm
TR max vel: 171 cm/s
Weight: 2470.4 oz

## 2017-08-04 LAB — TROPONIN I
TROPONIN I: 0.07 ng/mL — AB (ref <0.03)
Troponin I: 0.03 ng/mL (ref <0.03)

## 2017-08-04 MED ORDER — HYDROCODONE-ACETAMINOPHEN 5-325 MG PO TABS
1.0000 | ORAL_TABLET | Freq: Four times a day (QID) | ORAL | Status: DC | PRN
  Administered 2017-08-04 (×2): 1 via ORAL
  Filled 2017-08-04 (×2): qty 1

## 2017-08-04 NOTE — Progress Notes (Signed)
SLP Cancellation Note  Patient Details Name: Kelly [name] MRN: [id number] DOB: 12/01/1940   Cancelled treatment:       Reason Eval/Treat Not Completed: Fatigue/lethargy limiting ability to participate; Pt sleeping soundly when SLP arrived for BSE. SLP spoke with Pt's husband at length regarding her recent diagnosis of dementia (in the last 3 months), chest pain inhibiting her ability to walk several miles like she did previously, nausea, and her intermittent swallowing difficulties. Pt has been eating very little in the last week, per husband. SLP will check back tomorrow when she is awake/alert. She may benefit from objective assessment given her symptoms, however her husband questions whether Pt would be willing/able to participate. SLP will come back late AM/early afternoon.  Thank you,  Havery Moros, CCC-SLP [phone]    PORTER,DABNEY 08/04/2017, 7:42 PM

## 2017-08-04 NOTE — Progress Notes (Signed)
PROGRESS NOTE  Kelly [name]  ZOX:[id number]  DOB: 01/08/1941  DOA: 09-27-2016 PCP: Mechele ClaudeStacks, [place], MD   Brief Admission Hx: Kelly [name]  is a 76 y.o. female, with history of atrial fibrillation on anti-coagulation with eliquis, non-ischemic cardiomyopathy if 40 of 45% as per Echo in May 2018 with grade one diastolic dysfunction, dementia, came to hospital with chest pain. Chest pain episodes have been intermittent and are associated with shortness of breath. Patient also has dyspnea on exertion. Patient has been belching a lot and has had poor PO intake for past few days. Chest pain lasts only for a few minutes.  MDM/Assessment & Plan:   1. Chest pain -appears atypical as patient has been having belching and acid reflux symptoms. Her troponin has only had slighty elevated but with mild bump this afternoon to 0.07 will defer further testing and ischemic workup to cardiology team. Await echo to assess for wall motion abnormality.   2. Acute kidney injury-likely from poor oral intake and I suspect underlying chronic kidney disease, Continue IV hydration with normal saline. Recheck in AM. Will be cautious with fluid replacement given history of diastolic dysfunction CHF.   3. Hypokalemia-Repleted.  4. Chronic diastolic CHF/non-ischemic cardiomyopathy-patient's last echo from May 2018 showed EF in the range of 40 to 45%, mild diffuse hypokinesis. Today's CTA just shows reflux of contrast in the inferior vena cava and hepatic vein suggest increasing the right heart pressure. Awaiting echocardiogram.  Await cardiology for further recommendations. 5. Gerd-start Protonix 40 mg PO daily. Will continue PPI as outpatient 6. Atrial fibrillation-heart rate is controlled, continue metoprolol, and eliquis for anticoagulation. 7. Hypertension-blood pressure is elevated, continue metoprolol. Start hydralazine 25 mg PO Q6 hours PRN for BP more than 160/100  DVT prophylaxis: eliquis Code Status: DNR Family  Communication: bedside Disposition Plan: Home pending cardiology recommendations   Consultants:  cardiology  Subjective: Pt says that she has no symptoms however she has dementia and reports her symptoms as occasional SOB and chest pressure.   Objective: Vitals:   2017-01-28 2300 08/04/17 0600 08/04/17 1037 08/04/17 1219  BP: 98/62 106/70 122/66 120/76  Pulse: 62 96  86  Resp: 16 16  14   Temp: 98.1 F (36.7 C) 98.3 F (36.8 C)    TempSrc: Oral Oral    SpO2: 96% 98%    Weight: 70 kg (154 lb 6.4 oz)     Height: 5\' 2"  (1.575 m)       Intake/Output Summary (Last 24 hours) at 08/04/2017 1441 Last data filed at 08/04/2017 0300 Gross per 24 hour  Intake 1275 ml  Output -  Net 1275 ml   Filed Weights   2017-01-28 1535 2017-01-28 2300  Weight: 69.9 kg (154 lb) 70 kg (154 lb 6.4 oz)   REVIEW OF SYSTEMS  As per history otherwise all reviewed and reported negative  Exam:  General exam: sitting up in chair, awake, alert, demented, NAD. Cooperative. Pleasant.  Respiratory system: Clear. No increased work of breathing. Cardiovascular system: S1 & S2 heard, irregularly irregular. No JVD, murmurs, gallops, clicks or pedal edema. Gastrointestinal system: Abdomen is nondistended, soft and nontender. Normal bowel sounds heard. Central nervous system: Alert and oriented. No focal neurological deficits. Extremities: no CCE.  Data Reviewed: Basic Metabolic Panel: Recent Labs  Lab 2017-01-28 1543 08/04/17 0445  NA 143 139  K 3.2* 4.1  CL 107 108  CO2 23 18*  GLUCOSE 147* 110*  BUN 28* 30*  CREATININE 1.36* 1.62*  CALCIUM 9.7 8.6*  Liver Function Tests: No results for input(s): AST, ALT, ALKPHOS, BILITOT, PROT, ALBUMIN in the last 168 hours. No results for input(s): LIPASE, AMYLASE in the last 168 hours. No results for input(s): AMMONIA in the last 168 hours. CBC: Recent Labs  Lab 08/02/2017 1543  WBC 7.3  HGB 14.0  HCT 44.5  MCV 95.5  PLT 258   Cardiac Enzymes: Recent  Labs  Lab 08/13/2017 1605 08/13/2017 1830 08/14/2017 2238 08/04/17 0445 08/04/17 1257  TROPONINI 0.03* 0.03* 0.03* 0.03* 0.07*   CBG (last 3)  No results for input(s): GLUCAP in the last 72 hours. No results found for this or any previous visit (from the past 240 hour(s)).   Studies: Dg Chest 2 View  Result Date: 08/18/2017 CLINICAL DATA:  Chest pain EXAM: CHEST  2 VIEW COMPARISON:  08/24/2016 FINDINGS: Cardiomegaly. There is hyperinflation of the lungs compatible with COPD. Heart and mediastinal contours are within normal limits. No focal opacities or effusions. No acute bony abnormality. IMPRESSION: Cardiomegaly.  COPD.  No active disease. Electronically Signed   By: Charlett Nose M.D.   On: 08/11/2017 16:24   Ct Angio Chest Aorta W And/or Wo Contrast  Result Date: 08/01/2017 CLINICAL DATA:  Shortness of breath and chest pain EXAM: CT ANGIOGRAPHY CHEST WITH CONTRAST TECHNIQUE: Initially, axial CT images were obtained through the chest without intravenous contrast material administration. Multidetector CT imaging of the chest was performed using the standard protocol during bolus administration of intravenous contrast. Multiplanar CT image reconstructions and MIPs were obtained to evaluate the vascular anatomy. CONTRAST:  95mL ISOVUE-370 IOPAMIDOL (ISOVUE-370) INJECTION 76% COMPARISON:  Chest CT angiogram May 11, 2016; chest radiograph August 13, 2017 FINDINGS: Cardiovascular: On the noncontrast enhanced study, there is no intramural hematoma in the thoracic aorta. There is no appreciable thoracic aortic aneurysm or dissection. The visualized great vessels appear unremarkable. There are foci of atherosclerotic calcification in the aorta. There is right heart enlargement. There is no appreciable pericardial effusion. There is no appreciable pulmonary embolus. Mediastinum/Nodes: Thyroid appears unremarkable. There is no appreciable thoracic adenopathy. No esophageal lesions are evident.  Lungs/Pleura: There are areas of mild atelectatic change in the lingula and left lower lobe. There is no edema or consolidation. Areas of patchy mosaic opacity noted on prior CT are no longer appreciable. There is currently no pleural effusion or pleural thickening. Upper Abdomen: There is reflux of contrast into the inferior vena cava and hepatic veins. There are surgical clips in the gallbladder fossa. There are foci of atherosclerotic calcification in the aorta and proximal renal arteries. Musculoskeletal: There are no blastic or lytic bone lesions. Review of the MIP images confirms the above findings. IMPRESSION: 1. No thoracic aortic aneurysm or dissection. There are foci of atherosclerotic calcification in the aorta. 2.  No evident pulmonary embolus. 3. Cardiomegaly with right heart enlargement. Right atrium is in proportion more prominent than is the right ventricle. 4. Reflux of contrast in the inferior vena cava and hepatic veins suggests increase in right heart pressure. 5. Areas of mild atelectatic change in the lingula and left lower lobe. No lung edema or consolidation. 6.  No evident adenopathy. Aortic Atherosclerosis (ICD10-I70.0). Electronically Signed   By: Bretta Bang III M.D.   On: 07/25/2017 19:49   Scheduled Meds: . apixaban  5 mg Oral BID  . doxepin  25 mg Oral BID  . memantine  5 mg Oral Daily  . metoprolol succinate  25 mg Oral Daily  . pantoprazole  40 mg Oral  Q1200   Continuous Infusions:  Active Problems:   Chest pain  Time spent:   Standley Dakins, MD, FAAFP [facility] Pager [phone]  If 7PM-7AM, please contact night-coverage [email or web address] Password [email or web address] 08/04/2017, 2:41 PM    LOS: 0 days

## 2017-08-04 NOTE — Care Management Obs Status (Signed)
MEDICARE OBSERVATION STATUS NOTIFICATION   Patient Details  Name: Kelly [name] MRN: [id number] Date of Birth: 11/23/1940   Medicare Observation Status Notification Given:  Yes    Malcolm Metro, RN 08/04/2017, 11:24 AM

## 2017-08-04 NOTE — Consult Note (Signed)
The patient was seen and examined, and I agree with the history, physical exam, assessment and plan as documented by K. Lawrence DNP, with modifications as noted below. I have also personally reviewed all relevant documentation, old records, labs, and both radiographic and cardiovascular studies. I have also independently interpreted old and new ECG's.  76 yr old woman with h/o nonischemic cardiomyopathy (normal coronaries in 2002 by angiography), pulmonary hypertension, atrial fibrillation admitted with chest pain and progressive exertional dyspnea.  Chest pains initially occurred while lying down.  Upon speaking with the patient and her husband, she has had dysphagia for solids occasionally and sometimes pills get stuck in her throat. Her husband said that the used to walk 3-4 miles a day and now she only walks from one end of the house the other before getting short of breath.  This has been progressively getting worse over the last several months.  This has occurred since her last appointment with Dr. Antoine Poche.  Troponins 0.03 x 4.  Chest xray showed COPD.  CT angio chest showed no thoracic aortic aneursym/dissection. There was right heart enlargement with reflux of contrast into IVC and hepatic veins suggestive of elevated right heart pressures.  As per Dr. Jenene Slicker note, it was felt pulmonary hypertension was likely secondary to valvular heart disease and cardiomyopathy. She had been on sildenafil in the past which was subsequently discontinued.  Most recent echocardiogram 12/25/16: Mildly reduced LV systolic function, LVEF 40-45%, grade 1 diastolic dysfunction, indeterminate filling pressures, trivial MR, normal RV size, mildly reduced RV systolic function, mild TR, PASP 28 mmHg.  ECG here which I personally interpreted shows rapid atrial fib, 109 bpm, old inferior and anteroseptal infarct pattern.  ECG on 05/30/17 which I personally interpreted also demonstrated atrial fibrillation, 97  bpm.  ECG on 02/20/17 which I personally interpreted demonstrated sinus rhythm with PVCs.  Recommendations: It appears her primary issues relate to progressive exertional dyspnea rather than exertional chest pain. Troponins are minimally elevated and unremarkable.  She does have dysphagia for solids and some pill associated dysphagia and regurgitation.  Thus symptoms of chest pain may be more GI related.  I do not feel she warrants an inpatient stress test.  This could certainly be considered in the outpatient setting.  An echocardiogram has been performed and I will evaluate this to see if there has been worsening of her cardiomyopathy and/or progression of valvular heart disease.  Symptoms may be related to atrial fibrillation.  She was cardioverted in November 2017.   She has been on anticoagulation with Eliquis and has not missed any doses.   Symptoms may improve with cardioversion. Will make final recommendations after review of her echocardiogram. Left atrium was normal in size on 12/25/16, portending a more favorable chance of achieving sinus rhythm.   Prentice Docker, MD, [facility]  08/04/2017 9:52 AM

## 2017-08-04 NOTE — Consult Note (Signed)
CARDIOLOGY CONSULT NOTE    Patient ID: Kelly [name]; [id number]; 02/16/1941   Admit date: 08/11/2017 Date of Consult: 08/04/2017  Primary Care Provider: Mechele Claude, MD Primary Cardiologist: Rollene Rotunda, MD   Patient Profile:   Kelly [name] is a 76 y.o. female with a hx of nonischemic cardiomyopathy, most recent cardiac catheterization 2002 revealed normal coronary arteries, most recent nuclear medicine stress test in 2013 revealed normal perfusion.  Patient has an EF of 40%-45%, with diastolic dysfunction, other history includes atrial fibrillation with cardioversion November 2017 on Eliquis, HTN, hyperlipidemia,.  Who is being seen today for the evaluation of chest pain at the request of Dr. Laural Benes.   History of Present Illness:   Ms. Ohlinger presented to the emergency room with chest discomfort, having belching and acid reflux symptoms.  EKG revealed atrial fibrillation heart rate of 109 bpm with nonspecific ST abnormalities.  Troponin 0.03 x 2.  The patient has mild dementia some of the history is obtained from her husband as well.  She began to have chest discomfort and burping up 2 days ago causing her to sit on the side of the bed.  Lasted approximately 10-15 minutes.  Went away on its own.  The following day, the patient was reclining in bed and called out to her husband complaining that "something has to be done", with reported chest pressure burning and burping.  This lasted approximately 10 minutes.  These episodes were not associated with diaphoresis or dyspnea, no feelings of rapid heart rhythm or near syncope.  She also endorses dysphagia, trouble swallowing of food along with medications.  Feeling as if they get stuck.  Not been assessed by GI in the past.  Also, the patient endorses worsening dyspnea on exertion, walking less than 100 yards she begins to be more short of breath.  Her husband states that this has become more worrisome to them over the last  few months. Echocardiogram is currently being done during assessment.   On arrival to the emergency room patient's blood pressure is 115/79, heart rate 109 A. fib, O2 sat 94%, afebrile.  Patient was found to be hypokalemic with potassium of 3.2, creatinine 1.36.  Patient hemoglobin 14.0 hematocrit 44.5.  Chest x-ray revealed no evidence of aortic aneurysm or dissection, no evidence of CHF or pneumonia.  COPD is noted with cardiomegaly per chest x-ray.  Patient was treated with metoprolol 5 mg IV x1, aspirin 324 mg,.  She did become hypotensive after IV metoprolol.  We are asked for further recommendations in the setting of HFrEF with chest pain.   Past Medical History:  Diagnosis Date  . Dementia   . Depression with anxiety   . Dysrhythmia    AFib  . HLD (hyperlipidemia)   . HTN (hypertension)   . Migraine   . Mild cognitive impairment 04/2017  . NICM (nonischemic cardiomyopathy) (HCC)    a. LHC 8/02: EF 55%, 2+ MR, normal cors;  b.  Echo 3/10: EF 50-55%, mild diast dysfn, mild MVP (ant leaflet), mild MR, mild TR, small pericard eff.;   c.  Myoview 3/10:  EF 69%, no ischemia;  d. Echo 9/13: EF 50-55%, Gr 1 diast dysfn, mild prolapse of ant MV leaflet;   e. Myoview 9/13: EF 64%, NL perfusion;  f.  Echo (11/15):  EF 50-55%, normal diastolic function, mild MR  . Pulmonary HTN (HCC)   . Vertigo     Past Surgical History:  Procedure Laterality Date  . ABDOMINAL  HYSTERECTOMY    . APPENDECTOMY    . CARDIOVERSION N/A 07/05/2016   Procedure: CARDIOVERSION;  Surgeon: Jonelle Sidle, MD;  Location: [facility];  Service: Cardiovascular;  Laterality: N/A;  . CHOLECYSTECTOMY    . EYE SURGERY Bilateral    cataract removal with lens implants  . IR GENERIC HISTORICAL  05/13/2016   IR RADIOLOGY PERIPHERAL GUIDED IV START 05/13/2016 Malachy Moan, MD [facility]  . IR GENERIC HISTORICAL  05/13/2016   IR US GUIDE VASC ACCESS RIGHT 05/13/2016 Malachy Moan, MD [facility]     Home Medications:    Prior to Admission medications   Medication Sig Start Date End Date Taking? Authorizing Provider  apixaban (ELIQUIS) 5 MG TABS tablet Take 1 tablet (5 mg total) by mouth 2 (two) times daily. 05/05/17  Yes Rollene Rotunda, MD  doxepin (SINEQUAN) 25 MG capsule Take 1 capsule by mouth 2 (two) times daily. 07/31/17  Yes [provider]  memantine (NAMENDA) 5 MG tablet Take 1 tablet by mouth daily. 08/01/17  Yes [provider]  metoprolol succinate (TOPROL-XL) 25 MG 24 hr tablet Take 1 tablet (25 mg total) by mouth daily. 04/25/17  Yes Rollene Rotunda, MD    Inpatient Medications: Scheduled Meds: . apixaban  5 mg Oral BID  . doxepin  25 mg Oral BID  . memantine  5 mg Oral Daily  . metoprolol succinate  25 mg Oral Daily  . pantoprazole  40 mg Oral Q1200   Continuous Infusions: . sodium chloride 75 mL/hr at 30-Aug-2017 2320   PRN Meds: acetaminophen, hydrALAZINE, HYDROcodone-acetaminophen, ondansetron (ZOFRAN) IV  Allergies:   No Known Allergies  Social History:   Social History   Socioeconomic History  . Marital status: Married    Spouse name: Not on file  . Number of children: Not on file  . Years of education: Not on file  . Highest education level: Not on file  Social Needs  . Financial resource strain: Not on file  . Food insecurity - worry: Not on file  . Food insecurity - inability: Not on file  . Transportation needs - medical: Not on file  . Transportation needs - non-medical: Not on file  Occupational History  . Not on file  Tobacco Use  . Smoking status: Never Smoker  . Smokeless tobacco: Never Used  Substance and Sexual Activity  . Alcohol use: No  . Drug use: No  . Sexual activity: Yes    Birth control/protection: Surgical  Other Topics Concern  . Not on file  Social History Narrative  . Not on file    Family History:    Family History  Problem Relation Age of Onset  . Asthma Mother   . COPD Mother   . Heart disease Mother   .  Thrombosis Father   . Cancer Sister   . Heart disease Brother   . Heart attack Brother   . Stroke Neg Hx      ROS:  Please see the history of present illness.  ROS  All other ROS reviewed and negative.     Physical Exam/Data:   Vitals:   2017-08-30 2210 08-30-2017 2215 08/30/17 2300 08/04/17 0600  BP:   98/62 106/70  Pulse: (!) 36  62 96  Resp: (!) 22 18 16 16   Temp:   98.1 F (36.7 C) 98.3 F (36.8 C)  TempSrc:   Oral Oral  SpO2: 91%  96% 98%  Weight:   154 lb 6.4 oz (70 kg)  Height:   5\' 2"  (1.575 m)     Intake/Output Summary (Last 24 hours) at 08/04/2017 0836 Last data filed at 08/04/2017 0300 Gross per 24 hour  Intake 1275 ml  Output -  Net 1275 ml   Filed Weights   07/26/2017 1535 07/28/2017 2300  Weight: 154 lb (69.9 kg) 154 lb 6.4 oz (70 kg)   Body mass index is 28.24 kg/m.   General:  Well nourished, well developed, in no acute distress HEENT: normal Lymph: no adenopathy Neck: no JVD Endocrine:  No thryomegaly Vascular: No carotid bruits; FA pulses 2+ bilaterally without bruits  Cardiac:  normal S1, S2; IRRR; bradycardicno murmur  Lungs:  Clear to auscultation bilaterally, no wheezing, rhonchi or rales  Abd: soft, nontender, no hepatomegaly  Ext: no edema Musculoskeletal:  No deformities, BUE and BLE strength normal and equal Skin: warm and dry  Neuro:  CNs 2-12 intact, no focal abnormalities noted Psych:  Normal affect Mild confusion, picking at clothes and telemetry. Easily distracted.   EKG:  The EKG was personally reviewed and demonstrates: Fibrillation heart rate of 109 bpm with inferior ST abnormalities. Telemetry:  Telemetry was personally reviewed and demonstrates:  Atrial fib  Relevant CV Studies: Echocardiogram 12/25/2016 Left ventricle: The cavity size was normal. Systolic function was   mildly to moderately reduced. The estimated ejection fraction was   in the range of 40% to 45%. Mild diffuse hypokinesis. Doppler   parameters are consistent  with abnormal left ventricular   relaxation (grade 1 diastolic dysfunction). Doppler parameters   are consistent with indeterminate ventricular filling pressure. - Aortic valve: Transvalvular velocity was within the normal range.   There was no stenosis. There was no regurgitation. Valve area   (VTI): 1.24 cm^2. Valve area (Vmax): 1.43 cm^2. Valve area   (Vmean): 1.38 cm^2. - Mitral valve: Transvalvular velocity was within the normal range.   There was no evidence for stenosis. There was trivial   regurgitation. - Right ventricle: The cavity size was normal. Wall thickness was   normal. Systolic function was mildly reduced. - Tricuspid valve: There was mild regurgitation. - Pulmonary arteries: Systolic pressure was within the normal   range. PA peak pressure: 28 mm Hg (S).  Laboratory Data:  Chemistry Recent Labs  Lab 08/08/2017 1543 08/04/17 0445  NA 143 139  K 3.2* 4.1  CL 107 108  CO2 23 18*  GLUCOSE 147* 110*  BUN 28* 30*  CREATININE 1.36* 1.62*  CALCIUM 9.7 8.6*  GFRNONAA 36* 29*  GFRAA 42* 34*  ANIONGAP 13 13    Hematology Recent Labs  Lab 07/25/2017 1543  WBC 7.3  RBC 4.66  HGB 14.0  HCT 44.5  MCV 95.5  MCH 30.0  MCHC 31.5  RDW 15.3  PLT 258   Cardiac Enzymes Recent Labs  Lab 07/24/2017 1605 08/12/2017 1830 07/27/2017 2238 08/04/17 0445  TROPONINI 0.03* 0.03* 0.03* 0.03*   No results for input(s): TROPIPOC in the last 168 hours.  BNPNo results for input(s): BNP, PROBNP in the last 168 hours.  DDimer No results for input(s): DDIMER in the last 168 hours.  Radiology/Studies:  Dg Chest 2 View  Result Date: 08/12/2017 CLINICAL DATA:  Chest pain EXAM: CHEST  2 VIEW COMPARISON:  08/24/2016 FINDINGS: Cardiomegaly. There is hyperinflation of the lungs compatible with COPD. Heart and mediastinal contours are within normal limits. No focal opacities or effusions. No acute bony abnormality. IMPRESSION: Cardiomegaly.  COPD.  No active disease. Electronically Signed    By: Caryn Bee  Dover M.D.   On: 06/26/2017 16:24   Ct Angio Chest Aorta W And/or Wo Contrast  Result Date: 06/26/2017 CLINICAL DATA:  Shortness of breath and chest pain EXAM: CT ANGIOGRAPHY CHEST WITH CONTRAST TECHNIQUE: Initially, axial CT images were obtained through the chest without intravenous contrast material administration. Multidetector CT imaging of the chest was performed using the standard protocol during bolus administration of intravenous contrast. Multiplanar CT image reconstructions and MIPs were obtained to evaluate the vascular anatomy. CONTRAST:  70mL ISOVUE-370 IOPAMIDOL (ISOVUE-370) INJECTION 76% COMPARISON:  Chest CT angiogram May 11, 2016; chest radiograph August 13, 2017 FINDINGS: Cardiovascular: On the noncontrast enhanced study, there is no intramural hematoma in the thoracic aorta. There is no appreciable thoracic aortic aneurysm or dissection. The visualized great vessels appear unremarkable. There are foci of atherosclerotic calcification in the aorta. There is right heart enlargement. There is no appreciable pericardial effusion. There is no appreciable pulmonary embolus. Mediastinum/Nodes: Thyroid appears unremarkable. There is no appreciable thoracic adenopathy. No esophageal lesions are evident. Lungs/Pleura: There are areas of mild atelectatic change in the lingula and left lower lobe. There is no edema or consolidation. Areas of patchy mosaic opacity noted on prior CT are no longer appreciable. There is currently no pleural effusion or pleural thickening. Upper Abdomen: There is reflux of contrast into the inferior vena cava and hepatic veins. There are surgical clips in the gallbladder fossa. There are foci of atherosclerotic calcification in the aorta and proximal renal arteries. Musculoskeletal: There are no blastic or lytic bone lesions. Review of the MIP images confirms the above findings. IMPRESSION: 1. No thoracic aortic aneurysm or dissection. There are foci of  atherosclerotic calcification in the aorta. 2.  No evident pulmonary embolus. 3. Cardiomegaly with right heart enlargement. Right atrium is in proportion more prominent than is the right ventricle. 4. Reflux of contrast in the inferior vena cava and hepatic veins suggests increase in right heart pressure. 5. Areas of mild atelectatic change in the lingula and left lower lobe. No lung edema or consolidation. 6.  No evident adenopathy. Aortic Atherosclerosis (ICD10-I70.0). Electronically Signed   By: Bretta BangWilliam  Woodruff III M.D.   On: 06/26/2017 19:49    Assessment and Plan:   1.  Chest pain: Typical and atypical features.  Mostly associated with trying to swallow, but also in reclining position with associated chest pressure and burping, requiring her to sit up.  EKG and cardiac troponin are negative with low likelihood of ACS.   2. HFrEF: Right ventricular enlargement: No evidence of CHF on chest x-ray or fluid overload on exam.  Repeat echocardiogram is currently being conducted during evaluation and assessment.  His recent echocardiogram completed in May 2018 revealing ejection fraction of 40%-45%.  She is been having complaints of worsening breathing.  Uncertain if this is worsening of COPD versus reduced EF.  More recommendations after echo is read.  3.  Atrial fibrillation: Initially heart rate was elevated on admission.  Currently better controlled.  She remains on Eliquis 5 mg twice daily, aspirin 324 mg were provided today x1 dose only.  She continues on metoprolol succinate 25 mg daily.  4.  COPD: She does have been worsening symptoms of dyspnea on exertion.  I do not see that she is on any inhalers or has been seen by pulmonology.  This may need to be considered as an outpatient.  Oxygen saturations between 91% and 99 per on room air.  5.  Mild dementia: Difficulties with memory, trouble concentrating.  For questions or updates, please contact [facility] Please consult [email or web address]  for contact info under Cardiology/STEMI.   Signed, Bettey Mare. Liborio Nixon, ANP, AACC  08/04/2017 8:36 AM   The patient was seen and examined, and I agree with the history, physical exam, assessment and plan as documented above, with modifications as noted below. I have also personally reviewed all relevant documentation, old records, labs, and both radiographic and cardiovascular studies. I have also independently interpreted old and new ECG's.  76 yr old woman with h/o nonischemic cardiomyopathy (normal coronaries in 2002 by angiography), pulmonary hypertension, atrial fibrillation admitted with chest pain and progressive exertional dyspnea.  Chest pains initially occurred while lying down.  Upon speaking with the patient and her husband, she has had dysphagia for solids occasionally and sometimes pills get stuck in her throat. Her husband said that the used to walk 3-4 miles a day and now she only walks from one end of the house the other before getting short of breath.  This has been progressively getting worse over the last several months.  This has occurred since her last appointment with Dr. Antoine Poche.  Troponins 0.03 x 4.  Chest xray showed COPD.  CT angio chest showed no thoracic aortic aneursym/dissection. There was right heart enlargement with reflux of contrast into IVC and hepatic veins suggestive of elevated right heart pressures.  As per Dr. Jenene Slicker note, it was felt pulmonary hypertension was likely secondary to valvular heart disease and cardiomyopathy. She had been on sildenafil in the past which was subsequently discontinued.  Most recent echocardiogram 12/25/16: Mildly reduced LV systolic function, LVEF 40-45%, grade 1 diastolic dysfunction, indeterminate filling pressures, trivial MR, normal RV size, mildly reduced RV systolic function, mild TR, PASP 28 mmHg.  ECG here which I personally interpreted shows rapid atrial fib, 109 bpm, old inferior and anteroseptal  infarct pattern.  ECG on 05/30/17 which I personally interpreted also demonstrated atrial fibrillation, 97 bpm.  ECG on 02/20/17 which I personally interpreted demonstrated sinus rhythm with PVCs.  Recommendations: It appears her primary issues relate to progressive exertional dyspnea rather than exertional chest pain. Troponins are minimally elevated and unremarkable.  She does have dysphagia for solids and some pill associated dysphagia and regurgitation.  Thus symptoms of chest pain may be more GI related.  I do not feel she warrants an inpatient stress test.  This could certainly be considered in the outpatient setting.  An echocardiogram has been performed and I will evaluate this to see if there has been worsening of her cardiomyopathy and/or progression of valvular heart disease to see if this is the cause of worsening exertional dyspnea.  Again, as per Dr. Jenene Slicker note, it was felt pulmonary hypertension was likely secondary to valvular heart disease and cardiomyopathy. She had been on sildenafil in the past which was subsequently discontinued.  Symptoms may be related to atrial fibrillation.  She was cardioverted in November 2017.   She has been on anticoagulation with Eliquis and has not missed any doses.   Symptoms may improve with cardioversion. Will make final recommendations after review of her echocardiogram. Left atrium was normal in size on 12/25/16, portending a more favorable chance of achieving sinus rhythm.   Prentice Docker, MD, [facility]  08/04/2017 10:24 AM

## 2017-08-04 NOTE — Progress Notes (Signed)
*  PRELIMINARY RESULTS* Echocardiogram 2D Echocardiogram has been performed.  Stacey Drain 08/04/2017, 10:00 AM

## 2017-08-05 ENCOUNTER — Observation Stay (HOSPITAL_COMMUNITY): Payer: Medicare Other

## 2017-08-05 DIAGNOSIS — I700 Atherosclerosis of aorta: Secondary | ICD-10-CM

## 2017-08-05 DIAGNOSIS — R0689 Other abnormalities of breathing: Secondary | ICD-10-CM

## 2017-08-05 DIAGNOSIS — R0600 Dyspnea, unspecified: Secondary | ICD-10-CM | POA: Diagnosis not present

## 2017-08-05 LAB — MRSA PCR SCREENING: MRSA BY PCR: NEGATIVE

## 2017-08-05 MED ORDER — MORPHINE SULFATE (PF) 2 MG/ML IV SOLN: 2.0000 mg | INTRAVENOUS | Status: DC | PRN

## 2017-08-05 MED ORDER — MORPHINE SULFATE (PF) 2 MG/ML IV SOLN
INTRAVENOUS | Status: AC
  Administered 2017-08-05: 1 mg via INTRAVENOUS
  Filled 2017-08-05: qty 1

## 2017-08-05 MED ORDER — MORPHINE SULFATE (PF) 2 MG/ML IV SOLN: 1.0000 mg | Freq: Once | INTRAVENOUS | Status: AC

## 2017-08-05 MED ORDER — LORAZEPAM 2 MG/ML IJ SOLN: 1.0000 mg | INTRAMUSCULAR | Status: DC | PRN

## 2017-08-05 MED ORDER — DILTIAZEM HCL-DEXTROSE 100-5 MG/100ML-% IV SOLN (PREMIX)
5.0000 mg/h | INTRAVENOUS | Status: DC
  Administered 2017-08-05: 5 mg/h via INTRAVENOUS
  Filled 2017-08-05: qty 100

## 2017-08-14 ENCOUNTER — Ambulatory Visit: Payer: Medicare Other | Admitting: Family Medicine

## 2017-08-19 NOTE — Progress Notes (Signed)
Unable to obtain ABG. Dr. Sharl Ma at bedside and aware

## 2017-08-19 NOTE — Progress Notes (Addendum)
Called by RN, the patient continues to be in respiratory distress, with labored breathing. Blood pressure dropped to 50s. Patient was started on IV Cardizem for atrial fibrillation  Patient seen and examined continues to have labored breathing on non-rebreather, O2 sats in 80s, blood pressure fluctuating from 60s to 80s systolic. On exam-lungs clear to auscultation *chest x-ray obtained, no pulmonary edema seen on x-ray(reviewed by myself), official report pending ABG could not be obtained patient is a DNR  I discussed in detail regarding patients critical condition with her husband at bedside, at this time don't want to pursue any aggressive measures. I will make patient comfort care.  Start morphine 2 mg IV Q2 hours PRN for dyspnea Ativan 1 mg IV Q4 hours PRN for agitation/anxiety  Anticipate hospital death  Critical care time 35 minutes

## 2017-08-19 NOTE — Progress Notes (Addendum)
Pt family has visited with body and went home. Pts body was prepared for funeral home and patient placement was notified.  Funeral home picked up patient at [phone]

## 2017-08-19 NOTE — Progress Notes (Signed)
Entered room to see patient and attempt to give night time meds. Patient at beginning of shift was resting comfortably, in no distress no complaints. Husband stated that patient had been agitated earlier, but had been resting this evening.   Upon entering room, patient was in respiratory distress, tachypneic with labored breathing and was in a.fib on the monitor.  Patient was alert, but not responding appropriately.  Patient placed on oxygen at 2L, but unable to retrive o2 sat.  Respiratory called and patient on non-rebreather, and supervisor called to start an IV.  Staff was still unable to retrieve an oxygen saturation.  Notified MD on call and received order for transfer to higher level of care for cardizem drip.  Gave report to ICU nurse.  Husband at patients side entire time.  Explained to husband that patient situation was tenuous.  He verbalized understanding.  Patient transferred to ICU.

## 2017-08-19 NOTE — Discharge Summary (Signed)
Expiration Note  Kelly [name]  MR#: [id number]  DOB:1941/02/01  Date of Admission: Aug 26, 2017 Date of Death: 2017-08-27  Attending Physician:Jonathen Rathman Laural Benes  Patient's PCP: Mechele Claude, MD  Consults: Treatment Team:  Laqueta Linden, MD  Cause of Death: Chest pain Respiratory failure  Secondary Diagnoses Present on Admission: . Chest pain . HLD (hyperlipidemia) . Pulmonary HTN (HCC) . Dementia . Essential hypertension . Aortic atherosclerosis (HCC) . Hypokalemia . Elevated troponin  Hospital Course:  HPI:   Kelly [name]  is a 77 y.o. female, with history of atrial fibrillation on anti-coagulation with eliquis, non-ischemic cardiomyopathy if 40 of 45% as per Echo in May 2018 with grade one diastolic dysfunction, dementia, came to hospital with chest pain. Chest pain episodes have been intermittent and are associated with shortness of breath. Patient also has dyspnea on exertion. Patient has been belching a lot and has had poor PO intake for past few days. Chest pain lasts only for a few minutes. She complains of nausea but no vomiting. She denies abdominal pain. No dysuria or urgency or frequency of urination. No headache or blurred vision. Did not pass out.  In the ED, she was found to have mild elevation of troponin .03. Patient became hypotensive after she received IV metoprolol, CTA chest was done which was negative for aortic dissection.Cardiomegaly with right heart enlargement. Right atrium is in proportion more prominent than is the right ventricle. Notes from Pt's hospital course ending in death Entered room to see patient and attempt to give night time meds. Patient at beginning of shift was resting comfortably, in no distress no complaints. Husband stated that patient had been agitated earlier, but had been resting this evening.   Upon entering room, patient was in respiratory distress, tachypneic with labored breathing and was in a.fib on the  monitor.  Patient was alert, but not responding appropriately.  Patient placed on oxygen at 2L, but unable to retrive o2 sat.  Respiratory called and patient on non-rebreather, and supervisor called to start an IV.  Staff was still unable to retrieve an oxygen saturation.  Notified MD on call and received order for transfer to higher level of care for cardizem drip.  Gave report to ICU nurse.  Husband at patients side entire time.  Explained to husband that patient situation was tenuous.  He verbalized understanding.  Patient transferred to ICU.  Called by RN, the patient continues to be in respiratory distress, with labored breathing. Blood pressure dropped to 50s. Patient was started on IV Cardizem for atrial fibrillation  Patient seen and examined continues to have labored breathing on non-rebreather, O2 sats in 80s, blood pressure fluctuating from 60s to 80s systolic. On exam-lungs clear to auscultation chest x-ray obtained, no pulmonary edema seen on x-ray (reviewed by myself), official report pending ABG could not be obtained patient is a DNR  I discussed in detail regarding patients critical condition with her husband at bedside, at this time don't want to pursue any aggressive measures. I will make patient comfort care.  Started morphine 2 mg IV Q2 hours PRN for dyspnea Ativan 1 mg IV Q4 hours PRN for agitation/anxiety  Anticipate hospital death  Signed: Abdullahi Vallone 08/10/2017, 7:24 AM

## 2017-08-19 DEATH — deceased

## 2017-08-29 ENCOUNTER — Ambulatory Visit: Payer: Medicare Other | Admitting: Family Medicine

## 2017-09-23 ENCOUNTER — Ambulatory Visit: Payer: Medicare Other | Admitting: Neurology

## 2017-12-29 IMAGING — DX DG CHEST 2V
2 series · 2 of 2 positions shown · non-contrast
Comparison: 05/10/2016

CLINICAL DATA: Awakened this morning by chest pain and dyspnea

EXAM:
CHEST  2 VIEW

[chest pa]
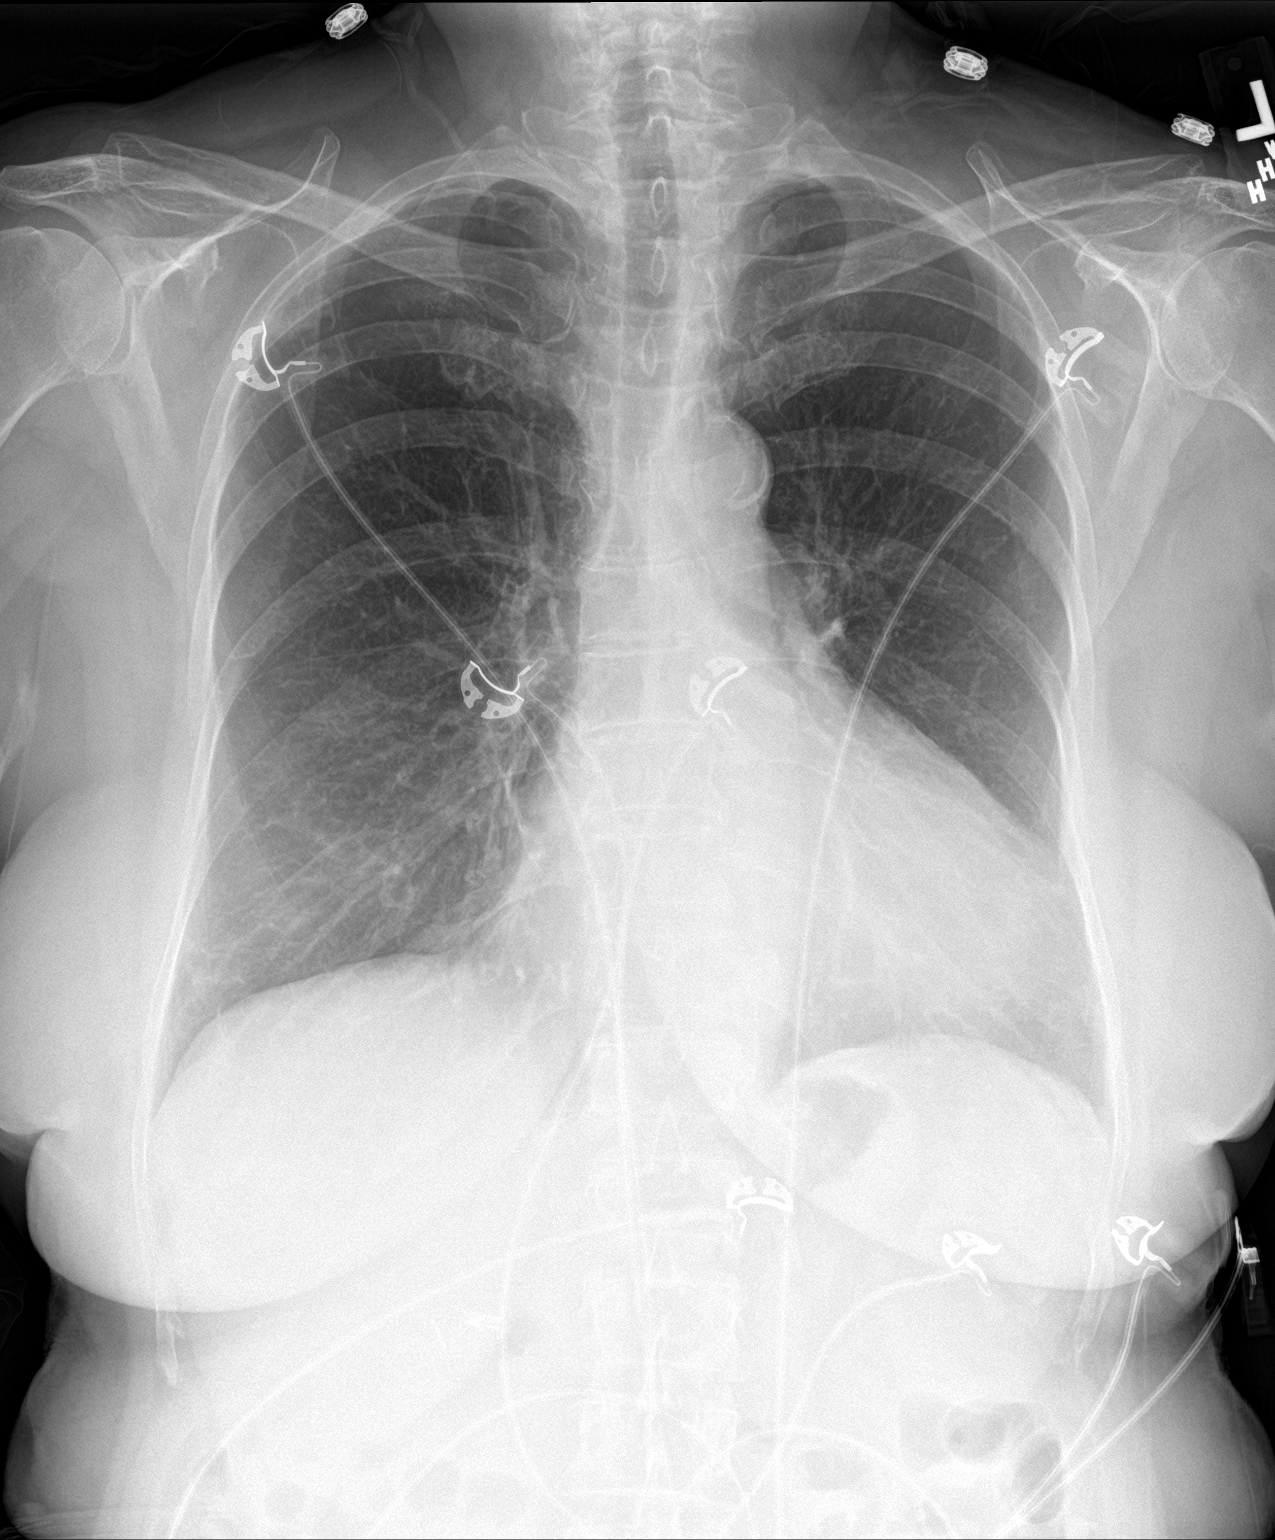

[chest lat]
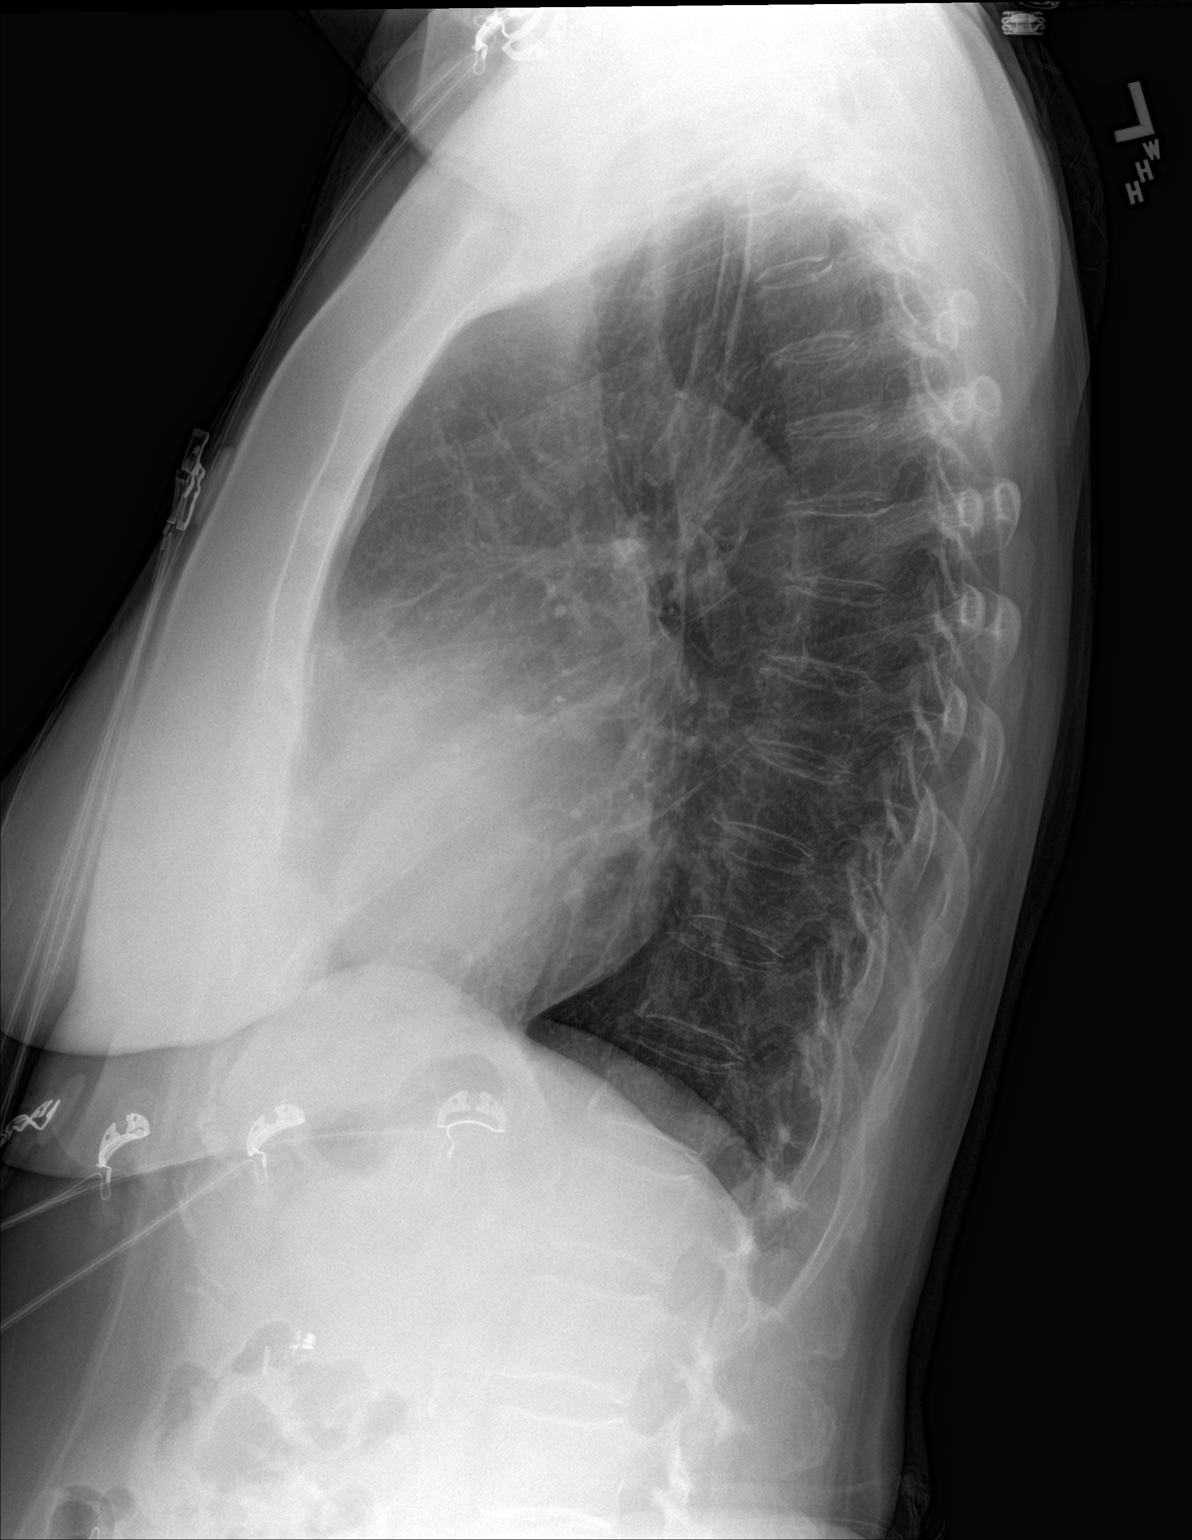

[2 of 2 positions shown; findings below may reference images not displayed]

FINDINGS: Moderate cardiomegaly, unchanged. The lungs are clear. The pulmonary
vasculature is normal. There is no pleural effusion. Hilar and
mediastinal contours are unremarkable and unchanged.
IMPRESSION: Stable cardiomegaly. No consolidation or effusion. Normal
vasculature.

## 2019-08-23 IMAGING — MR MR HEAD W/O CM
6 of 10 series · 30 of 48 positions shown · non-contrast
Comparison: 05/30/2017 MRI of head and CT of head.

CLINICAL DATA: 76 y/o  F; weakness and dizziness.

EXAM:
MRI HEAD WITHOUT CONTRAST
TECHNIQUE: Multiplanar, multiecho pulse sequences of the brain and surrounding
structures were obtained without intravenous contrast.

[Series 3: DWI · axial · 3.0mm · 0.82mm/px · z∈[-107,+54]mm · 7 of 55 slices shown (1 of 4)]
[im 1/55]
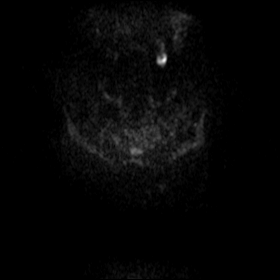
[im 10/55]
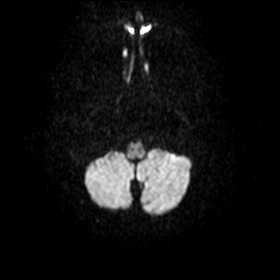
[im 19/55]
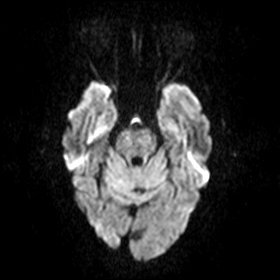
[im 28/55]
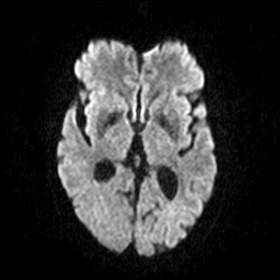
[im 37/55]
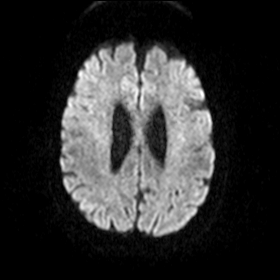
[im 46/55]
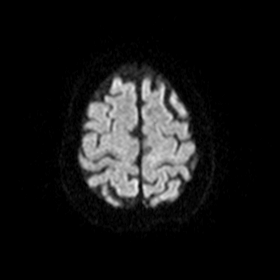
[im 55/55]
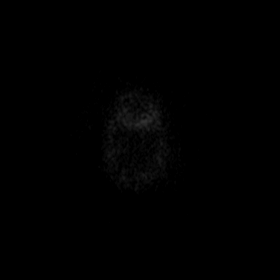

[Series 4: DWI · axial · 3.0mm · 0.82mm/px · z∈[-107,+54]mm · 6 of 55 slices shown (2 of 4)]
[im 1/55]
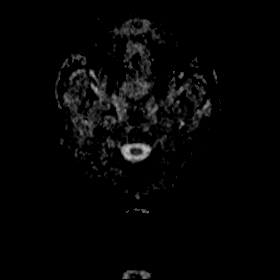
[im 11/55]
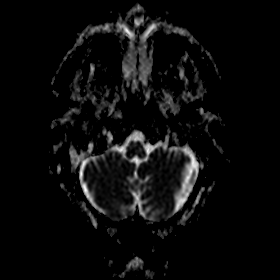
[im 22/55]
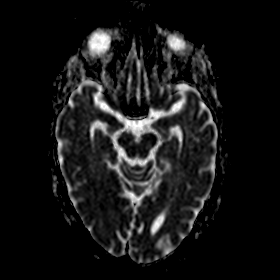
[im 33/55]
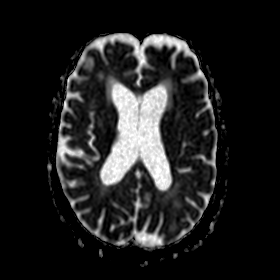
[im 44/55]
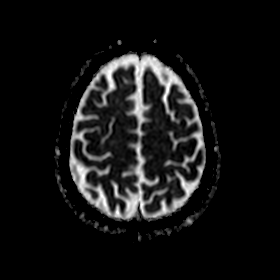
[im 55/55]
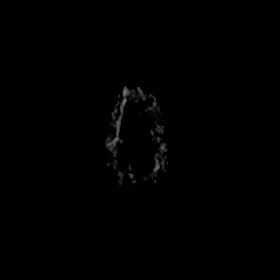

[Series 5: DWI · coronal · 5.0mm · 0.50mm/px · 4 of 38 slices shown (3 of 4)]
[im 1/38]
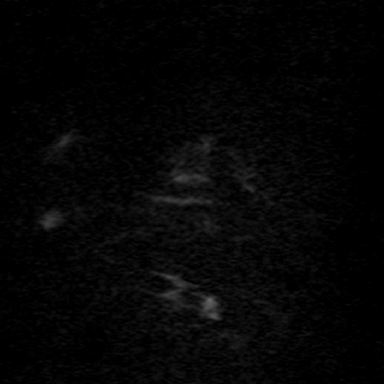
[im 13/38]
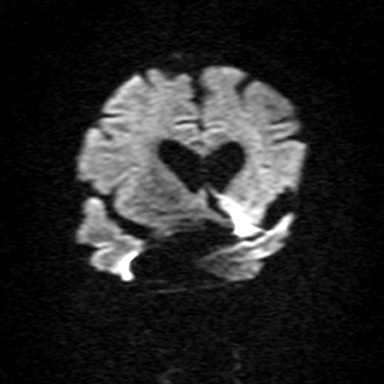
[im 25/38]
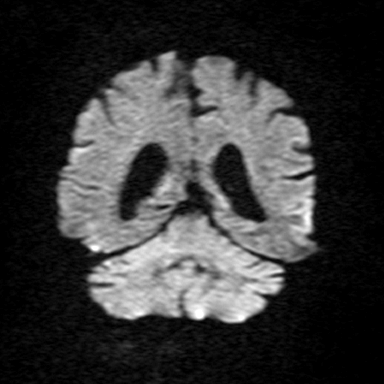
[im 38/38]
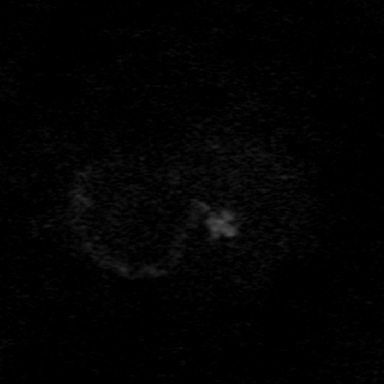

[Series 6: DWI · coronal · 5.0mm · 0.50mm/px · 4 of 38 slices shown (4 of 4)]
[im 1/38]
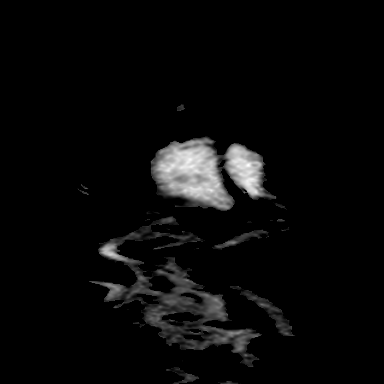
[im 13/38]
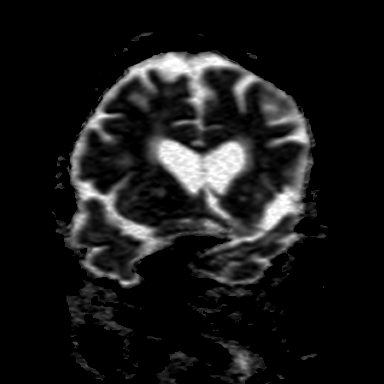
[im 25/38]
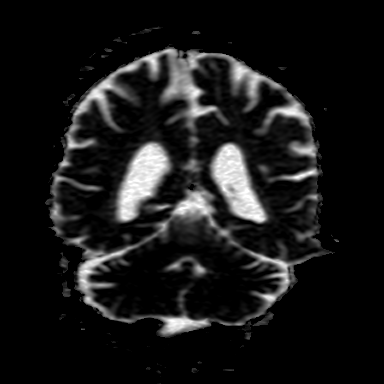
[im 38/38]
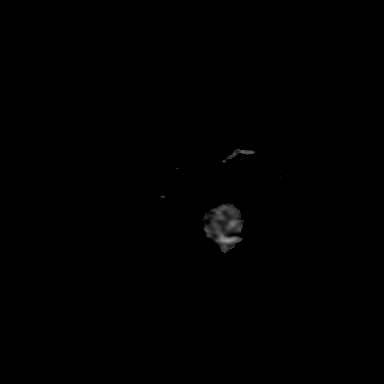

[Series 8: T2 · axial · 5.0mm · 0.51mm/px · z∈[-98,+44]mm · 3 of 23 slices shown]
[im 1/23]
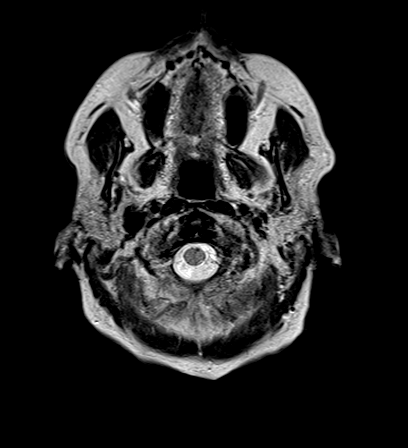
[im 12/23]
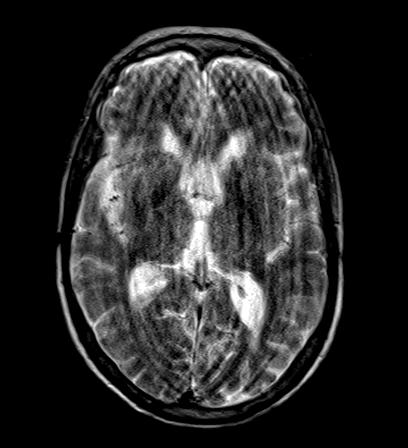
[im 23/23]
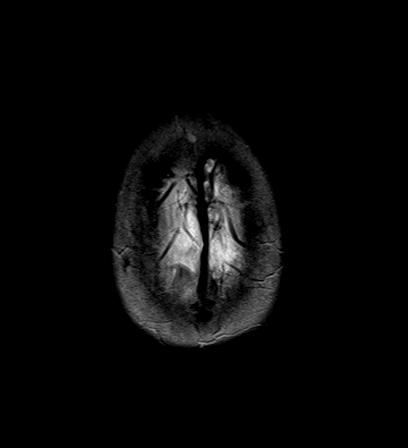

[Series 9: FLAIR · axial · 3.0mm · 0.37mm/px · z∈[-102,+50]mm · 6 of 52 slices shown]
[im 1/52]
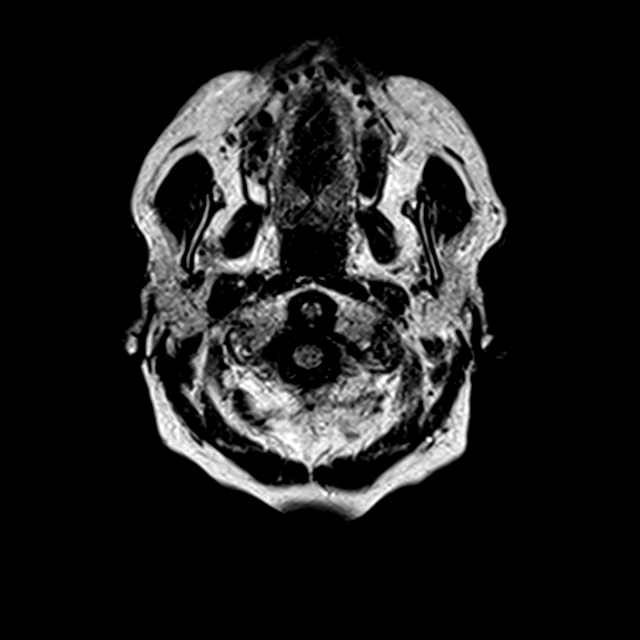
[im 11/52]
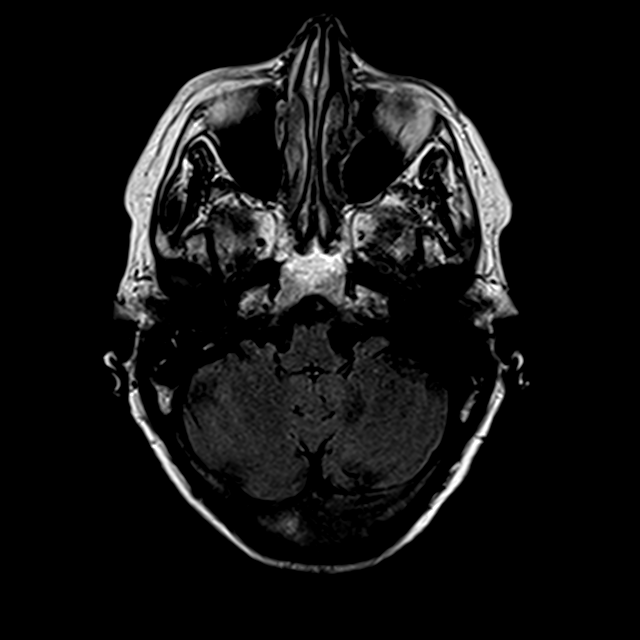
[im 21/52]
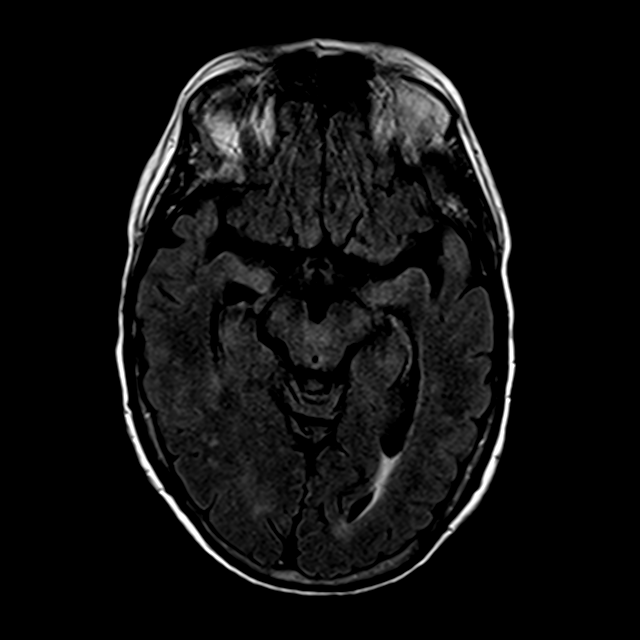
[im 31/52]
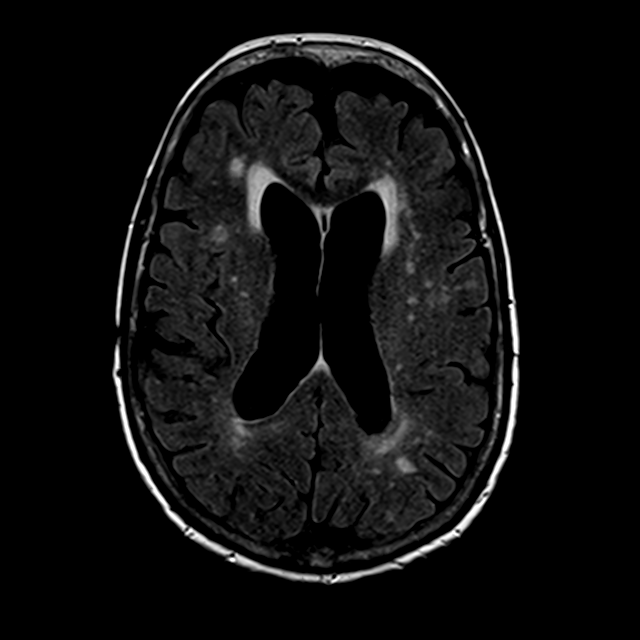
[im 41/52]
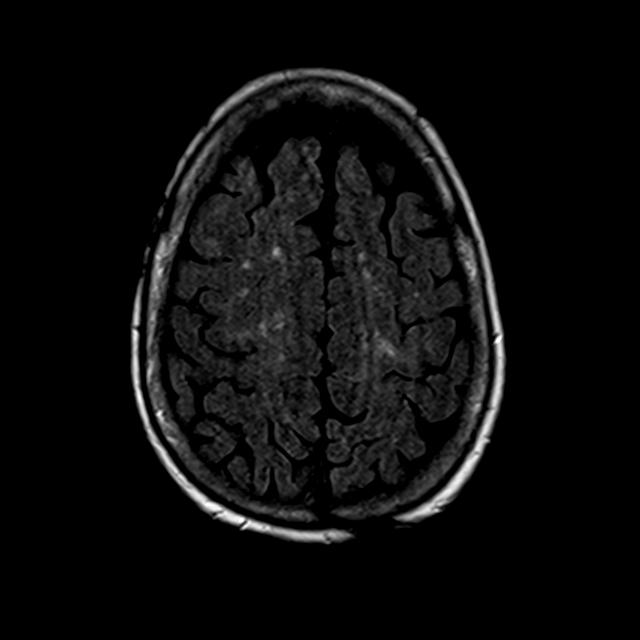
[im 52/52]
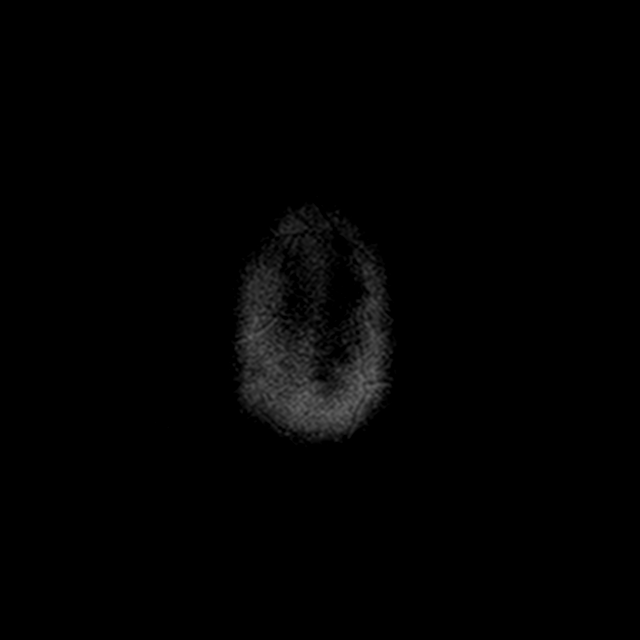

[30 of 48 positions shown; findings below may reference images not displayed]

FINDINGS: Brain: Motion degradation on multiple sequences. No acute
infarction, hemorrhage, hydrocephalus, extra-axial collection or
mass lesion. Several stable nonspecific foci of T2 FLAIR
hyperintense signal abnormality in subcortical and periventricular
white matter are compatible with moderate chronic microvascular
ischemic changes for age. Moderate brain parenchymal volume loss.

Vascular: Normal flow voids.

Skull and upper cervical spine: Normal marrow signal.

Sinuses/Orbits: Negative.

Other: None.
IMPRESSION: 1. Motion degraded study.
2. No acute intracranial abnormality identified.
3. Stable moderate chronic microvascular ischemic changes and
parenchymal volume loss of the brain.

By: Kubek Preisner M.D.
